# Patient Record
Sex: Female | Born: 1960
Health system: Southern US, Community
[De-identification: ages and names within clinical notes are randomized; demographics above are authoritative.]

## PROBLEM LIST (undated history)

## (undated) ENCOUNTER — Emergency Department (HOSPITAL_COMMUNITY): Admission: EM | Payer: BC Managed Care – PPO | Source: Home / Self Care

## (undated) DIAGNOSIS — F419 Anxiety disorder, unspecified: Secondary | ICD-10-CM

## (undated) DIAGNOSIS — F32A Depression, unspecified: Secondary | ICD-10-CM

## (undated) DIAGNOSIS — E785 Hyperlipidemia, unspecified: Secondary | ICD-10-CM

## (undated) DIAGNOSIS — I10 Essential (primary) hypertension: Secondary | ICD-10-CM

## (undated) DIAGNOSIS — N189 Chronic kidney disease, unspecified: Secondary | ICD-10-CM

## (undated) HISTORY — DX: Essential (primary) hypertension: I10

## (undated) HISTORY — PX: OOPHORECTOMY: SHX86

## (undated) HISTORY — PX: CARPAL TUNNEL RELEASE: SHX101

## (undated) HISTORY — DX: Chronic kidney disease, unspecified: N18.9

## (undated) HISTORY — PX: FOOT SURGERY: SHX648

## (undated) HISTORY — DX: Anxiety disorder, unspecified: F41.9

## (undated) HISTORY — PX: APPENDECTOMY: SHX54

## (undated) HISTORY — DX: Hyperlipidemia, unspecified: E78.5

---

## 1998-10-31 ENCOUNTER — Observation Stay (HOSPITAL_COMMUNITY): Admission: RE | Admit: 1998-10-31 | Discharge: 1998-11-01 | Payer: Self-pay | Admitting: Obstetrics and Gynecology

## 1998-10-31 ENCOUNTER — Encounter (INDEPENDENT_AMBULATORY_CARE_PROVIDER_SITE_OTHER): Payer: Self-pay

## 1999-10-29 ENCOUNTER — Other Ambulatory Visit: Admission: RE | Admit: 1999-10-29 | Discharge: 1999-10-29 | Payer: Self-pay | Admitting: Obstetrics and Gynecology

## 2000-11-03 ENCOUNTER — Other Ambulatory Visit: Admission: RE | Admit: 2000-11-03 | Discharge: 2000-11-03 | Payer: Self-pay | Admitting: Obstetrics and Gynecology

## 2000-12-30 ENCOUNTER — Ambulatory Visit (HOSPITAL_COMMUNITY): Admission: RE | Admit: 2000-12-30 | Discharge: 2000-12-30 | Payer: Self-pay | Admitting: Obstetrics and Gynecology

## 2000-12-30 ENCOUNTER — Encounter (INDEPENDENT_AMBULATORY_CARE_PROVIDER_SITE_OTHER): Payer: Self-pay | Admitting: Specialist

## 2001-11-10 ENCOUNTER — Other Ambulatory Visit: Admission: RE | Admit: 2001-11-10 | Discharge: 2001-11-10 | Payer: Self-pay | Admitting: Obstetrics and Gynecology

## 2002-11-16 ENCOUNTER — Other Ambulatory Visit: Admission: RE | Admit: 2002-11-16 | Discharge: 2002-11-16 | Payer: Self-pay | Admitting: Obstetrics and Gynecology

## 2004-01-10 ENCOUNTER — Other Ambulatory Visit: Admission: RE | Admit: 2004-01-10 | Discharge: 2004-01-10 | Payer: Self-pay | Admitting: Obstetrics and Gynecology

## 2004-12-28 ENCOUNTER — Ambulatory Visit (HOSPITAL_COMMUNITY): Admission: RE | Admit: 2004-12-28 | Discharge: 2004-12-28 | Payer: Self-pay | Admitting: Obstetrics and Gynecology

## 2005-06-17 ENCOUNTER — Other Ambulatory Visit: Admission: RE | Admit: 2005-06-17 | Discharge: 2005-06-17 | Payer: Self-pay | Admitting: Obstetrics and Gynecology

## 2006-06-16 ENCOUNTER — Other Ambulatory Visit: Admission: RE | Admit: 2006-06-16 | Discharge: 2006-06-16 | Payer: Self-pay | Admitting: Obstetrics and Gynecology

## 2006-07-04 ENCOUNTER — Ambulatory Visit: Payer: Self-pay | Admitting: Internal Medicine

## 2006-07-04 DIAGNOSIS — R030 Elevated blood-pressure reading, without diagnosis of hypertension: Secondary | ICD-10-CM | POA: Insufficient documentation

## 2006-07-04 DIAGNOSIS — G43909 Migraine, unspecified, not intractable, without status migrainosus: Secondary | ICD-10-CM | POA: Insufficient documentation

## 2006-08-04 ENCOUNTER — Ambulatory Visit: Payer: Self-pay | Admitting: Internal Medicine

## 2006-08-07 LAB — CONVERTED CEMR LAB
BUN: 11 mg/dL (ref 6–23)
Basophils Absolute: 0 10*3/uL (ref 0.0–0.1)
Basophils Relative: 0.4 % (ref 0.0–1.0)
CO2: 29 meq/L (ref 19–32)
Calcium: 9.3 mg/dL (ref 8.4–10.5)
Chloride: 110 meq/L (ref 96–112)
Cholesterol: 221 mg/dL (ref 0–200)
Creatinine, Ser: 1 mg/dL (ref 0.4–1.2)
Direct LDL: 150.7 mg/dL
Eosinophils Absolute: 0.3 10*3/uL (ref 0.0–0.6)
Eosinophils Relative: 5 % (ref 0.0–5.0)
GFR calc Af Amer: 77 mL/min
GFR calc non Af Amer: 63 mL/min
Glucose, Bld: 100 mg/dL — ABNORMAL HIGH (ref 70–99)
HCT: 41.7 % (ref 36.0–46.0)
HDL: 51.1 mg/dL (ref >39.0)
Hemoglobin: 13.8 g/dL (ref 12.0–15.0)
Lymphocytes Relative: 33.4 % (ref 12.0–46.0)
MCHC: 33.2 g/dL (ref 30.0–36.0)
MCV: 93.9 fL (ref 78.0–100.0)
Monocytes Absolute: 0.4 10*3/uL (ref 0.2–0.7)
Monocytes Relative: 7.6 % (ref 3.0–11.0)
Neutro Abs: 3.2 10*3/uL (ref 1.4–7.7)
Neutrophils Relative %: 53.6 % (ref 43.0–77.0)
Platelets: 337 10*3/uL (ref 150–400)
Potassium: 4.4 meq/L (ref 3.5–5.1)
RBC: 4.44 M/uL (ref 3.87–5.11)
RDW: 11.6 % (ref 11.5–14.6)
Sodium: 140 meq/L (ref 135–145)
TSH: 2.18 microintl units/mL (ref 0.35–5.50)
Total CHOL/HDL Ratio: 4.3
Triglycerides: 144 mg/dL (ref 0–149)
VLDL: 29 mg/dL (ref 0–40)
WBC: 5.9 10*3/uL (ref 4.5–10.5)

## 2007-07-01 ENCOUNTER — Ambulatory Visit: Payer: Self-pay | Admitting: Internal Medicine

## 2007-07-01 DIAGNOSIS — R3589 Other polyuria: Secondary | ICD-10-CM | POA: Insufficient documentation

## 2007-07-01 DIAGNOSIS — M549 Dorsalgia, unspecified: Secondary | ICD-10-CM | POA: Insufficient documentation

## 2007-07-01 DIAGNOSIS — R358 Other polyuria: Secondary | ICD-10-CM

## 2007-07-01 LAB — CONVERTED CEMR LAB
Bilirubin Urine: NEGATIVE
Blood in Urine, dipstick: NEGATIVE
Glucose, Urine, Semiquant: NEGATIVE
Ketones, urine, test strip: NEGATIVE
Nitrite: NEGATIVE
Protein, U semiquant: NEGATIVE
Specific Gravity, Urine: 1.01
Urobilinogen, UA: 0.2
WBC Urine, dipstick: NEGATIVE
pH: 6

## 2007-07-02 ENCOUNTER — Ambulatory Visit: Payer: Self-pay | Admitting: Internal Medicine

## 2007-07-03 LAB — CONVERTED CEMR LAB
BUN: 12 mg/dL (ref 6–23)
Basophils Absolute: 0 10*3/uL (ref 0.0–0.1)
Basophils Relative: 0.2 % (ref 0.0–1.0)
CO2: 29 meq/L (ref 19–32)
Calcium: 9 mg/dL (ref 8.4–10.5)
Chloride: 100 meq/L (ref 96–112)
Cholesterol: 206 mg/dL (ref 0–200)
Creatinine, Ser: 1.1 mg/dL (ref 0.4–1.2)
Direct LDL: 121.7 mg/dL
Eosinophils Absolute: 0.7 10*3/uL (ref 0.0–0.7)
Eosinophils Relative: 3.9 % (ref 0.0–5.0)
GFR calc Af Amer: 69 mL/min
GFR calc non Af Amer: 57 mL/min
Glucose, Bld: 84 mg/dL (ref 70–99)
HCT: 44.4 % (ref 36.0–46.0)
HDL: 56.6 mg/dL (ref >39.0)
Hemoglobin: 15.4 g/dL — ABNORMAL HIGH (ref 12.0–15.0)
Hgb A1c MFr Bld: 5.5 % (ref 4.6–6.0)
Lymphocytes Relative: 23.6 % (ref 12.0–46.0)
MCHC: 34.8 g/dL (ref 30.0–36.0)
MCV: 92.7 fL (ref 78.0–100.0)
Monocytes Absolute: 1.6 10*3/uL — ABNORMAL HIGH (ref 0.1–1.0)
Monocytes Relative: 9.3 % (ref 3.0–12.0)
Neutro Abs: 10.5 10*3/uL — ABNORMAL HIGH (ref 1.4–7.7)
Neutrophils Relative %: 63 % (ref 43.0–77.0)
Platelets: 358 10*3/uL (ref 150–400)
Potassium: 3.7 meq/L (ref 3.5–5.1)
RBC: 4.79 M/uL (ref 3.87–5.11)
RDW: 11.9 % (ref 11.5–14.6)
Sodium: 138 meq/L (ref 135–145)
TSH: 3.37 microintl units/mL (ref 0.35–5.50)
Total CHOL/HDL Ratio: 3.6
Triglycerides: 152 mg/dL — ABNORMAL HIGH (ref 0–149)
VLDL: 30 mg/dL (ref 0–40)
WBC: 16.8 10*3/uL — ABNORMAL HIGH (ref 4.5–10.5)

## 2007-07-20 ENCOUNTER — Telehealth (INDEPENDENT_AMBULATORY_CARE_PROVIDER_SITE_OTHER): Payer: Self-pay | Admitting: *Deleted

## 2007-07-24 ENCOUNTER — Encounter (INDEPENDENT_AMBULATORY_CARE_PROVIDER_SITE_OTHER): Payer: Self-pay | Admitting: *Deleted

## 2007-08-13 ENCOUNTER — Ambulatory Visit: Payer: Self-pay | Admitting: Internal Medicine

## 2007-08-13 DIAGNOSIS — D72829 Elevated white blood cell count, unspecified: Secondary | ICD-10-CM | POA: Insufficient documentation

## 2007-08-13 DIAGNOSIS — M6530 Trigger finger, unspecified finger: Secondary | ICD-10-CM | POA: Insufficient documentation

## 2007-08-14 LAB — CONVERTED CEMR LAB
Basophils Absolute: 0 10*3/uL (ref 0.0–0.1)
Basophils Relative: 0.6 % (ref 0.0–3.0)
Eosinophils Absolute: 0.1 10*3/uL (ref 0.0–0.7)
Eosinophils Relative: 2 % (ref 0.0–5.0)
HCT: 40.3 % (ref 36.0–46.0)
Hemoglobin: 13.9 g/dL (ref 12.0–15.0)
Lymphocytes Relative: 34.3 % (ref 12.0–46.0)
MCHC: 34.4 g/dL (ref 30.0–36.0)
MCV: 91.7 fL (ref 78.0–100.0)
Monocytes Absolute: 0.4 10*3/uL (ref 0.1–1.0)
Monocytes Relative: 7.8 % (ref 3.0–12.0)
Neutro Abs: 3 10*3/uL (ref 1.4–7.7)
Neutrophils Relative %: 55.3 % (ref 43.0–77.0)
Platelets: 287 10*3/uL (ref 150–400)
RBC: 4.39 M/uL (ref 3.87–5.11)
RDW: 12.1 % (ref 11.5–14.6)
WBC: 5.4 10*3/uL (ref 4.5–10.5)

## 2007-08-27 ENCOUNTER — Emergency Department (HOSPITAL_COMMUNITY): Admission: EM | Admit: 2007-08-27 | Discharge: 2007-08-28 | Payer: Self-pay | Admitting: Emergency Medicine

## 2007-08-28 ENCOUNTER — Ambulatory Visit: Payer: Self-pay | Admitting: Family Medicine

## 2007-08-28 DIAGNOSIS — F4320 Adjustment disorder, unspecified: Secondary | ICD-10-CM | POA: Insufficient documentation

## 2007-08-28 DIAGNOSIS — I10 Essential (primary) hypertension: Secondary | ICD-10-CM | POA: Insufficient documentation

## 2007-09-02 ENCOUNTER — Ambulatory Visit: Payer: Self-pay | Admitting: Internal Medicine

## 2007-09-04 ENCOUNTER — Encounter (INDEPENDENT_AMBULATORY_CARE_PROVIDER_SITE_OTHER): Payer: Self-pay | Admitting: *Deleted

## 2007-09-04 ENCOUNTER — Ambulatory Visit: Payer: Self-pay | Admitting: Internal Medicine

## 2007-09-04 DIAGNOSIS — R002 Palpitations: Secondary | ICD-10-CM | POA: Insufficient documentation

## 2007-09-04 LAB — CONVERTED CEMR LAB: Hemoglobin: 15.4 g/dL

## 2007-09-07 ENCOUNTER — Ambulatory Visit: Payer: Self-pay | Admitting: Internal Medicine

## 2007-09-07 DIAGNOSIS — F411 Generalized anxiety disorder: Secondary | ICD-10-CM | POA: Insufficient documentation

## 2007-09-08 ENCOUNTER — Encounter (INDEPENDENT_AMBULATORY_CARE_PROVIDER_SITE_OTHER): Payer: Self-pay | Admitting: *Deleted

## 2007-09-09 ENCOUNTER — Encounter: Payer: Self-pay | Admitting: Internal Medicine

## 2007-09-09 ENCOUNTER — Ambulatory Visit: Payer: Self-pay

## 2007-09-11 ENCOUNTER — Encounter (INDEPENDENT_AMBULATORY_CARE_PROVIDER_SITE_OTHER): Payer: Self-pay | Admitting: *Deleted

## 2007-09-14 ENCOUNTER — Telehealth (INDEPENDENT_AMBULATORY_CARE_PROVIDER_SITE_OTHER): Payer: Self-pay | Admitting: *Deleted

## 2007-09-17 ENCOUNTER — Telehealth (INDEPENDENT_AMBULATORY_CARE_PROVIDER_SITE_OTHER): Payer: Self-pay | Admitting: *Deleted

## 2007-09-24 ENCOUNTER — Ambulatory Visit: Payer: Self-pay

## 2007-09-24 ENCOUNTER — Encounter: Payer: Self-pay | Admitting: Internal Medicine

## 2007-09-25 ENCOUNTER — Telehealth: Payer: Self-pay | Admitting: Internal Medicine

## 2007-09-30 ENCOUNTER — Ambulatory Visit: Payer: Self-pay | Admitting: Internal Medicine

## 2007-10-02 ENCOUNTER — Telehealth (INDEPENDENT_AMBULATORY_CARE_PROVIDER_SITE_OTHER): Payer: Self-pay | Admitting: *Deleted

## 2007-10-12 ENCOUNTER — Ambulatory Visit: Payer: Self-pay | Admitting: Internal Medicine

## 2007-10-12 DIAGNOSIS — R519 Headache, unspecified: Secondary | ICD-10-CM | POA: Insufficient documentation

## 2007-10-12 DIAGNOSIS — R51 Headache: Secondary | ICD-10-CM | POA: Insufficient documentation

## 2007-10-16 LAB — CONVERTED CEMR LAB
BUN: 10 mg/dL (ref 6–23)
CO2: 25 meq/L (ref 19–32)
Calcium: 9.1 mg/dL (ref 8.4–10.5)
Chloride: 112 meq/L (ref 96–112)
Creatinine, Ser: 0.9 mg/dL (ref 0.4–1.2)
GFR calc Af Amer: 86 mL/min
GFR calc non Af Amer: 71 mL/min
Glucose, Bld: 89 mg/dL (ref 70–99)
Potassium: 4.7 meq/L (ref 3.5–5.1)
Sodium: 141 meq/L (ref 135–145)

## 2007-12-02 ENCOUNTER — Telehealth (INDEPENDENT_AMBULATORY_CARE_PROVIDER_SITE_OTHER): Payer: Self-pay | Admitting: *Deleted

## 2008-01-25 ENCOUNTER — Ambulatory Visit: Payer: Self-pay | Admitting: Internal Medicine

## 2008-02-23 ENCOUNTER — Telehealth: Payer: Self-pay | Admitting: Internal Medicine

## 2008-03-07 ENCOUNTER — Ambulatory Visit: Payer: Self-pay | Admitting: Internal Medicine

## 2008-05-25 ENCOUNTER — Ambulatory Visit: Payer: Self-pay | Admitting: Internal Medicine

## 2008-05-30 LAB — CONVERTED CEMR LAB
ALT: 17 units/L (ref 0–35)
AST: 21 units/L (ref 0–37)
Albumin: 3.6 g/dL (ref 3.5–5.2)
Alkaline Phosphatase: 73 units/L (ref 39–117)
BUN: 12 mg/dL (ref 6–23)
Bilirubin, Direct: 0.1 mg/dL (ref 0.0–0.3)
CO2: 28 meq/L (ref 19–32)
Calcium: 8.9 mg/dL (ref 8.4–10.5)
Chloride: 109 meq/L (ref 96–112)
Cholesterol: 142 mg/dL (ref 0–200)
Creatinine, Ser: 0.9 mg/dL (ref 0.4–1.2)
GFR calc non Af Amer: 71.06 mL/min (ref >60)
Glucose, Bld: 74 mg/dL (ref 70–99)
HDL: 31.7 mg/dL — ABNORMAL LOW (ref >39.00)
LDL Cholesterol: 97 mg/dL (ref 0–99)
Potassium: 4.1 meq/L (ref 3.5–5.1)
Sodium: 140 meq/L (ref 135–145)
TSH: 1.98 microintl units/mL (ref 0.35–5.50)
Total Bilirubin: 0.8 mg/dL (ref 0.3–1.2)
Total CHOL/HDL Ratio: 4
Total Protein: 6.8 g/dL (ref 6.0–8.3)
Triglycerides: 69 mg/dL (ref 0.0–149.0)
VLDL: 13.8 mg/dL (ref 0.0–40.0)

## 2008-10-06 ENCOUNTER — Ambulatory Visit: Payer: Self-pay | Admitting: Internal Medicine

## 2008-10-10 ENCOUNTER — Telehealth: Payer: Self-pay | Admitting: Internal Medicine

## 2008-10-19 ENCOUNTER — Telehealth (INDEPENDENT_AMBULATORY_CARE_PROVIDER_SITE_OTHER): Payer: Self-pay | Admitting: *Deleted

## 2008-10-26 ENCOUNTER — Telehealth: Payer: Self-pay | Admitting: Internal Medicine

## 2008-12-20 ENCOUNTER — Telehealth: Payer: Self-pay | Admitting: Internal Medicine

## 2009-01-13 ENCOUNTER — Ambulatory Visit: Payer: Self-pay | Admitting: Internal Medicine

## 2009-01-19 ENCOUNTER — Telehealth (INDEPENDENT_AMBULATORY_CARE_PROVIDER_SITE_OTHER): Payer: Self-pay | Admitting: *Deleted

## 2009-01-24 ENCOUNTER — Encounter: Payer: Self-pay | Admitting: Internal Medicine

## 2010-01-26 ENCOUNTER — Encounter
Admission: RE | Admit: 2010-01-26 | Discharge: 2010-01-26 | Payer: Self-pay | Source: Home / Self Care | Attending: Orthopedic Surgery | Admitting: Orthopedic Surgery

## 2010-02-25 LAB — CONVERTED CEMR LAB: Pap Smear: NORMAL

## 2010-02-27 NOTE — Progress Notes (Signed)
Summary: side effects of BP med//Amanda [name]  Phone Note Outgoing Call Call back at Home Phone [phone]   Call placed to: Patient Details for Reason: Patient left message on VM: wants to go off Clonidine because of side effects,dry mouth, weight gain, the patch leaves red spots. Stopped a couple of weeks ago, just taking metoprolol and felopdipine. Is there another ace inhibitor I can try? Lisinopril gave me the dry cough  Follow-up for Phone Call        apparently she used Cozaar before, and caused cough we could try micardis 40 mg start with half tablet daily, and if the blood pressure is > 120/80, then take one tablet daily. Provide samples.  It is a branded, medication and will be more expensive than lisinopril , but she has   few options office visit in 3 weeks Jose E. Paz MD  December 20, 2008 3:05 PM   Additional Follow-up for Phone Call Additional follow up Details #1::        discussed with pt; samples provided Additional Follow-up by: Shary Decamp,  December 20, 2008 4:00 PM    New/Updated Medications: MICARDIS 40 MG TABS (TELMISARTAN) 1/2 tablet daily, increase to 1 tablet if bp >120/80

## 2010-02-27 NOTE — Progress Notes (Signed)
Summary: --REFILL Alprazolam rx --paz  Phone Note Refill Request   Refills Requested: Medication #1:  ALPRAZOLAM 0.25 MG  TABS 1 by mouth three times a day as needed   Dosage confirmed as above?[facility] [phone] [phone]  Initial call taken by: Freddy Jaksch,  September 17, 2007 10:11 AM  Follow-up for Phone Call        Spoke with pt informed per Dr Laury Axon this rx was given to her for 1 time for her dental procedure, if having anxiety issues need to followup with Dr Drue Novel.  Per pt she said she was having anxiety due to her BP and was told by Dr Drue Novel to continue taking one of the Alprazolam for anxiety at last ov,  09/07/07.  Informed pt will need to ask Dr Drue Novel.    09/07/07 anxiety was discussed Dr Drue Novel, but pt was started on Celexa please advise on Alprazolam request  .Kandice Hams  September 17, 2007 10:53 AM  Follow-up by: Kandice Hams,  September 17, 2007 10:53 AM  Additional Follow-up for Phone Call Additional follow up Details #1::        ok call xanax #60, no RF Jose E. Paz MD  September 18, 2007 5:37 AM     Additional Follow-up for Phone Call Additional follow up Details #2::    [facility] informed.Kandice Hams  September 18, 2007 8:16 AM  Follow-up by: Kandice Hams,  September 18, 2007 8:16 AM  Prescriptions: ALPRAZOLAM 0.25 MG  TABS (ALPRAZOLAM) 1 by mouth three times a day as needed  #60 x 0   Entered by:   Kandice Hams   Authorized by:   Nolon Rod. Paz MD   Signed by:   Kandice Hams on 09/18/2007   Method used:   Printed then faxed to ...       [facility] [phone]* (retail)       [address]       [place], [place]  [address]       Ph: [phone]       Fax: [phone]   RxID:   [id number]    Prescriptions: ALPRAZOLAM 0.25 MG  TABS (ALPRAZOLAM) 1 by mouth three times a day as needed  #60 x 0   Entered by:   Kandice Hams   Authorized by:   Nolon Rod. Paz MD   Signed by:   Kandice Hams on 09/18/2007  Method used:   Printed then faxed to ...       [facility] [phone]* (retail)       [address]       [place], [place]  [address]       Ph: [phone]       Fax: [phone]   RxID:   [id number]

## 2010-02-27 NOTE — Progress Notes (Signed)
Summary: bp elevated  Phone Note Call from Patient Call back at Home Phone [phone]   Caller: Patient Reason for Call: Talk to Nurse Summary of Call: Dr. Drue Novel 160/100 bp readings are still high, Patient wanted to know what she should do? At times patient is having sob but it goes away. Amanda [name] is not having any headaches or dizziness.  Initial call taken by: Charolette Child,  September 14, 2007 11:59 AM  Follow-up for Phone Call        add lisinopril 20mg  1 a day #30, no RF arrange for a renal artery u/s @ a [facility] [place] [facility] Dx persistent HTN f/u as planned (will need BMP) Also: her  Holter is essentially normal (scanned) Jose E. Paz MD  September 14, 2007 1:19 PM   Additional Follow-up for Phone Call Additional follow up Details #1::        discussed with patient u/s already scheduled advised patient to call in a few days to let us know how she is feeling Additional Follow-up by: Shary Decamp,  September 14, 2007 2:17 PM    New/Updated Medications: LISINOPRIL 20 MG  TABS (LISINOPRIL) 1 by mouth once daily   Prescriptions: LISINOPRIL 20 MG  TABS (LISINOPRIL) 1 by mouth once daily  #30 x 0   Entered by:   Shary Decamp   Authorized by:   Nolon Rod. Paz MD   Signed by:   Shary Decamp on 09/14/2007   Method used:   Electronically sent to ...       [facility] [phone]*       [address]       [place], [place]  [address]       Ph: [phone]       Fax: [phone]   RxID:   [id number]

## 2010-02-27 NOTE — Assessment & Plan Note (Signed)
Summary: NEW//TL   Vital Signs:  Patient Profile:   50 Years Old Female Height:     61.5 inches Weight:      136.6 pounds Pulse rate:   80 / minute Pulse rhythm:   regular BP sitting:   132 / 90  (left arm) Cuff size:   regular  Vitals Entered By: Shary Decamp (July 04, 2006 2:25 PM)             Is Patient Diabetic? No   Chief Complaint:  NEW PT - went to GYN few weeks ago bp was elevated - has been on bp med before when she was on BCP - c/o dry cough and ST x few days - feels better today.  History of Present Illness: was on her OB/gyn office a few days ago, and her blood pressure was in the 130/90 range. She was asked to come here for a checkup.  Also for the last week is having dry cough and some sore throat.  7 years ago, when she was on birth control her blood pressure was high, and she took blood pressure medication for a while.  Blood pressure went back to normal after she stop the BCP    Past Medical History:    G0  Past Surgical History:    Appendectomy    Oophorectomy   Family History:    Father: HTN deceased 12 yo from [place]. father had first MI at age 36    Mother: HTN    Siblings: 2 sis HTN    no colon or breast cancer      Social History:    Media planner    1 child adopted from [place]   Risk Factors:  Tobacco use:  current    Year started:  quit 15-20 years ago Passive smoke exposure:  no Drug use:  no Alcohol use:  yes    Comments:  socially  PAP Smear History:     Date of Last PAP Smear:  06/29/2006    Results:  Normal   Mammogram History:     Date of Last Mammogram:  11/28/2005    Results:  Normal Bilateral   Colonoscopy History:     Date of Last Colonoscopy:  01/28/2001    Results:  Normal    Review of Systems       denies any fever or sputum production.  Very little sinus congestion.  Currently taking old prescription for guaifenesin   Physical Exam  General:     alert, well-developed, and well-nourished.     Lungs:     normal respiratory effort, no intercostal retractions, and no accessory muscle use.   Heart:     normal rate, regular rhythm, no murmur, and no gallop.   Abdomen:     no bruit Extremities:     no edema Neurologic:     speech gait, and motor are intact    Impression & Recommendations:  Problem # 1:  ELEVATED BLOOD PRESSURE WITHOUT DIAGNOSIS OF HYPERTENSION (ICD-796.2) EKG showed nonspecific changes. See below Orders: EKG w/ Interpretation (93000)   Problem # 2:  U R I (ICD-465.9)  today in fact, she is feeling better.   Mucinex, and Tylenol. Call next week if she is no better. Antibiotic?  Medications Added to Medication List This Visit: 1)  Maxalt Tabs (Rizatriptan benzoate tabs) .... P.r.n. headaches   Patient Instructions: 1)  It is important that you exercise regularly at least 20 minutes 5 times a week.  If you develop chest pain, have severe difficulty breathing, or feel very tired , stop exercising immediately and seek medical attention. 2)  is very important that you do a low-salt diet. 3)  Please come back in 4 weeks fasting for a follow-up. 4)  At that time, will recheck your blood pressure and blood work. 5)  Would be  very good if you  checked your  blood pressure once a week at the pharmacy.  If it is consistently more than 160/90, please call me to also call me if you have chest pain, nosebleeds, or severe headaches     Tetanus/Td Immunization History:    Tetanus/Td # 1:  Tdap (02/28/2006)

## 2010-02-27 NOTE — Assessment & Plan Note (Signed)
Summary: f/u from ED -- elevated BP/swh   Vital Signs:  Patient Profile:   50 Years Old Female Height:     61.5 inches (156.21 cm) Weight:      138 pounds Temp:     98.4 degrees F oral Pulse rate:   78 / minute Resp:     14 per minute BP sitting:   128 / 90  (right arm)  Pt. in pain?   no  Vitals Entered By: Ardyth Man (August 28, 2007 1:08 PM)                  PCP:  PAZ  Chief Complaint:  BP follow up from ED and .  History of Present Illness:  Hypertension Follow-Up      This is a 50 year old woman who presents for Hypertension follow-up.  The patient denies lightheadedness, urinary frequency, headaches, edema, impotence, rash, and fatigue.  The patient denies the following associated symptoms: chest pain, chest pressure, exercise intolerance, dyspnea, palpitations, syncope, leg edema, and pedal edema.  The patient reports that dietary compliance has been good.  Adjunctive measures currently used by the patient include salt restriction.    Pt is anxious about dental procedure she is having on Monday.    Current Allergies: ! HYDROCODONE-ACETAMINOPHEN (HYDROCODONE-ACETAMINOPHEN) ! AMOXICILLIN  Past Medical History:    Reviewed history from 07/01/2007 and no changes required:       G0       borderline htn       Hypertension   Family History:    Reviewed history from 07/01/2007 and no changes required:       Father: HTN deceased 72 yo from [place]. father had first MI at age 19       Mother: HTN       Siblings: 2 sis HTN, 1 sis DM2       no colon or breast cancer         Social History:    Reviewed history from 07/04/2006 and no changes required:       Domestic Partner       1 child adopted from [place]    Review of Systems      See HPI   Physical Exam  General:     Well-developed,well-nourished,in no acute distress; alert,appropriate and cooperative throughout examination Lungs:     Normal respiratory effort, chest expands symmetrically. Lungs are  clear to auscultation, no crackles or wheezes. Heart:     normal rate, regular rhythm, and no murmur.   Extremities:     No clubbing, cyanosis, edema, or deformity noted with normal full range of motion of all joints.   Psych:     Oriented X3, memory intact for recent and remote, normally interactive, and slightly anxious.      Impression & Recommendations:  Problem # 1:  HYPERTENSION (ICD-401.9)  Her updated medication list for this problem includes:    Hydrochlorothiazide 25 Mg Tabs (Hydrochlorothiazide) .Marland Kitchen... 1 by mouth once daily f/u 1-2 weeks for recheck pt given potassium ho  BP today: 128/90 Prior BP: 140/88 (08/13/2007)  Labs Reviewed: Creat: 1.1 (07/02/2007) Chol: 206 (07/02/2007)   HDL: 56.6 (07/02/2007)   LDL: DEL (07/02/2007)   TG: 152 (07/02/2007)   Problem # 2:  UNSPECIFIED ADJUSTMENT REACTION (ICD-309.9) xanax .25 three times a day as needed -----  take one 1/2 hr before dental procedure  Complete Medication List: 1)  Maxalt Tabs (Rizatriptan benzoate tabs) .... P.r.n. headaches 2)  Clindamycin  Hcl 150 Mg Caps (Clindamycin hcl) .... For oral surgery. 3)  Hydrochlorothiazide 25 Mg Tabs (Hydrochlorothiazide) .Marland Kitchen.. 1 by mouth once daily 4)  Alprazolam 0.25 Mg Tabs (Alprazolam) .Marland Kitchen.. 1 by mouth three times a day as needed    Prescriptions: ALPRAZOLAM 0.25 MG  TABS (ALPRAZOLAM) 1 by mouth three times a day as needed  #30 x 0   Entered and Authorized by:   Loreen Freud DO   Signed by:   Loreen Freud DO on 08/28/2007   Method used:   Print then Give to Patient   RxID:   [id number] HYDROCHLOROTHIAZIDE 25 MG  TABS (HYDROCHLOROTHIAZIDE) 1 by mouth once daily  #30 x 1   Entered and Authorized by:   Loreen Freud DO   Signed by:   Loreen Freud DO on 08/28/2007   Method used:   Electronically sent to ...       [facility] [phone]*       [address]       [place], [place]  [address]       Ph: [phone]       Fax:  [phone]   RxID:   [id number]  ]

## 2010-02-27 NOTE — Assessment & Plan Note (Signed)
Summary: 4weeks//tl   Vital Signs:  Patient Profile:   50 Years Old Female Height:     61.5 inches (156.21 cm) Weight:      138 pounds Pulse rate:   76 / minute Pulse rhythm:   regular BP sitting:   130 / 96  (left arm) Cuff size:   regular  Vitals Entered By: Shary Decamp (August 04, 2006 9:58 AM)               Chief Complaint:  rov; pt fasting; pt checking bp @ home over the past 2 weeks has been- 120-130/76-83 - feels good - has been cutting out salt.  History of Present Illness: feeling well       Review of Systems       blood pressure check at home is always within normal limits she has noticed a big difference by eating less salt she is also exercising more   Physical Exam  General:     alert and well-developed.   Lungs:     normal respiratory effort, no intercostal retractions, and no accessory muscle use.   Heart:     normal rate, regular rhythm, and no murmur.   Extremities:     no edema    Impression & Recommendations:  Problem # 1:  ELEVATED BLOOD PRESSURE WITHOUT DIAGNOSIS OF HYPERTENSION (ICD-796.2)  Orders: TLB-BMP (Basic Metabolic Panel-BMET) (80048-METABOL) TLB-CBC Platelet - w/Differential (85025-CBCD) TLB-TSH (Thyroid Stimulating Hormone) (84443-TSH)   Problem # 2:  FAMILY HISTORY OF CAD FEMALE 1ST DEGREE RELATIVE <50 (ICD-V17.3)  Orders: TLB-Lipid Panel (80061-LIPID)    Patient Instructions: 1)  continue exercising 2)  continue with the low salt  diet 3)  check  blood pressure from time to time, if you notice  that it is consistently over  140/ 90: let us know 4)  otherwise come back once a year

## 2010-02-27 NOTE — Assessment & Plan Note (Signed)
Summary: bp check--tl   Vital Signs:  Patient Profile:   50 Years Old Female Height:     61.5 inches (156.21 cm) Weight:      134.8 pounds Pulse rate:   76 / minute BP sitting:   142 / 110  (left arm)  Vitals Entered By: Doristine Devoid (September 02, 2007 3:56 PM)                 PCP:  PAZ  Chief Complaint:  bp still elevated-since cortisone shot having sob .  History of Present Illness: had a steroid shot at one finger 6 days ago shortly after her BP increased from her usual 140s to 160/100 and even 200/110 was seen at the ER then saw Dr Laury Axon and was started [facility] -- 08-28-07 x 24 hours c/o palpitations, lightheadness, worse when she stands up some SOB    Current Allergies: ! HYDROCODONE-ACETAMINOPHEN (HYDROCODONE-ACETAMINOPHEN) ! AMOXICILLIN  Past Medical History:    Reviewed history from 08/28/2007 and no changes required:       G0       borderline htn       Hypertension     Review of Systems       no CP no LE edema no recent airplane trips had a Rx to amoxicillin 4 days ago (Rx by her dentist) but was switched to clinda due to a reaction --denies wheezing   Physical Exam  General:     alert and well-developed.   Eyes:     not pale Lungs:     normal respiratory effort, no intercostal retractions, no accessory muscle use, and normal breath sounds.   Heart:     normal rate, regular rhythm, no murmur, and no gallop.   Extremities:     no pretibial edema bilaterally  calves symetric and no tender    Impression & Recommendations:  Problem # 1:  HYPERTENSION (ICD-401.9) EKG no acute suspect some of her sx for the last 24 hours may be related to the diuretic (?dehydrated--HR increased, slt orthostatic dizziness) also suspect BP is temporarily higher than usual  due to steroid shot plan:  switch BP med-- will low doses  drink plenty of fluids nurse visit 5 days  The following medications were removed from the medication list:  Hydrochlorothiazide 25 Mg Tabs (Hydrochlorothiazide) .Marland Kitchen... 1 by mouth once daily  Her updated medication list for this problem includes:    Felodipine 2.5 Mg Xr24h-tab (Felodipine) .Marland Kitchen... 1 by mouth once daily    Metoprolol Tartrate 25 Mg Tabs (Metoprolol tartrate) .Marland Kitchen... 1 by mouth two times a day  Orders: EKG w/ Interpretation (93000)   Complete Medication List: 1)  Maxalt Tabs (Rizatriptan benzoate tabs) .... P.r.n. headaches 2)  Clindamycin Hcl 150 Mg Caps (Clindamycin hcl) .... For oral surgery. 3)  Alprazolam 0.25 Mg Tabs (Alprazolam) .Marland Kitchen.. 1 by mouth three times a day as needed 4)  Felodipine 2.5 Mg Xr24h-tab (Felodipine) .Marland Kitchen.. 1 by mouth once daily 5)  Metoprolol Tartrate 25 Mg Tabs (Metoprolol tartrate) .Marland Kitchen.. 1 by mouth two times a day   Patient Instructions: 1)  call with your BP redings in 2 days 2)  ER if your symptoms are worse 3)  Nurse visit in 5 days for BP check   Prescriptions: METOPROLOL TARTRATE 25 MG  TABS (METOPROLOL TARTRATE) 1 by mouth two times a day  #60 x 0   Entered and Authorized by:   Nolon Rod. Paz MD   Signed by:   Elita Quick  Elisabeth Cara MD on 09/02/2007   Method used:   Print then Give to Patient   RxID:   [id number] FELODIPINE 2.5 MG  XR24H-TAB (FELODIPINE) 1 by mouth once daily  #30 x 0   Entered and Authorized by:   Nolon Rod. Paz MD   Signed by:   Nolon Rod. Paz MD on 09/02/2007   Method used:   Print then Give to Patient   RxID:   [id number]  ]

## 2010-02-27 NOTE — Assessment & Plan Note (Signed)
Summary: PER DR PAZ, RETURN ON MONDAY///SPH   Vital Signs:  Patient Profile:   50 Years Old Female Height:     61.5 inches (156.21 cm) Weight:      136.8 pounds Pulse rate:   76 / minute BP sitting:   140 / 100  Vitals Entered By: Shary Decamp (September 07, 2007 9:52 AM)                 PCP:  PAZ  Chief Complaint:  rov - feeling better today; bp over weekend 140/90.  History of Present Illness: rov -  feeling better today ambulatory  bp over weekend 140/90    Prior Medication List:  MAXALT  TABS (RIZATRIPTAN BENZOATE TABS) p.r.n. headaches CLINDAMYCIN HCL 150 MG  CAPS (CLINDAMYCIN HCL) for oral surgery. ALPRAZOLAM 0.25 MG  TABS (ALPRAZOLAM) 1 by mouth three times a day as needed FELODIPINE 2.5 MG  XR24H-TAB (FELODIPINE) 2 by mouth once daily METOPROLOL TARTRATE 25 MG  TABS (METOPROLOL TARTRATE) 2  by mouth two times a day   Current Allergies (reviewed today): ! HYDROCODONE-ACETAMINOPHEN (HYDROCODONE-ACETAMINOPHEN) ! AMOXICILLIN  Past Medical History:    G0    borderline htn    Hypertension    Anxiety     Review of Systems       lighheadness resolved still some palpitations but less than before again denies taking any new meds, no major changes on her diet very anxious, thinks related to the fact that her BP is high, does not believe anxiety is driving her BP problem request medication for anxiety denies depression   Physical Exam  General:     alert and well-developed.   Lungs:     normal respiratory effort, no intercostal retractions, no accessory muscle use, and normal breath sounds.   Heart:     normal rate, regular rhythm, and no murmur.   Extremities:     no pretibial edema bilaterally  Psych:     mod. anxiety    Impression & Recommendations:  Problem # 1:  HYPERTENSION (ICD-401.9) Assessment: Improved increase felodipine  pt is distress by her elevated BP rec: renal u/s to r/o secondary etiologies  Her updated medication list for  this problem includes:    Felodipine 10 Mg Xr24h-tab (Felodipine) .Marland Kitchen... 1 by mouth once daily    Metoprolol Tartrate 25 Mg Tabs (Metoprolol tartrate) .Marland Kitchen... 2  by mouth two times a day  Orders: Cardiology Referral (Cardiology)   Problem # 2:  ANXIETY (ICD-300.00) anxious due to #1 start celexa  Her updated medication list for this problem includes:    Alprazolam 0.25 Mg Tabs (Alprazolam) .Marland Kitchen... 1 by mouth three times a day as needed    Citalopram Hydrobromide 20 Mg Tabs (Citalopram hydrobromide) .Marland Kitchen... 1/2 a day x 1 week, then 1 by mouth once daily   Complete Medication List: 1)  Maxalt Tabs (Rizatriptan benzoate tabs) .... P.r.n. headaches 2)  Clindamycin Hcl 150 Mg Caps (Clindamycin hcl) .... For oral surgery. 3)  Alprazolam 0.25 Mg Tabs (Alprazolam) .Marland Kitchen.. 1 by mouth three times a day as needed 4)  Felodipine 10 Mg Xr24h-tab (Felodipine) .Marland Kitchen.. 1 by mouth once daily 5)  Metoprolol Tartrate 25 Mg Tabs (Metoprolol tartrate) .... 2  by mouth two times a day 6)  Citalopram Hydrobromide 20 Mg Tabs (Citalopram hydrobromide) .... 1/2 a day x 1 week, then 1 by mouth once daily   Patient Instructions: 1)  Please schedule a follow-up appointment in 1 month. 2)  call in 1  week if BP > 140/85   Prescriptions: CITALOPRAM HYDROBROMIDE 20 MG  TABS (CITALOPRAM HYDROBROMIDE) 1/2 a day x 1 week, then 1 by mouth once daily  #30 x 1   Entered and Authorized by:   Elita Quick E. Paz MD   Signed by:   Nolon Rod. Paz MD on 09/07/2007   Method used:   Print then Give to Patient   RxID:   [id number] FELODIPINE 10 MG  XR24H-TAB (FELODIPINE) 1 by mouth once daily  #30 x 3   Entered and Authorized by:   Nolon Rod. Paz MD   Signed by:   Nolon Rod. Paz MD on 09/07/2007   Method used:   Print then Give to Patient   RxID:   [id number]  ]

## 2010-02-27 NOTE — Progress Notes (Signed)
Summary: [name]--REFILL  Phone Note Refill Request   Refills Requested: Medication #1:  ALPRAZOLAM 0.25 MG  TABS 1 or 2 by mouth at bedtime as needed [facility] [phone] [phone]  Initial call taken by: Freddy Jaksch,  December 02, 2007 10:57 AM  Follow-up for Phone Call        last ov 10/12/07, last refill #60 09/18/07 .Kandice Hams  December 02, 2007 4:45 PM  Follow-up by: Kandice Hams,  December 02, 2007 4:45 PM  Additional Follow-up for Phone Call Additional follow up Details #1::        ok 60 and 1 RF Amanda E. Paz MD  December 03, 2007 1:37 PM       Prescriptions: ALPRAZOLAM 0.25 MG  TABS (ALPRAZOLAM) 1 or 2 by mouth at bedtime as needed  #60 x 1   Entered by:   Kandice Hams   Authorized by:   Nolon Rod. Paz MD   Signed by:   Kandice Hams on 12/04/2007   Method used:   Printed then faxed to ...       [facility] [phone]* (retail)       [address]       [place], [place]  [address]       Ph: [phone]       Fax: [phone]   RxID:   [id number]

## 2010-02-27 NOTE — Assessment & Plan Note (Signed)
Summary: one month ov--ph   Vital Signs:  Patient Profile:   50 Years Old Female Height:     61.5 inches (156.21 cm) Weight:      136.2 pounds BMI:     25.41 Pulse rate:   60 / minute BP sitting:   150 / 100  Vitals Entered By: Shary Decamp (October 12, 2007 10:11 AM)                 PCP:  Novali Vollman  Chief Complaint:  rov -- bp @ home last night 111/70.Marland Kitchen..140/85; feeling a lot better.  History of Present Illness: rov -- bp @ home last night 111/70.Marland Kitchen..140/85;  feeling a lot better    Updated Prior Medication List: MAXALT  TABS (RIZATRIPTAN BENZOATE TABS) p.r.n. headaches CLINDAMYCIN HCL 150 MG  CAPS (CLINDAMYCIN HCL) for oral surgery. ALPRAZOLAM 0.25 MG  TABS (ALPRAZOLAM) 1 by mouth three times a day as needed FELODIPINE 10 MG  XR24H-TAB (FELODIPINE) 1 by mouth once daily METOPROLOL TARTRATE 25 MG  TABS (METOPROLOL TARTRATE) 2  by mouth two times a day CITALOPRAM HYDROBROMIDE 20 MG  TABS (CITALOPRAM HYDROBROMIDE) 1 by mouth once daily LISINOPRIL 20 MG  TABS (LISINOPRIL) 1 by mouth once daily  Current Allergies (reviewed today): ! HYDROCODONE-ACETAMINOPHEN (HYDROCODONE-ACETAMINOPHEN) ! AMOXICILLIN  Past Medical History:    Reviewed history from 09/07/2007 and no changes required:       G0       Hypertension       Anxiety       Migraine HA, worse during periods; on maxalt  Past Surgical History:    Reviewed history from 07/04/2006 and no changes required:       Appendectomy       Oophorectomy     Review of Systems       started to feel "all over better" after 2 weeks of citalopram  uses xanax as needed insomnia denies HA,palpitation, tingling-numbness   Physical Exam  General:     alert and well-developed.   Lungs:     normal respiratory effort, no intercostal retractions, no accessory muscle use, and normal breath sounds.   Heart:     normal rate, regular rhythm, and no murmur.   Psych:     not anxious appearing and not depressed appearing.       Impression & Recommendations:  Problem # 1:  HYPERTENSION (ICD-401.9) stable , good ambulatory BPs no change Her updated medication list for this problem includes:    Felodipine 10 Mg Xr24h-tab (Felodipine) .Marland Kitchen... 1 by mouth once daily    Metoprolol Tartrate 25 Mg Tabs (Metoprolol tartrate) .Marland Kitchen... 2  by mouth two times a day    Lisinopril 20 Mg Tabs (Lisinopril) .Marland Kitchen... 1 by mouth once daily  BP today: 150/100 Prior BP: 140/100 (09/07/2007)  Labs Reviewed: Creat: 1.1 (07/02/2007) Chol: 206 (07/02/2007)   HDL: 56.6 (07/02/2007)   LDL: DEL (07/02/2007)   TG: 152 (07/02/2007)  Orders: Venipuncture (10272) TLB-BMP (Basic Metabolic Panel-BMET) (80048-METABOL)   Problem # 2:  PALPITATIONS (ICD-785.1) resolved etiology? anxiety? sx started after a local steroid shot...related? Her updated medication list for this problem includes:    Metoprolol Tartrate 25 Mg Tabs (Metoprolol tartrate) .Marland Kitchen... 2  by mouth two times a day   Problem # 3:  ANXIETY (ICD-300.00) cont present care felt "all over better" 2 weeks after SSRIs ...she may have been more anxious than what we realized. uses xanax for insomnia mostly Her updated medication list for this problem includes:  Alprazolam 0.25 Mg Tabs (Alprazolam) .Marland Kitchen... 1 or 2 by mouth at bedtime as needed    Citalopram Hydrobromide 20 Mg Tabs (Citalopram hydrobromide) .Marland Kitchen... 1 by mouth once daily   Problem # 4:  HEADACHE (ICD-784.0) her gyn used to RF maxalt requested me to do it... Her updated medication list for this problem includes:    Maxalt 10 Mg Tabs (Rizatriptan benzoate) .Marland Kitchen... 1q 2 hours as needed ha,no more than 2 a day    Metoprolol Tartrate 25 Mg Tabs (Metoprolol tartrate) .Marland Kitchen... 2  by mouth two times a day   Complete Medication List: 1)  Maxalt 10 Mg Tabs (Rizatriptan benzoate) .Marland Kitchen.. 1q 2 hours as needed ha,no more than 2 a day 2)  Clindamycin Hcl 150 Mg Caps (Clindamycin hcl) .... For oral surgery. 3)  Alprazolam 0.25 Mg Tabs  (Alprazolam) .Marland Kitchen.. 1 or 2 by mouth at bedtime as needed 4)  Felodipine 10 Mg Xr24h-tab (Felodipine) .Marland Kitchen.. 1 by mouth once daily 5)  Metoprolol Tartrate 25 Mg Tabs (Metoprolol tartrate) .... 2  by mouth two times a day 6)  Citalopram Hydrobromide 20 Mg Tabs (Citalopram hydrobromide) .Marland Kitchen.. 1 by mouth once daily 7)  Lisinopril 20 Mg Tabs (Lisinopril) .Marland Kitchen.. 1 by mouth once daily   Patient Instructions: 1)  Please schedule a follow-up appointment in 3 months.   Prescriptions: MAXALT 10 MG TABS (RIZATRIPTAN BENZOATE) 1q 2 hours as needed HA,no more than 2 a day  #12 x 3   Entered and Authorized by:   Elita Quick E. Ninfa Giannelli MD   Signed by:   Nolon Rod. Aceson Labell MD on 10/12/2007   Method used:   Print then Give to Patient   RxID:   [id number]  ]

## 2010-02-27 NOTE — Progress Notes (Signed)
Summary: no response to pc pt needs appt  Phone Note Outgoing Call   Details for Reason: Needs follow up from last visit & lab work.  Please call & schedule office visit with Dr. Drue Novel ..........Marland KitchenShary Decamp  July 20, 2007 8:45 AM   Follow-up for Phone Call        lm am for patient to schedule appt Follow-up by: Okey Regal [place],  July 21, 2007 10:13 AM  Additional Follow-up for Phone Call Additional follow up Details #1::        no resonpose to pc mailed letter Additional Follow-up by: Okey Regal [place],  July 24, 2007 11:57 AM

## 2010-02-27 NOTE — Progress Notes (Signed)
Summary: elevated bp  Phone Note Call from Patient Call back at Home Phone [phone]   Reason for Call: Insurance Question Summary of Call: PATIENT LEFT MESSAGE ON VM: pt was started on clonidine patches @ last ov -- her bp @ night is 160-170/100; pt likes the patches but does not feel like it is strong enough Shary Decamp  October 10, 2008 2:33 PM   Follow-up for Phone Call        since she already has the patches, use two patches this week, call w/ readings in few days Jose E. Paz MD  October 10, 2008 3:06 PM   discussed with pt Shary Decamp  October 10, 2008 4:30 PM

## 2010-02-27 NOTE — Letter (Signed)
Summary: Results Follow up Letter  [facility] [place]  [address] [place], [place] [address]   Phone: [phone]  Fax: [phone]    09/11/2007 MRN: [id number]  [facility] [address] [place], [place]  [address]  Dear Ms. Eckhart,  The following are the results of your recent test(s):  Test         Result    Pap Smear:        Normal _____  Not Normal _____ Comments: ______________________________________________________ Cholesterol: LDL(Bad cholesterol):         Your goal is less than:         HDL (Good cholesterol):       Your goal is more than: Comments:  ______________________________________________________ Mammogram:        Normal _____  Not Normal _____ Comments:  ___________________________________________________________________ Hemoccult:        Normal _____  Not normal _______ Comments:    _____________________________________________________________________ Other Tests:  YOUR ECHO WAS NORMAL. PLEASE FOLLOW UP WITH DR PAZ IN 1 MONTH.  We routinely do not discuss normal results over the telephone.  If you desire a copy of the results, or you have any questions about this information we can discuss them at your next office visit.   Sincerely,

## 2010-02-27 NOTE — Assessment & Plan Note (Signed)
Summary: ROA//VGJ   Vital Signs:  Patient Profile:   50 Years Old Female Height:     61.5 inches (156.21 cm) Weight:      139.2 pounds Pulse rate:   74 / minute BP sitting:   140 / 88  Vitals Entered By: Shary Decamp (August 13, 2007 9:04 AM)             Comments -follow up from last visit -- still with low back pain -- WBC was elevated -left thumb pain-- had cortisone shot @ [facility] 2 months ago -c/o of knot above left ankle - no injury ................Marland KitchenShary Decamp  August 13, 2007 9:04 AM      Chief Complaint:  SEE COMMENTS.  History of Present Illness: follow up from last visit  -- still with low back pain, better, hurt mostly at night, worse w/ bending, no radiation no fever, rash  -- WBC was elevated --still urinates "frecuent" and "lots" of urine takes 1 or 2 cups of coffee no dysuria or hematuria -left thumb pain, trigger finger;  had cortisone shot @ [facility] 2 months ago -c/o of knot above R  ankle  x months, slt sore ,  no injury    Updated Prior Medication List: MAXALT  TABS (RIZATRIPTAN BENZOATE TABS) p.r.n. headaches  Current Allergies (reviewed today): ! HYDROCODONE-ACETAMINOPHEN (HYDROCODONE-ACETAMINOPHEN)  Past Medical History:    Reviewed history from 07/01/2007 and no changes required:       G0       borderline htn     Review of Systems      See HPI   Physical Exam  General:     alert and well-developed.   Msk:     no tender at low back Extremities:     no pretibial edema bilaterally  area of concern above R ankle: has a 1cm of slt tenderness and swelling w/o mass. Skin is normal  mild trigger finger at L thumb Neurologic:     strength normal in all extremities, gait normal, and DTRs symmetrical and normal.      Impression & Recommendations:  Problem # 1:  LEUKOCYTOSIS UNSPECIFIED (ICD-288.60) likely related to previous steroid injection repeat CBC Orders: TLB-CBC Platelet - w/Differential (85025-CBCD) Venipuncture  (19147)   Problem # 2:  POLYURIA (ICD-788.42) BMP, A1C, UA were neg if sx persist,trial w/ vesicare  Problem # 3:  TRIGGER FINGER (ICD-727.03) to ortho Orders: Orthopedic Surgeon Referral (Ortho Surgeon)   Problem # 4:  pain at R leg observe, call if no better  Problem # 5:  BACK PAIN, ACUTE (ICD-724.5) resolving per pt The following medications were removed from the medication list:    Flexeril 10 Mg Tabs (Cyclobenzaprine hcl) .Marland Kitchen... 1/2 to 1 tab by mouth q6hours as needed pain or spasm   Complete Medication List: 1)  Maxalt Tabs (Rizatriptan benzoate tabs) .... P.r.n. headaches   Patient Instructions: 1)  Please schedule a follow-up appointment in 6 months (BP check)   ]

## 2010-02-27 NOTE — Letter (Signed)
Summary: Primary Care Consult Scheduled Letter  [facility] [place]  [address] [place], [place] [address]   Phone: [phone]  Fax: [phone]      09/04/2007 MRN: [id number]  [facility] [address] [place], [place]  [address]    Dear Ms. Coriz,      We have scheduled an appointment for you.  At the recommendation of Dr.Paz, we have scheduled you an Echo at [facility] on August 12th at 1:00pm.  Their address is [address] [place]. [address]. The office phone number is [phone].  If this appointment day and time is not convenient for you, please feel free to call the office of the doctor you are being referred to at the number listed above and reschedule the appointment.     It is important for you to keep your scheduled appointments. We are here to make sure you are given good patient care. If you have questions or you have made changes to your appointment, please notify us at  [phone], ask for Tiffany.    Thank you,  Patient Care Coordinator [facility]

## 2010-02-27 NOTE — Letter (Signed)
Summary: Request for Medical Information/[facility] [employer]  Request for Medical Information/[facility] [place]  [place]   Imported By: Lanelle Bal 10/14/2007 12:46:02  _____________________________________________________________________  External Attachment:    Type:   Image     Comment:   External Document

## 2010-02-27 NOTE — Assessment & Plan Note (Signed)
Summary: bp still up.cbs   Vital Signs:  Patient Profile:   50 Years Old Female Height:     61.5 inches (156.21 cm) Weight:      137 pounds O2 Sat:      98 % Pulse rate:   72 / minute BP sitting:   152 / 108  (left arm)  Vitals Entered By: Doristine Devoid (September 04, 2007 10:43 AM)                 Serial Vital Signs/Assessments:  Time      Position  BP       Pulse  Resp  Temp     By 10:43 AM  R Arm     150/100                        Doristine Devoid   PCP:  PAZ  Chief Complaint:  BP still elevated was 172/128 heartrate racing at night awoke from sleep took xanax to help .  History of Present Illness: since the last OV... BP still elevated was 172/128 x 1  Heart racing at night awoke from sleep took xanax to help  overall feels about the same , still some dizzy  thinks is pale    Current Allergies: ! HYDROCODONE-ACETAMINOPHEN (HYDROCODONE-ACETAMINOPHEN) ! AMOXICILLIN  Past Medical History:    Reviewed history from 08/28/2007 and no changes required:       G0       borderline htn       Hypertension     Review of Systems       denies stress in particular , just anxious b/c her BP is up ambulatory BPs 174/128 x 1 time since last OV still some tinnitus again no CP, no leg edema-pain, no recent airplane trips denies HAs   Physical Exam  General:     alert and well-developed.   Eyes:     not pale Lungs:     normal respiratory effort, no intercostal retractions, no accessory muscle use, and normal breath sounds.   Heart:     normal rate, regular rhythm, no murmur, and no gallop.   Extremities:     no pretibial edema bilaterally  calves non-tender symetric    Impression & Recommendations:  Problem # 1:  PALPITATIONS (ICD-785.1) Hg today 15.4 labs 6-09: Nl TSH, BMP  schedule a ECHO and Holter anxiety may be playing a role but she is anxious mostly b/c her elevated BP Her updated medication list for this problem includes: see instructions   Metoprolol Tartrate 25 Mg Tabs (Metoprolol tartrate) .Marland Kitchen... 2  by mouth two times a day  Her updated medication list for this problem includes:    Metoprolol Tartrate 25 Mg Tabs (Metoprolol tartrate) .Marland Kitchen... 2  by mouth two times a day  Orders: Hgb (93235) Cardiology Referral (Cardiology) Cardiology Referral (Cardiology)   Problem # 2:  HYPERTENSION (ICD-401.9) increase meds medication list for this problem includes:    Felodipine 2.5 Mg Xr24h-tab (Felodipine) .Marland Kitchen... 1 by mouth once daily    Metoprolol Tartrate 25 Mg Tabs (Metoprolol tartrate) .Marland Kitchen... 1 by mouth two times a day  BP today: 152/108 Prior BP: 142/110 (09/02/2007)  Labs Reviewed: Creat: 1.1 (07/02/2007) Chol: 206 (07/02/2007)   HDL: 56.6 (07/02/2007)   LDL: DEL (07/02/2007)   TG: 152 (07/02/2007)  Her updated medication list for this problem includes:    Felodipine 2.5 Mg Xr24h-tab (Felodipine) .Marland Kitchen... 2 by mouth once daily  Metoprolol Tartrate 25 Mg Tabs (Metoprolol tartrate) .Marland Kitchen... 2  by mouth two times a day   Complete Medication List: 1)  Maxalt Tabs (Rizatriptan benzoate tabs) .... P.r.n. headaches 2)  Clindamycin Hcl 150 Mg Caps (Clindamycin hcl) .... For oral surgery. 3)  Alprazolam 0.25 Mg Tabs (Alprazolam) .Marland Kitchen.. 1 by mouth three times a day as needed 4)  Felodipine 2.5 Mg Xr24h-tab (Felodipine) .... 2 by mouth once daily 5)  Metoprolol Tartrate 25 Mg Tabs (Metoprolol tartrate) .... 2  by mouth two times a day   Patient Instructions: 1)  ER if symptoms increase 2)  take one xanax before bedtime until next OV 3)  Came back Monday   ] Laboratory Results   CBC   HGB:  15.4 g/dL   (Normal Range: 54.0-98.1 in Males, 12.0-15.0 in Females)

## 2010-02-27 NOTE — Progress Notes (Signed)
Summary: tingling in hand and leg  Phone Note Call from Patient Call back at Home Phone [phone]   Caller: Patient Summary of Call: patient says she has beening having tingling in left hand and left leg feels sleep on and off wonders if this is a side effect of any of her bp meds because this didn't happen until she added the last medication Initial call taken by: Doristine Devoid,  September 25, 2007 8:34 AM  Follow-up for Phone Call        Patient noticed after she started Metoprolol & Felodipine that she started having tingling in her left forearm/hand & her left calf/foot.  It started just in the evenings but this week it has been constant.  BP is 137/82.  No other sxs. .............Marland KitchenShary Decamp  September 25, 2007 9:42 AM if HA, dizziness, double vision, slurred speach: go to the ER If no Sx like that: please do a head CT w/o  Dx paresthesias, HTN ER or call  if sx worsen or no better in few days Keep f/u as planned [facility] E. Paz MD  September 25, 2007 11:48 AM  discussed with patient .Marland KitchenMarland KitchenMarland KitchenShary Decamp  September 25, 2007 11:56 AM

## 2010-02-27 NOTE — Progress Notes (Signed)
Summary: question abot Metoprolol Rx  Phone Note From Pharmacy   Caller: ashley Summary of Call: Dr. Drue Novel    [facility]  [phone]--Ashley  [facility]rie Sheldon from [facility] said a rx was sent over today for Metoprolol and it was written---take 1 a day and the pt said she should be taken 4 a day. Initial call taken by: Vanessa [place],  September 17, 2007 11:34 AM  Follow-up for Phone Call        Spoke with Morrie Sheldon informed rx was changed to 2 pi two times a day verbal given for #120 2 refills .Kandice Hams  September 17, 2007 12:17 PM       Prescriptions: METOPROLOL TARTRATE 25 MG  TABS (METOPROLOL TARTRATE) 2  by mouth two times a day  #120 x 2   Entered by:   Kandice Hams   Authorized by:   Nolon Rod. Paz MD   Signed by:   Kandice Hams on 09/17/2007   Method used:   Telephoned to ...       [facility] [phone]*       [address]       [place], [place]  [address]       Ph: [phone]       Fax: [phone]   RxID:   [id number]

## 2010-02-27 NOTE — Progress Notes (Signed)
Summary: dry mouth  Phone Note Call from Patient Call back at Home Phone [phone]   Summary of Call: Patient calling:  - clonidine 0.2 patches is really keeping BP down  - pt is c/o of severe dry mouth  - keeping her awake at night  - pt had dry mouth when she was doing clonidine 0.1mg  patch (2 patches) but it is worse now  - pt has tried everything to help -- no relief Suggestions??  change med or will the dry mouth get better after she has been on it? ..Marland KitchenMarland KitchenShary Decamp  October 26, 2008 11:11 AM suck on a citric candy (or a   lime-lemon)  two times a day if no better or the s/e is unacceptable will hace to switch meds Jose E. Paz MD  October 26, 2008 3:47 PM  discussed with pt Shary Decamp  October 26, 2008 4:21 PM

## 2010-02-27 NOTE — Assessment & Plan Note (Signed)
Summary: COMPLETE PHYSICAL,WILL BE FASTING/RH....   Vital Signs:  Patient profile:   50 year old female Height:      61.5 inches Weight:      137.4 pounds Pulse rate:   60 / minute Pulse rhythm:   regular BP sitting:   110 / 76  (left arm) Cuff size:   regular  Vitals Entered By: Shary Decamp (May 25, 2008 10:07 AM) Comments cpx - fasting -- pt has gyn  -- when pt takes clonidine she gets really tired.  She only takes 1/2 tablet -- she can't take it in the morning when she goes to work because she is so tired Shary Decamp  May 25, 2008 10:09 AM    History of Present Illness: cpx --fasting -- pt has gyn  -- clonodine does get her BP down to 110/65 but pt feels it cause tiredness , she has not checked  her BP while tired (?overcontrolled)   Current Medications (verified): 1)  Maxalt 10 Mg Tabs (Rizatriptan Benzoate) .Marland Kitchen.. 1q 2 Hours As Needed Ha,no More Than 2 A Day 2)  Felodipine 10 Mg  Xr24h-Tab (Felodipine) .Marland Kitchen.. 1 By Mouth Once Daily 3)  Metoprolol Tartrate 25 Mg  Tabs (Metoprolol Tartrate) .... 2  By Mouth Two Times A Day 4)  Citalopram Hydrobromide 20 Mg  Tabs (Citalopram Hydrobromide) .Marland Kitchen.. 1 By Mouth Once Daily 5)  Clonidine Hcl 0.1 Mg Tabs (Clonidine Hcl) .Marland Kitchen.. 1 By Mouth Once Daily  Allergies (verified): 1)  ! Hydrocodone-Acetaminophen (Hydrocodone-Acetaminophen) 2)  ! Amoxicillin 3)  ! Hydrochlorothiazide 4)  ! Cozaar 5)  ! Ace Inhibitors  Past History:  Past Medical History:    Reviewed history from 10/12/2007 and no changes required:    G0    Hypertension    Anxiety    Migraine HA, worse during periods; on maxalt  Past Surgical History:    Appendectomy    R Oophorectomy     L thumb trigger finger release (12-09)  Social History:    Media planner    1 child adopted from [place]    tobacco--no    ETOH-- socially     exercise-- not routinely    diet-- trying to eat healthy    Occupation: Airline pilot, owns a small firm     Occupation:   employed  Review of Systems General:  Denies fever and weight loss. CV:  Denies chest pain or discomfort, palpitations, and swelling of feet. Resp:  Denies cough, shortness of breath, and wheezing.  Physical Exam  General:  alert, well-developed, and well-nourished.   Neck:  no masses, no thyromegaly, and normal carotid upstroke.   Lungs:  normal respiratory effort, no intercostal retractions, no accessory muscle use, and normal breath sounds.   Heart:  normal rate, regular rhythm, no murmur, and no gallop.   Abdomen:  soft, non-tender, no distention, and no masses.   Extremities:  no pretibial edema bilaterally  Psych:  Oriented X3, normally interactive, good eye contact, not anxious appearing, and not depressed appearing.     Impression & Recommendations:  Problem # 1:  HEALTH SCREENING (ICD-V70.0) female car  per gyn Td 2008 cont healthy lifestyle, exercise more  labs   Orders: Venipuncture (74259) TLB-BMP (Basic Metabolic Panel-BMET) (80048-METABOL) TLB-Lipid Panel (80061-LIPID) TLB-TSH (Thyroid Stimulating Hormone) (84443-TSH) TLB-Hepatic/Liver Function Pnl (80076-HEPATIC)  Problem # 2:  HYPERTENSION (ICD-401.9) see HPI, patient feels that clonodine is the meds that works the best for her; we suspect tiredness may be from overcontrolled  BP plan:  decrease B-blockers from 2 bid to 1bid keep checking BPs  at some point we may need to d/c b-blokers  Her updated medication list for this problem includes:    Felodipine 10 Mg Xr24h-tab (Felodipine) .Marland Kitchen... 1 by mouth once daily    Metoprolol Tartrate 25 Mg Tabs (Metoprolol tartrate) .Marland Kitchen... 1   by mouth two times a day    Clonidine Hcl 0.1 Mg Tabs (Clonidine hcl) .Marland Kitchen... 1 by mouth two times a day  Complete Medication List: 1)  Maxalt 10 Mg Tabs (Rizatriptan benzoate) .Marland Kitchen.. 1q 2 hours as needed ha,no more than 2 a day 2)  Felodipine 10 Mg Xr24h-tab (Felodipine) .Marland Kitchen.. 1 by mouth once daily 3)  Metoprolol Tartrate 25 Mg Tabs  (Metoprolol tartrate) .Marland Kitchen.. 1   by mouth two times a day 4)  Citalopram Hydrobromide 20 Mg Tabs (Citalopram hydrobromide) .Marland Kitchen.. 1 by mouth once daily 5)  Clonidine Hcl 0.1 Mg Tabs (Clonidine hcl) .Marland Kitchen.. 1 by mouth two times a day  Patient Instructions: 1)  call w/  your BP readings in 1 month 2)  Please schedule a follow-up appointment in 6 months .

## 2010-02-27 NOTE — Progress Notes (Signed)
Summary: change bp med  Phone Note Call from Patient Call back at [facility] Phone [phone]   Summary of Call: PATIENT LEFT MESSAGE ON VOICEMAIL: Patient would like to change cozaar to something cheaper ....Marland KitchenMarland KitchenShary [name]  February 23, 2008 8:29 AM    Follow-up for Phone Call        BP was not well control on ACEi and she also had somecough: patient needs to find if there is another ARB cover under her plan (diovan-benicar-etc), otherwise will have to do something else Let me know... Jose E. Paz MD  February 23, 2008 1:34 PM   left detailed message on machine for patient to check about insurance coverage & call me back...Marland KitchenMarland KitchenShary [name]  February 23, 2008 2:23 PM  patient states that the cozaar has also caused a cough...Marland KitchenMarland Kitchenpatient checked with her insurance & there is no generic ARB on her formulary.  what is the next step since she has developed a cough with ACEi & cozaar? Marland KitchenMarland KitchenMarland KitchenShary [name]  February 23, 2008 4:02 PM CHART REVIEWED , HCTZ caused palpitations, has limited options: d/c cozaar clonidine 0.1mg   two times a day , call 60,  Nurse visit 10 days for BP check Jose E. Paz MD  February 24, 2008 8:30 AM   Additional Follow-up for Phone Call Additional follow up Details #1::        left message on machine for patient to return call - need name of pharmacy ..........Marland KitchenShary [name]  February 24, 2008 9:04 AM  discussed with patient ..........Marland KitchenShary [name]  February 24, 2008 9:15 AM     New Allergies: ! HYDROCHLOROTHIAZIDE ! COZAAR ! ACE INHIBITORS New/Updated Medications: CLONIDINE HCL 0.1 MG TABS (CLONIDINE HCL) two times a day New Allergies: ! HYDROCHLOROTHIAZIDE ! COZAAR ! ACE INHIBITORS  Prescriptions: CLONIDINE HCL 0.1 MG TABS (CLONIDINE HCL) two times a day  #60 x 1   Entered by:   Amanda [name]   Authorized by:   Nolon Rod. Paz MD   Signed by:   Amanda [name] on 02/24/2008   Method used:   [facility]        [facility] [phone][facility] (retail)       [address]       [place], [place]  [address]       Ph: [phone]       Fax: [phone]   RxID:   [id number]

## 2010-02-27 NOTE — Assessment & Plan Note (Signed)
Summary: BACK ISSUE, NEEDS LABS FOR BP AND SUGAR///SPH   Vital Signs:  Patient Profile:   50 Years Old Female Height:     61.5 inches (156.21 cm) Weight:      139 pounds Temp:     98.2 degrees F oral Pulse rate:   80 / minute BP sitting:   132 / 88  (left arm) Cuff size:   regular  Vitals Entered By: Shonna Chock (July 01, 2007 1:50 PM)                 Chief Complaint:  1.) Back pain x 1 month 2.) Frequent urination and thirsty a lot, concerned about diabetes, and would like labs..  History of Present Illness: Pt reports approximately one month hx of left lower back pain. Seen at [facility] approximately 2 weeks ago and placed on prednisone. Pt also reports increased thirst prior to prednisone therapy and polyuria. Denies any dysuria or gross hematuria. Sister recently dx with DM2. Continued pain in lower back worse with lying down. Denies any radiation of pain. No recent fever.     Current Allergies (reviewed today): ! HYDROCODONE-ACETAMINOPHEN (HYDROCODONE-ACETAMINOPHEN)  Past Medical History:    Reviewed history from 07/04/2006 and no changes required:       G0       borderline htn  Past Surgical History:    Reviewed history from 07/04/2006 and no changes required:       Appendectomy       Oophorectomy   Family History:    Father: HTN deceased 39 yo from MI. father had first MI at age 33    Mother: HTN    Siblings: 2 sis HTN, 1 sis DM2    no colon or breast cancer       Risk Factors:  Tobacco use:  current    Year started:  quit 15-20 years ago Passive smoke exposure:  no Drug use:  no Alcohol use:  yes  Colonoscopy History:    Date of Last Colonoscopy:  01/28/2001  Mammogram History:    Date of Last Mammogram:  11/28/2005  PAP Smear History:    Date of Last PAP Smear:  06/29/2006   Review of Systems  General      Denies chills and fever.  GI      Denies abdominal pain, diarrhea, nausea, and vomiting.  GU      Complains of urinary  frequency.      Denies dysuria, hematuria, and urinary hesitancy.  MS      Complains of low back pain.  Neuro      Denies numbness and tingling.   Physical Exam  General:     Well-developed,well-nourished,in no acute distress; alert,appropriate and cooperative throughout examination Lungs:     Normal respiratory effort, chest expands symmetrically. Lungs are clear to auscultation, no crackles or wheezes. Heart:     Normal rate and regular rhythm. S1 and S2 normal without gallop, murmur, click, rub or other extra sounds. Abdomen:     Bowel sounds positive,abdomen soft and non-tender without masses, organomegaly or hernias noted. Msk:     Tenderness on palpation of left gluteal region. No obvious abnormalities, no decreased ROM.    Impression & Recommendations:  Problem # 1:  BACK PAIN, ACUTE (ICD-724.5) Assessment: New  Her updated medication list for this problem includes:    Flexeril 10 Mg Tabs (Cyclobenzaprine hcl) .Marland Kitchen... 1/2 to 1 tab by mouth q6hours as needed pain or spasm  No signs or  symptoms of infection. Tx with nsaids and narcotics as been ineffective. WIll try Flexiril to determine efficacy. Consider radiology studies if pain persists.  Problem # 2:  POLYURIA (EAV-409.81) Assessment: New strong family hx of DM2. Will check A1C.   Problem # 3:  HEALTH SCREENING (ICD-V70.0) Assessment: Comment Only Pt to return in am for FLP, CBC, BMET, A1c, TSH To schedule annual cpx with pcp  Complete Medication List: 1)  Maxalt Tabs (Rizatriptan benzoate tabs) .... P.r.n. headaches 2)  Nortriptyline Hcl 10 Mg Caps (Nortriptyline hcl) .Marland Kitchen.. 1 by mouth once daily 3)  Flexeril 10 Mg Tabs (Cyclobenzaprine hcl) .... 1/2 to 1 tab by mouth q6hours as needed pain or spasm   Patient Instructions: 1)  Return in the am for fasting blood work.  2)  FLP, TSH, CBC, BMET, A1C   ] Laboratory Results   Urine Tests   Date/Time Reported: July 01, 2007 2:28 PM   Routine Urinalysis    Color: yellow Appearance: Clear Glucose: negative   (Normal Range: Negative) Bilirubin: negative   (Normal Range: Negative) Ketone: negative   (Normal Range: Negative) Spec. Gravity: 1.010   (Normal Range: 1.003-1.035) Blood: negative   (Normal Range: Negative) pH: 6.0   (Normal Range: 5.0-8.0) Protein: negative   (Normal Range: Negative) Urobilinogen: 0.2   (Normal Range: 0-1) Nitrite: negative   (Normal Range: Negative) Leukocyte Esterace: negative   (Normal Range: Negative)    Comments: Chrae Malloy  July 01, 2007 2:28 PM

## 2010-02-27 NOTE — Assessment & Plan Note (Signed)
Summary: ro4--recheck on bp//ph   Vital Signs:  Patient Profile:   50 Years Old Female Height:     61.5 inches (156.21 cm) Weight:      140.8 pounds Pulse rate:   78 / minute Pulse rhythm:   regular BP sitting:   112 / 80  (left arm) Cuff size:   regular  Vitals Entered By: Shary Decamp (January 25, 2008 2:03 PM)                 PCP:  PAZ  Chief Complaint:  rov - bp.  History of Present Illness: BP f/u  ambulatory BPs 120s/80 rarely goes a little higher (150s)      Updated Prior Medication List: MAXALT 10 MG TABS (RIZATRIPTAN BENZOATE) 1q 2 hours as needed HA,no more than 2 a day CLINDAMYCIN HCL 150 MG  CAPS (CLINDAMYCIN HCL) for oral surgery. ALPRAZOLAM 0.25 MG  TABS (ALPRAZOLAM) 1 or 2 by mouth at bedtime as needed FELODIPINE 10 MG  XR24H-TAB (FELODIPINE) 1 by mouth once daily METOPROLOL TARTRATE 25 MG  TABS (METOPROLOL TARTRATE) 2  by mouth two times a day CITALOPRAM HYDROBROMIDE 20 MG  TABS (CITALOPRAM HYDROBROMIDE) 1 by mouth once daily LISINOPRIL 20 MG  TABS (LISINOPRIL) 1 by mouth once daily  Current Allergies (reviewed today): ! HYDROCODONE-ACETAMINOPHEN (HYDROCODONE-ACETAMINOPHEN) ! AMOXICILLIN  Past Medical History:    Reviewed history from 10/12/2007 and no changes required:       G0       Hypertension       Anxiety       Migraine HA, worse during periods; on maxalt  Past Surgical History:    Appendectomy    Oophorectomy    L thumb trigger finger release (12-09)     Review of Systems       good mec compliance   CV      Denies swelling of feet.  Resp      (+) cough x weeks, dry symptoms started after a sinus infection, had abx x 2 Rx by [facility] , and although all symptoms are gone, still has cough  Neuro      Denies headaches.   Physical Exam  General:     alert and well-developed.   Head:     no tender to palpation of maxilary and frontal sinus area   Ears:     R ear normal and L ear normal.   Mouth:     pharynx pink and  moist.   Lungs:     normal respiratory effort, no intercostal retractions, no accessory muscle use, and normal breath sounds.   Heart:     normal rate, regular rhythm, and no murmur.      Impression & Recommendations:  Problem # 1:  HYPERTENSION (ICD-401.9) BP well control but suspect cough is from ACEi, will switch to cozaar see instructions   Her updated medication list for this problem includes:    Felodipine 10 Mg Xr24h-tab (Felodipine) .Marland Kitchen... 1 by mouth once daily    Metoprolol Tartrate 25 Mg Tabs (Metoprolol tartrate) .Marland Kitchen... 2  by mouth two times a day    Cozaar 50 Mg Tabs (Losartan potassium) .Marland Kitchen... 1 by mouth once daily  BP today: 112/80 Prior BP: 150/100 (10/12/2007)  Labs Reviewed: Creat: 0.9 (10/12/2007) Chol: 206 (07/02/2007)   HDL: 56.6 (07/02/2007)   LDL: 121.7 (07/02/2007)   TG: 152 (07/02/2007)   Complete Medication List: 1)  Maxalt 10 Mg Tabs (Rizatriptan benzoate) .Marland Kitchen.. 1q 2 hours as needed  ha,no more than 2 a day 2)  Clindamycin Hcl 150 Mg Caps (Clindamycin hcl) .... For oral surgery. 3)  Alprazolam 0.25 Mg Tabs (Alprazolam) .Marland Kitchen.. 1 or 2 by mouth at bedtime as needed 4)  Felodipine 10 Mg Xr24h-tab (Felodipine) .Marland Kitchen.. 1 by mouth once daily 5)  Metoprolol Tartrate 25 Mg Tabs (Metoprolol tartrate) .... 2  by mouth two times a day 6)  Citalopram Hydrobromide 20 Mg Tabs (Citalopram hydrobromide) .Marland Kitchen.. 1 by mouth once daily 7)  Cozaar 50 Mg Tabs (Losartan potassium) .Marland Kitchen.. 1 by mouth once daily   Patient Instructions: 1)  Check your blood pressure 2 or 3 times a week. If it is more than 140/85 consistently,please let us know  2)  BMP in 4 weeks, Dx HTN 3)  Please schedule a PHYSICAL  in 4 months.   Prescriptions: COZAAR 50 MG TABS (LOSARTAN POTASSIUM) 1 by mouth once daily  #30 x 3   Entered and Authorized by:   Elita Quick E. Paz MD   Signed by:   Nolon Rod. Paz MD on 01/25/2008   Method used:   Print then Mail to Patient   RxID:   [id number]  ]

## 2010-02-27 NOTE — Assessment & Plan Note (Signed)
Summary: F/U BLOOD PRESSURE,NS FEE/RH.....   Vital Signs:  Patient profile:   50 year old female Height:      61.5 inches Weight:      144.2 pounds BMI:     26.90 Pulse rate:   60 / minute Pulse rhythm:   regular BP sitting:   120 / 78  (left arm) Cuff size:   regular  Vitals Entered By: Shary Decamp (October 06, 2008 10:21 AM) Comments    - pt will sometimes cut clonidine in 1/2 & take qid because of side effects (dry mouth, fatigue): pt does state that when she does not take clonidine her bp will "shoot up" Shary Decamp  October 06, 2008 10:23 AM    History of Present Illness: ROV HTN--pt  sometimes cut clonidine in 1/2 & take qid because the  side effects (dry mouth, fatigue) are  less noticeable  that way when she does not take clonidine her bp will "shoot up"  Current Medications (verified): 1)  Maxalt 10 Mg Tabs (Rizatriptan Benzoate) .Marland Kitchen.. 1q 2 Hours As Needed Ha,no More Than 2 A Day 2)  Felodipine 10 Mg  Xr24h-Tab (Felodipine) .Marland Kitchen.. 1 By Mouth Once Daily 3)  Metoprolol Tartrate 25 Mg  Tabs (Metoprolol Tartrate) .Marland Kitchen.. 1   By Mouth Two Times A Day 4)  Citalopram Hydrobromide 20 Mg  Tabs (Citalopram Hydrobromide) .Marland Kitchen.. 1 By Mouth Once Daily 5)  Clonidine Hcl 0.1 Mg Tabs (Clonidine Hcl) .Marland Kitchen.. 1 By Mouth Two Times A Day  Allergies (verified): 1)  ! Hydrocodone-Acetaminophen (Hydrocodone-Acetaminophen) 2)  ! Amoxicillin 3)  ! Hydrochlorothiazide 4)  ! Cozaar 5)  ! Ace Inhibitors  Past History:  Past Medical History: Reviewed history from 10/12/2007 and no changes required. G0 Hypertension Anxiety Migraine HA, worse during periods; on maxalt  Past Surgical History: Reviewed history from 05/25/2008 and no changes required. Appendectomy R Oophorectomy  L thumb trigger finger release (12-09)  Review of Systems CV:  Denies chest pain or discomfort, palpitations, and swelling of feet. Psych:  symptoms well controlled . Heme:  bruise easy (denies nose bleeds or  excessive gum bleed w/  flosing).  Physical Exam  General:  alert and well-developed.   Lungs:  normal respiratory effort, no intercostal retractions, no accessory muscle use, and normal breath sounds.   Heart:  normal rate, regular rhythm, no murmur, and no gallop.     Impression & Recommendations:  Problem # 1:  ANXIETY (ICD-300.00) well controlled  Her updated medication list for this problem includes:    Citalopram Hydrobromide 20 Mg Tabs (Citalopram hydrobromide) .Marland Kitchen... 1 by mouth once daily  Problem # 2:  HYPERTENSION (ICD-401.9) ambulatory BPs  ok s/e from by mouth clonidine persist, will try the patch, see instructions Her updated medication list for this problem includes:    Felodipine 10 Mg Xr24h-tab (Felodipine) .Marland Kitchen... 1 by mouth once daily    Metoprolol Tartrate 25 Mg Tabs (Metoprolol tartrate) .Marland Kitchen... 1   by mouth two times a day    Clonidine Hcl 0.1 Mg Tabs (Clonidine hcl) .Marland Kitchen... 1 by mouth two times a day    Clonidine Hcl 0.1 Mg/24hr Ptwk (Clonidine hcl) .Marland Kitchen... 1 patch a week  Problem # 3:  ?easy bruising patient to call if symptoms increase   Complete Medication List: 1)  Maxalt 10 Mg Tabs (Rizatriptan benzoate) .Marland Kitchen.. 1q 2 hours as needed ha,no more than 2 a day 2)  Felodipine 10 Mg Xr24h-tab (Felodipine) .Marland Kitchen.. 1 by mouth once daily 3)  Metoprolol Tartrate 25 Mg Tabs (Metoprolol tartrate) .Marland Kitchen.. 1   by mouth two times a day 4)  Citalopram Hydrobromide 20 Mg Tabs (Citalopram hydrobromide) .Marland Kitchen.. 1 by mouth once daily 5)  Clonidine Hcl 0.1 Mg Tabs (Clonidine hcl) .Marland Kitchen.. 1 by mouth two times a day 6)  Clonidine Hcl 0.1 Mg/24hr Ptwk (Clonidine hcl) .Marland Kitchen.. 1 patch a week  Patient Instructions: 1)  try the clonidine patch INSTEAD OF the pills 2)  if that works better for you, let us know  3)  if that does not work or is too expensive: continue  w/  clonodine by mouth 4)  Please schedule a follow-up appointment in 4 to  6 months .  Prescriptions: CLONIDINE HCL 0.1 MG/24HR PTWK  (CLONIDINE HCL) 1 patch a week  #4 x 6   Entered and Authorized by:   Elita Quick E. Paz MD   Signed by:   Nolon Rod. Paz MD on 10/06/2008   Method used:   Print then Give to Patient   RxID:   [id number]

## 2010-02-27 NOTE — Progress Notes (Signed)
Summary: PRIOR AUTHORIZATION APPROVED BENICAR//BC/BS  Phone Note Refill Request Message from:  Fax from Pharmacy on [address] fax [phone]  Refills Requested: Medication #1:  BENICAR 20 MG TABS one by mouth twice a day. prior authorization [id number]  Initial call taken by: Barb Merino,  January 19, 2009 9:35 AM  Follow-up for Phone Call        PRIOR AUTH IN PROCESS BC/BS for Benicar Follow-up by: Kandice Hams,  January 19, 2009 10:47 AM  Additional Follow-up for Phone Call Additional follow up Details #1::        Prior Auth approved 01/20/09  for Benicar for lifetime of policy per BC/BS.  [facility] [address] [facility] Additional Follow-up by: Kandice Hams,  January 24, 2009 10:12 AM

## 2010-02-27 NOTE — Medication Information (Signed)
Summary: Prior Authorization & Approval for Benicar/BCBSNC  Prior Authorization & Approval for Benicar/BCBSNC   Imported By: Lanelle Bal 01/30/2009 08:21:51  _____________________________________________________________________  External Attachment:    Type:   Image     Comment:   External Document

## 2010-02-27 NOTE — Letter (Signed)
Summary: Primary Care Consult Scheduled Letter  [facility] [place] [place]  [address] [place], [place] [address]   Phone: [phone]  Fax: [phone]      09/08/2007 MRN: [id number]  Amanda [name] [address] [place], [place]  [address]    Dear Ms. Bunn,      We have scheduled an appointment for you.  At the recommendation of Dr.Paz, we have scheduled you an appt at [facility]  on August 27th at 10:30.  Their address is_[address]. The office phone number is [phone]. If this appointment day and time is not convenient for you, please feel free to call the office of the doctor you are being referred to at the number listed above and reschedule the appointment.     It is important for you to keep your scheduled appointments. We are here to make sure you are given good patient care. If you have questions or you have made changes to your appointment, please notify us at  [phone], ask for Tiffany.    Thank you,  Patient Care Coordinator [place] [facility]

## 2010-02-27 NOTE — Assessment & Plan Note (Signed)
Summary: recheck new medication/nta   Vital Signs:  Patient profile:   50 year old female Height:      61.5 inches (156.21 cm) Weight:      143.38 pounds (65.17 kg) Pulse rate:   60 / minute BP sitting:   110 / 70  Vitals Entered By: Kandice Hams (January 13, 2009 10:49 AM) CC: WANTS TO DISCUSS BP MEDS c/o bp increases  at night  160/90   History of Present Illness: here for BP magament chart reviewed   --Clonidine caused  side effects,dry mouth, weight gain, the patch leaves red spots.  -- Lisinopril :  dry cough --apparently she used Cozaar before, and caused cough       Allergies: 1)  ! Hydrocodone-Acetaminophen (Hydrocodone-Acetaminophen) 2)  ! Amoxicillin 3)  ! Hydrochlorothiazide 4)  ! Cozaar 5)  ! Ace Inhibitors  Past History:  Past Medical History: Reviewed history from 10/12/2007 and no changes required. G0 Hypertension Anxiety Migraine HA, worse during periods; on maxalt  Past Surgical History: Reviewed history from 05/25/2008 and no changes required. Appendectomy R Oophorectomy  L thumb trigger finger release (12-09)  Social History: Reviewed history from 05/25/2008 and no changes required. Domestic Partner 1 child adopted from [place] tobacco--no ETOH-- socially  exercise-- not routinely diet-- trying to eat healthy Occupation: Airline pilot, owns a small firm   Review of Systems       now on micardis 40 q am, denies cough, mild dryness in the mouth ambulatory BPs 140/80 in the morning, 160/90 at night. Since she started micardis, she has noted  looser stools, she has one to two bowel movements daily  denies nausea, vomiting, blood in the stools.  Physical Exam  General:  alert and well-developed.   Lungs:  normal respiratory effort, no intercostal retractions, no accessory muscle use, and normal breath sounds.   Heart:  normal rate, regular rhythm, no murmur, and no gallop.   Abdomen:  soft, non-tender, no distention, and no masses.      Impression & Recommendations:  Problem # 1:  HYPERTENSION (ICD-401.9)  see HPI and review of systems apparently, micardis causing diarrhea ( 3% ofthe patient may develop that) switch to Benicar The following medications were removed from the medication list:    Micardis 40 Mg Tabs (Telmisartan) .Marland Kitchen... 1/2 tablet daily, increase to 1 tablet if bp >120/80 Her updated medication list for this problem includes:    Felodipine 10 Mg Xr24h-tab (Felodipine) .Marland Kitchen... 1 by mouth once daily    Metoprolol Tartrate 25 Mg Tabs (Metoprolol tartrate) .Marland Kitchen... 1   by mouth two times a day    Benicar 20 Mg Tabs (Olmesartan medoxomil) ..... One by mouth twice a day  Complete Medication List: 1)  Maxalt 10 Mg Tabs (Rizatriptan benzoate) .Marland Kitchen.. 1q 2 hours as needed ha,no more than 2 a day 2)  Felodipine 10 Mg Xr24h-tab (Felodipine) .Marland Kitchen.. 1 by mouth once daily 3)  Metoprolol Tartrate 25 Mg Tabs (Metoprolol tartrate) .Marland Kitchen.. 1   by mouth two times a day 4)  Citalopram Hydrobromide 20 Mg Tabs (Citalopram hydrobromide) .Marland Kitchen.. 1 by mouth once daily 5)  Benicar 20 Mg Tabs (Olmesartan medoxomil) .... One by mouth twice a day  Patient Instructions: 1)  change to Benicar 20 mg. 2)  One every morning  the first week. 3)  then  take one tablet twice a day 4)  nurse visit in 6 weeks for a BP check and BMP, DX  hypertension  5)  Please schedule a follow-up  appointment in 4 months .  Prescriptions: BENICAR 20 MG TABS (OLMESARTAN MEDOXOMIL) one by mouth twice a day  #60 x 3   Entered and Authorized by:   Elita Quick E. Paz MD   Signed by:   Nolon Rod. Paz MD on 01/13/2009   Method used:   [facility]ronically to        [facility] [phone][facility] (retail)       [address]       [place], [place]  [address]       Ph: [phone]       Fax: [phone]   RxID:   [id number]

## 2010-02-27 NOTE — Letter (Signed)
Summary: Primary Care Appointment Letter  [place] [facility] [place]  [address] [place], [place] [address]   Phone: [phone]  Fax: [phone]    07/24/2007 MRN: [id number]  [facility] [address] [place], [place]  [address]  Dear Ms. Escalona,   Your Primary Care Physician  has indicated that:    _______it is time to schedule an appointment.    _______you missed your appointment on______ and need to call and          reschedule.    _XXX______you need to have lab work done.    _______you need to schedule an appointment discuss lab or test results.    _______you need to call to reschedule your appointment that is                       scheduled on _________.     Please call our office as soon as possible. Our phone number is [phone]________. Please press option 1. Our office is open 8a-12noon and 1p-5p, Monday through Friday.     Thank you,    [place] [facility] Scheduler

## 2010-06-15 NOTE — Op Note (Signed)
[facility]  Patient:    Amanda [name], Amanda [name] Visit Number: [id number] MRN: [id number]          Service Type: DSU Location: [facility] Attending Physician:  Jenean Lindau Dictated by:   Laqueta Linden, M.D. Proc. Date: 12/30/00 Admit Date:  12/30/2000                             Operative Report  PREOPERATIVE DIAGNOSIS:       Breakthrough bleeding, dysmenorrhea, endometrial polyp.  POSTOPERATIVE DIAGNOSIS:      Breakthrough bleeding, dysmenorrhea, endometrial polyp.  OPERATION:                    Hysteroscopy resection with curettage.  SURGEON:                      Laqueta Linden, M.D.  ASSISTANT:  ANESTHESIA:                   General LMA.  ESTIMATED BLOOD LOSS:         Approximately 50 cc.  SORBITOL:                     Net intake approximately 100 cc.  COMPLICATIONS:                None.  INDICATIONS:                  Amanda [name] is a 50 year old nulligravid female who has had worsening dysmenorrhea and breakthrough bleeding on longterm oral contraceptives.  She underwent a pelvic ultrasound with sonohysterogram which revealed several small intramural and serosal fibroids. Sonohysterogram revealed a 7 mm fundal endometrial polyp.  It was felt that the polyp was at least contributory to the patients cramping and abnormal bleeding, although, might not be the sole cause.  The patient was not ready for definitive surgical management and elected to proceed to an attempt at hysteroscopic resection to evaluate her symptom response.  She is therefore to undergo this outpatient procedure.  She has seen the informed consent films and counseled as to the risks, benefits, alternatives, complications, in particular, the possible incomplete or temporary or no relief of her symptoms as a result and agrees to proceed.  Full consent was given.  DESCRIPTION OF PROCEDURE:     The patient was taken to the operating room and after proper identification and  consents were ascertained, she was placed on the operating table in the supine position.  After general LMA anesthesia was established, she was placed in Kelso stirrups, and perineum and vagina were prepped and draped in the routine sterile fashion.  A transurethral Foley was placed which was removed at the conclusion of the procedure.  Bimanual examination confirmed a narrow introitus.  The uterus was anterior, mobile, and slightly irregular.  A Peterson speculum was placed in the vagina.  The cervix was grasped with a single tooth tenaculum.  The internal os was gently dilated to a #33 Pratt dilator.  The resectoscope with video and continuous Sorbitol infusion was then inserted under direct vision.  The endocervical canal was free of lesions.  The fundus was filled with a polyp as was noted on sonohysterogram.  Both tubal ostia were visualized.  There were no other focal lesions noted, although, the entire endometrial cavity was felt to be quite shaggy and thickened which was felt to be  more so than would be expected with longterm oral contraceptive use.  The resectoscope was placed on routine settings.  The polyp was then resected in a routine fashion.  Polyp pieces were submitted to pathology.  Sharp curettage was then performed with a moderate amount of additinal tissue obtained.  The scope was then reinserted and after clearing of a small amount of blood, there was no active bleeding noted and no focal lesions identified.  Several photographs were taken for confirmation of findings.  All instruments were removed.  The tenaculum site was hemostatic.  There was no active bleeding from the cervix.  Net Sorbitol intake was approximately 100 cc.  Estimated blood loss was 50 cc. Complications were none.  The patient was stable on transfer to the recovery room.  She will be observed and discharged per anesthesia protocol.  She received Toradol 30 mg IV prior to transfer to the PACU.  She  is to take Advil, Aleve, as needed for additional cramping and to continue on her birth control pills.  She will follow up in the office in four to six weeks time or sooner for excessive pain, fever, bleeding, or other concerns. Dictated by:   Laqueta Linden, M.D. Attending Physician:  Jenean Lindau DD:  12/30/00 TD:  12/30/00 Job: [id number]

## 2010-06-19 ENCOUNTER — Other Ambulatory Visit: Payer: Self-pay | Admitting: Family Medicine

## 2010-06-19 ENCOUNTER — Ambulatory Visit
Admission: RE | Admit: 2010-06-19 | Discharge: 2010-06-19 | Disposition: A | Discharge status: Home / Self Care | Payer: BC Managed Care – PPO | Source: Ambulatory Visit | Attending: Family Medicine | Admitting: Family Medicine

## 2010-06-19 DIAGNOSIS — R0600 Dyspnea, unspecified: Secondary | ICD-10-CM

## 2010-06-19 MED ORDER — IOHEXOL 300 MG/ML  SOLN: 125.0000 mL | Freq: Once | INTRAMUSCULAR | Status: AC | PRN

## 2010-10-26 LAB — POCT I-STAT, CHEM 8
BUN: 16
Calcium, Ion: 1.32
Chloride: 106
Creatinine, Ser: 1
Glucose, Bld: 137 — ABNORMAL HIGH
HCT: 38
Hemoglobin: 12.9
Potassium: 4.6
Sodium: 137
TCO2: 23

## 2011-03-19 ENCOUNTER — Telehealth: Payer: Self-pay | Admitting: Internal Medicine

## 2011-03-19 NOTE — Telephone Encounter (Signed)
Spoke with pt and advised that records have not been received.  She is going to go by [place] office to pick up records and bring to ov.

## 2011-03-20 ENCOUNTER — Encounter: Payer: Self-pay | Admitting: Internal Medicine

## 2011-03-20 ENCOUNTER — Ambulatory Visit (INDEPENDENT_AMBULATORY_CARE_PROVIDER_SITE_OTHER): Payer: BC Managed Care – PPO | Admitting: Internal Medicine

## 2011-03-20 DIAGNOSIS — J45909 Unspecified asthma, uncomplicated: Secondary | ICD-10-CM | POA: Insufficient documentation

## 2011-03-20 DIAGNOSIS — R05 Cough: Secondary | ICD-10-CM

## 2011-03-20 DIAGNOSIS — R059 Cough, unspecified: Secondary | ICD-10-CM

## 2011-03-20 MED ORDER — CEFDINIR 300 MG PO CAPS: 300.0000 mg | ORAL_CAPSULE | Freq: Two times a day (BID) | ORAL | Status: AC

## 2011-03-20 MED ORDER — DM-GUAIFENESIN ER 30-600 MG PO TB12: ORAL_TABLET | ORAL | Status: DC

## 2011-03-20 MED ORDER — BUDESONIDE-FORMOTEROL FUMARATE 80-4.5 MCG/ACT IN AERO: INHALATION_SPRAY | RESPIRATORY_TRACT | Status: DC

## 2011-03-20 MED ORDER — TRAMADOL HCL 50 MG PO TABS: ORAL_TABLET | ORAL | Status: AC

## 2011-03-20 MED ORDER — FAMOTIDINE 20 MG PO TABS: ORAL_TABLET | ORAL | Status: DC

## 2011-03-20 NOTE — Patient Instructions (Signed)
Work on inhaler technique:  relax and gently blow all the way out then take a nice smooth deep breath back in, triggering the inhaler at same time you start breathing in.  Hold for up to 5 seconds if you can.  Rinse and gargle with water when done   If your mouth or throat starts to bother you,   I suggest you time the inhaler to your dental care and after using the inhaler(s) brush teeth and tongue with a baking soda containing toothpaste and when you rinse this out, gargle with it first to see if this helps your mouth and throat.     Symbicort 80 Take 2 puffs first thing in am and then another 2 puffs about 12 hours later.    Only use your albuterol as a rescue medication to be used if you can't catch your breath (wait 15 min after symbicort) by resting or doing a relaxed purse lip breathing pattern. The less you use it, the better it will work when you need it.   The key to effective treatment for your cough is eliminating the non-stop cycle of cough you're stuck in long enough to let your airway heal completely and then see if there is anything still making you cough once you stop the cough suppression, but this should take no more than 5 days to figuure out  First take delsym two tsp every 12 hours and supplement if needed with  tramadol 50 mg up to 2 every 4 hours to suppress the urge to cough. Swallowing water or using ice chips/non mint and menthol containing candies (such as lifesavers or sugarless jolly ranchers) are also effective.  You should rest your voice and avoid activities that you know make you cough.  Once you have eliminated the cough for 3 straight days try reducing the tramadol first,  then the mucinex dm as tolerated.    Try prilosec 20mg (nexium40)   Take 30-60 min before first meal of the day and Pepcid 20 mg one bedtime until cough is completely gone for at least a week without the need for cough suppression  I think of reflux for chronic cough like I do oxygen for fire  (doesn't cause the fire but once you get the oxygen suppressed it usually goes away regardless of the exact cause).  GERD (REFLUX)  is an extremely common cause of respiratory symptoms, many times with no significant heartburn at all.    It can be treated with medication, but also with lifestyle changes including avoidance of late meals, excessive alcohol, smoking cessation, and avoid fatty foods, chocolate, peppermint, colas, red wine, and acidic juices such as orange juice.  NO MINT OR MENTHOL PRODUCTS SO NO COUGH DROPS  USE SUGARLESS CANDY INSTEAD (jolley ranchers or Stover's)  NO OIL BASED VITAMINS - use powdered substitutes.   Omincef 300 mg twice daily x 10 days   Prednisone 10 mg take  4 each am x 2 days,   2 each am x 2 days,  1 each am x2days and stop    Please schedule a follow up office visit in 2  weeks, sooner if needed  If not better call [phone] ask for Almyra Free to do CT Sinus CT before the visit

## 2011-03-20 NOTE — Progress Notes (Signed)
  Subjective:    Patient ID: Amanda [name], female    DOB: 1960-12-30, 52 y.o.   MRN: [id number]  HPI  Brief patient profile:  50 yowf outgrew asthma age around and smoked in 20's tendency to bronchitis starting in her 30s maybe once a year and fine between flares and that pattern lasted until 2009-2010 more freq severe and lasts longer and persistent since 10/2010 and therefore referred 03/20/2011 by Dr Azucena Cecil.  03/20/2011 1st pulmonary ov cc sob/wheeze/am coughing abupt onset 2 days p [facility]  Started nexium 2/7 once daily before bfast.  Cough can be quite violent/ grinding in quality, but minimal mucus produced and worse day than night. Not much better with prednisone.   Sleeping ok without nocturnal  or early am exacerbation  of respiratory  c/o's or need for noct saba. Also denies any obvious fluctuation of symptoms with weather or environmental changes or other aggravating or alleviating factors except as outlined above   Review of Systems  Constitutional: Negative for fever, chills and unexpected weight change.  HENT: Positive for congestion. Negative for ear pain, nosebleeds, sore throat, rhinorrhea, sneezing, trouble swallowing, dental problem, voice change, postnasal drip and sinus pressure.   Eyes: Negative for visual disturbance.  Respiratory: Positive for cough and shortness of breath. Negative for choking.   Cardiovascular: Negative for chest pain and leg swelling.  Gastrointestinal: Negative for vomiting, abdominal pain and diarrhea.  Genitourinary: Negative for difficulty urinating.  Musculoskeletal: Negative for arthralgias.  Skin: Negative for rash.  Neurological: Negative for tremors, syncope and headaches.  Hematological: Does not bruise/bleed easily.       Objective:   Physical Exam Somewhat somber amb wf nad Wt 146 03/20/11 HEENT: nl dentition, turbinates, and orophanx. Nl external ear canals without cough reflex   NECK :   without JVD/Nodes/TM/ nl carotid upstrokes bilaterally   LUNGS: no acc muscle use, clear to A and P bilaterally without cough on insp or exp maneuvers   CV:  RRR  no s3 or murmur or increase in P2, no edema   ABD:  soft and nontender with nl excursion in the supine position. No bruits or organomegaly, bowel sounds nl  MS:  warm without deformities, calf tenderness, cyanosis or clubbing  SKIN: warm and dry without lesions    NEURO:  alert, approp, no deficits         Assessment & Plan:

## 2011-03-21 ENCOUNTER — Encounter: Payer: Self-pay | Admitting: Internal Medicine

## 2011-03-21 ENCOUNTER — Telehealth: Payer: Self-pay | Admitting: Internal Medicine

## 2011-03-21 DIAGNOSIS — R059 Cough, unspecified: Secondary | ICD-10-CM | POA: Insufficient documentation

## 2011-03-21 DIAGNOSIS — R05 Cough: Secondary | ICD-10-CM | POA: Insufficient documentation

## 2011-03-21 MED ORDER — PREDNISONE 10 MG PO TABS: ORAL_TABLET | ORAL | Status: DC

## 2011-03-21 NOTE — Telephone Encounter (Signed)
Per 2.20.13 patient instructions:  Omincef 300 mg twice daily x 10 days  Prednisone 10 mg take 4 each am x 2 days, 2 each am x 2 days, 1 each am x2days and stop   rx sent to pharmacy.  Called spoke with patient, apologized for rx not being sent and informed pt will send now.  Pt verbalized her understanding.

## 2011-03-21 NOTE — Assessment & Plan Note (Signed)
Not really clear how much of this is really asthma.  The proper method of use, as well as anticipated side effects, of this metered-dose inhaler are discussed and demonstrated to the patient. Improved to 75% with coaching so ok to continue symbicort 80 2bid with option to change to qvar if cough remains the primary issue

## 2011-03-21 NOTE — Assessment & Plan Note (Signed)
The most common causes of chronic cough in immunocompetent adults include the following: upper airway cough syndrome (UACS), previously referred to as postnasal drip syndrome (PNDS), which is caused by variety of rhinosinus conditions; (2) asthma; (3) GERD; (4) chronic bronchitis from cigarette smoking or other inhaled environmental irritants; (5) nonasthmatic eosinophilic bronchitis; and (6) bronchiectasis.   These conditions, singly or in combination, have accounted for up to 94% of the causes of chronic cough in prospective studies.   Other conditions have constituted no >6% of the causes in prospective studies These have included bronchogenic carcinoma, chronic interstitial pneumonia, sarcoidosis, left ventricular failure, ACEI-induced cough, and aspiration from a condition associated with pharyngeal dysfunction.   Of the three most common causes of chronic cough, only one (GERD)  can actually cause the other two (asthma and post nasal drip syndrome)  and perpetuate the cylce of cough inducing airway trauma, inflammation, heightened sensitivity to reflux which is prompted by the cough itself via a cyclical mechanism.    This may partially respond to steroids and look like asthma and post nasal drainage but never erradicated completely unless the cough and the secondary reflux are eliminated, preferably both at the same time.  While not intuitively obvious, many patients with chronic low grade reflux do not cough until there is a secondary insult that disturbs the protective epithelial barrier and exposes sensitive nerve endings.  This can be viral or direct physical injury such as with an endotracheal tube.   The point is that once this occurs, it is difficult to eliminate using anything but a maximally effective acid suppression regimen at least in the short run, accompanied by an appropriate diet to address non acid GERD.   Will try to eliminate the present cycle of cough and if not improving next  step is sinus ct  The standardized cough guidelines recently published in Chest by [place] in 2006  are a multiple step process (up to 12!) , not a single office visit,  and are intended  to address this problem logically,  with an alogrithm dependent on response to empiric treatment at  each progressive step  to determine a specific diagnosis with  minimal addtional testing needed. Therefore if compliance is an issue or can't be accurately verified then it's very unlikely the standard evaluation and treatment will be successful here.    Furthermore, response to therapy (other than acute cough suppression, which should only be used short term with avoidance of narcotic containing cough syrups if possible), can be a gradual process for which the patient may not receive immediate benefit.  Unlike going to an eye doctor where the right rx is almost always the first one and is immediately effective, this is almost never the case in the management of chronic cough syndromes and the patient needs to commit up front to compliance with recommendations and have the patience to wait out a response for up to 6 weeks of therapy directed at the likely underlying problem(s).

## 2011-03-27 ENCOUNTER — Telehealth: Payer: Self-pay | Admitting: Internal Medicine

## 2011-03-27 DIAGNOSIS — R059 Cough, unspecified: Secondary | ICD-10-CM

## 2011-03-27 DIAGNOSIS — R05 Cough: Secondary | ICD-10-CM

## 2011-03-27 NOTE — Telephone Encounter (Signed)
Pt states she is no better. Per MW 03/20/11 OV note pt to have CT sinus on return.  Dr. Sherene Sires please advise if you want with or w/o contrast, or limited. Thanks.

## 2011-03-27 NOTE — Telephone Encounter (Signed)
Limited sinus ct does not need contrast

## 2011-03-27 NOTE — Telephone Encounter (Signed)
Order placed to [facility]. 

## 2011-03-27 NOTE — Telephone Encounter (Signed)
Pt adds: this needs to be a sinus CT "before" her f/u with MW on 04/04/11.

## 2011-03-29 ENCOUNTER — Ambulatory Visit (INDEPENDENT_AMBULATORY_CARE_PROVIDER_SITE_OTHER)
Admission: RE | Admit: 2011-03-29 | Discharge: 2011-03-29 | Disposition: A | Discharge status: Home / Self Care | Payer: BC Managed Care – PPO | Source: Ambulatory Visit | Attending: Internal Medicine | Admitting: Internal Medicine

## 2011-03-29 ENCOUNTER — Encounter: Payer: Self-pay | Admitting: Internal Medicine

## 2011-03-29 DIAGNOSIS — R05 Cough: Secondary | ICD-10-CM

## 2011-03-29 DIAGNOSIS — R059 Cough, unspecified: Secondary | ICD-10-CM

## 2011-04-01 ENCOUNTER — Telehealth: Payer: Self-pay | Admitting: Internal Medicine

## 2011-04-01 NOTE — Telephone Encounter (Signed)
Called and spoke with pt.  Pt aware of CT sinus results and MW's response/recs.  Nothing further needed.

## 2011-04-04 ENCOUNTER — Ambulatory Visit (INDEPENDENT_AMBULATORY_CARE_PROVIDER_SITE_OTHER): Payer: BC Managed Care – PPO | Admitting: Internal Medicine

## 2011-04-04 ENCOUNTER — Encounter: Payer: Self-pay | Admitting: Internal Medicine

## 2011-04-04 ENCOUNTER — Other Ambulatory Visit (INDEPENDENT_AMBULATORY_CARE_PROVIDER_SITE_OTHER): Payer: BC Managed Care – PPO

## 2011-04-04 VITALS — BP 130/98 | HR 74 | Temp 98.3°F | Ht 62.0 in | Wt 146.2 lb

## 2011-04-04 DIAGNOSIS — R05 Cough: Secondary | ICD-10-CM

## 2011-04-04 DIAGNOSIS — J45909 Unspecified asthma, uncomplicated: Secondary | ICD-10-CM

## 2011-04-04 DIAGNOSIS — R059 Cough, unspecified: Secondary | ICD-10-CM

## 2011-04-04 LAB — CBC WITH DIFFERENTIAL/PLATELET
Basophils Absolute: 0.1 10*3/uL (ref 0.0–0.1)
Basophils Relative: 0.9 % (ref 0.0–3.0)
Eosinophils Absolute: 0.1 10*3/uL (ref 0.0–0.7)
Eosinophils Relative: 1.4 % (ref 0.0–5.0)
HCT: 40.3 % (ref 36.0–46.0)
Hemoglobin: 13.6 g/dL (ref 12.0–15.0)
Lymphocytes Relative: 26.2 % (ref 12.0–46.0)
Lymphs Abs: 2.4 10*3/uL (ref 0.7–4.0)
MCHC: 33.8 g/dL (ref 30.0–36.0)
MCV: 92.6 fl (ref 78.0–100.0)
Monocytes Absolute: 0.7 10*3/uL (ref 0.1–1.0)
Monocytes Relative: 7 % (ref 3.0–12.0)
Neutro Abs: 6 10*3/uL (ref 1.4–7.7)
Neutrophils Relative %: 64.5 % (ref 43.0–77.0)
Platelets: 343 10*3/uL (ref 150.0–400.0)
RBC: 4.36 Mil/uL (ref 3.87–5.11)
RDW: 12.5 % (ref 11.5–14.6)
WBC: 9.4 10*3/uL (ref 4.5–10.5)

## 2011-04-04 MED ORDER — BUDESONIDE-FORMOTEROL FUMARATE 160-4.5 MCG/ACT IN AERO: INHALATION_SPRAY | RESPIRATORY_TRACT | Status: DC

## 2011-04-04 NOTE — Patient Instructions (Signed)
Increase the symbicort to 160 Take 2 puffs first thing in am and then another 2 puffs about 12 hours later.   Please remember to go to the lab  department downstairs for your tests - we will call you with the results when they are available.    Use chlortrimeton 4 mg up every 6 hours to suppress excess nasal  drainage  Please see patient coordinator before you leave today  to schedule methacholine challenge test (do not use the inhaler that morning)

## 2011-04-04 NOTE — Progress Notes (Signed)
Subjective:    Patient ID: Amanda [name], female    DOB: [birth year], 51 y.o.   MRN: [id number]    Brief patient profile:  50 yowf outgrew asthma age around 68-9 with a lot of of noct wheeze and smoked only  in 20's tendency to bronchitis starting in her 30s maybe once a year and fine between flares and that pattern lasted until 2009-2010 more freq severe and lasts longer and persistent since 10/2010 and therefore referred 03/20/2011 by Dr Azucena Cecil.  03/20/2011 1st pulmonary ov cc sob/wheeze/am coughing abupt onset 2 days p [facility].  Started nexium 2/7 once daily before bfast.  Cough can be quite violent/ grinding in quality, but minimal mucus produced and worse day than night. Not much better with prednisone.  rec  Work on inhaler technique:  relax and gently blow all the way out then take a nice smooth deep breath back in, triggering the inhaler at same time you start breathing in.  Hold for up to 5 seconds if you can.  Rinse and gargle with water when done   If your mouth or throat starts to bother you,   I suggest you time the inhaler to your dental care and after using the inhaler(s) brush teeth and tongue with a baking soda containing toothpaste and when you rinse this out, gargle with it first to see if this helps your mouth and throat.     Symbicort 80 Take 2 puffs first thing in am and then another 2 puffs about 12 hours later.    Only use your albuterol as a rescue medication to be used if you can't catch your breath (wait 15 min after symbicort) by resting or doing a relaxed purse lip breathing pattern. The less you use it, the better it will work when you need it.   The key to effective treatment for your cough is eliminating the non-stop cycle of cough you're stuck in long enough to let your airway heal completely and then see if there is anything still making you cough once you stop the cough suppression, but this should take no more than 5 days to  figuure out  First take delsym two tsp every 12 hours and supplement if needed with  tramadol 50 mg up to 2 every 4 hours to suppress the urge to cough. Swallowing water or using ice chips/non mint and menthol containing candies (such as lifesavers or sugarless jolly ranchers) are also effective.  You should rest your voice and avoid activities that you know make you cough.  Once you have eliminated the cough for 3 straight days try reducing the tramadol first,  then the mucinex dm as tolerated.    Try prilosec 20mg (nexium40)   Take 30-60 min before first meal of the day and Pepcid 20 mg one bedtime GERD diet. Omincef 300 mg twice daily x 10 days  Prednisone 10 mg take  4 each am x 2 days,   2 each am x 2 days,  1 each am x2days and stop     CT Sinus CT before the visit> minimally thickened> chlortrimeton 4 mg   04/04/2011 f/u ov/Toniette Devera cc better but still coughing 30 m after shower, not at all while sleeping, mild doe like on one flight of steps and no excess mucus production, some better with symbicort.  Sleeping ok without nocturnal  or early am exacerbation  of respiratory  c/o's or need for noct saba. Also denies any  obvious fluctuation of symptoms with weather or environmental changes or other aggravating or alleviating factors except as outlined above        Objective:   Physical Exam  Somewhat somber amb wf nad  Wt 146 03/20/11 > 04/04/2011 146  HEENT: nl dentition, turbinates, and orophanx. Nl external ear canals without cough reflex   NECK :  without JVD/Nodes/TM/ nl carotid upstrokes bilaterally   LUNGS: no acc muscle use, clear to A and P bilaterally without cough on insp or exp maneuvers   CV:  RRR  no s3 or murmur or increase in P2, no edema   ABD:  soft and nontender with nl excursion in the supine position. No bruits or organomegaly, bowel sounds nl  MS:  warm without deformities, calf tenderness, cyanosis or clubbing           Assessment & Plan:

## 2011-04-05 ENCOUNTER — Encounter: Payer: Self-pay | Admitting: Internal Medicine

## 2011-04-05 LAB — ALLERGY PROFILE REGION II-DC, DE, MD, ~~LOC~~, VA
Allergen, D pternoyssinus,d7: 0.53 kU/L — ABNORMAL HIGH (ref <0.35)
Alternaria Alternata: <0.1 kU/L (ref <0.35)
Aspergillus fumigatus, IgG: <0.1 kU/L (ref <0.35)
Bermuda Grass: 0.57 kU/L — ABNORMAL HIGH (ref <0.35)
Box Elder IgE: <0.1 kU/L (ref <0.35)
Cat Dander: <0.1 kU/L (ref <0.35)
Cladosporium Herbarum: <0.1 kU/L (ref <0.35)
Cockroach: <0.1 kU/L (ref <0.35)
Common Ragweed: 0.45 kU/L — ABNORMAL HIGH (ref <0.35)
D. farinae: 0.65 kU/L — ABNORMAL HIGH (ref <0.35)
Dog Dander: <0.1 kU/L (ref <0.35)
Elm IgE: <0.1 kU/L (ref <0.35)
IgE (Immunoglobulin E), Serum: 18.7 IU/mL (ref 0.0–180.0)
Johnson Grass: 0.64 kU/L — ABNORMAL HIGH (ref <0.35)
Lamb's Quarters: <0.1 kU/L (ref <0.35)
Meadow Grass: 0.9 kU/L — ABNORMAL HIGH (ref <0.35)
Oak: <0.1 kU/L (ref <0.35)
Pecan/Hickory Tree IgE: <0.1 kU/L (ref <0.35)

## 2011-04-05 NOTE — Assessment & Plan Note (Signed)
-   CT chest 06/19/2010 neg for bronchiectasis   - Sinus CT 03/29/2011 Essentially negative paranasal sinuses; minimal left maxillary mucosal thickening without evidence of acute sinusitis > trial of 1st gen H1 rec  - Allergy profile 04/04/11 > no eos, IgE 18.7 >>>  Continue to feel this is most likely  Classic Upper airway cough syndrome, so named because it's frequently impossible to sort out how much is  CR/sinusitis with freq throat clearing (which can be related to primary GERD)   vs  causing  secondary (" extra esophageal")  GERD from wide swings in gastric pressure that occur with throat clearing, often  promoting self use of mint and menthol lozenges that reduce the lower esophageal sphincter tone and exacerbate the problem further in a cyclical fashion.   These are the same pts (now being labeled as having "irritable larynx syndrome" by some cough centers) who not infrequently have a history of having failed to tolerate ace inhibitors,  dry powder inhalers or biphosphonates or report having atypical reflux symptoms that don't respond to standard doses of PPI , and are easily confused as having aecopd or asthma flares by even experienced allergists/ pulmonologists.   Having said that, .Chronic cough is often simultaneously caused by more than one condition. A single cause has been found from 38 to 82% of the time, multiple causes from 18 to 62%. Multiply caused cough has been the result of three diseases up to 42% of the time.    She could also have cough variant asthma so The proper method of use, as well as anticipated side effects, of this metered-dose inhaler are discussed and demonstrated to the patient.  IMproved to 75% so try max doses symbicort then methacholine challenge if not satisfied with symptom control  See instructions for specific recommendations which were reviewed directly with the patient who was given a copy with highlighter outlining the key components.

## 2011-04-05 NOTE — Assessment & Plan Note (Signed)
Increase symbicort to 160 Take 2 puffs first thing in am and then another 2 puffs about 12 hours later.    Reinforced optimal hfa technique so as not to provoke worsening of the upper airway component of her cough

## 2011-04-08 NOTE — Progress Notes (Signed)
Quick Note:  Spoke with pt and notified of results per Dr. Wert. Pt verbalized understanding and denied any questions.  ______ 

## 2011-04-18 ENCOUNTER — Ambulatory Visit (HOSPITAL_COMMUNITY)
Admission: RE | Admit: 2011-04-18 | Discharge: 2011-04-18 | Disposition: A | Discharge status: Home / Self Care | Payer: BC Managed Care – PPO | Source: Ambulatory Visit | Attending: Internal Medicine | Admitting: Internal Medicine

## 2011-04-18 DIAGNOSIS — R05 Cough: Secondary | ICD-10-CM | POA: Insufficient documentation

## 2011-04-18 DIAGNOSIS — R062 Wheezing: Secondary | ICD-10-CM | POA: Insufficient documentation

## 2011-04-18 DIAGNOSIS — R0602 Shortness of breath: Secondary | ICD-10-CM | POA: Insufficient documentation

## 2011-04-18 DIAGNOSIS — R059 Cough, unspecified: Secondary | ICD-10-CM | POA: Insufficient documentation

## 2011-04-18 LAB — PULMONARY FUNCTION TEST

## 2011-04-18 MED ORDER — METHACHOLINE 1 MG/ML NEB SOLN
2.0000 mL | Freq: Once | RESPIRATORY_TRACT | Status: AC
  Administered 2011-04-18: 2 mg via RESPIRATORY_TRACT

## 2011-04-18 MED ORDER — METHACHOLINE 16 MG/ML NEB SOLN
2.0000 mL | Freq: Once | RESPIRATORY_TRACT | Status: AC
  Administered 2011-04-18: 32 mg via RESPIRATORY_TRACT

## 2011-04-18 MED ORDER — ALBUTEROL SULFATE (5 MG/ML) 0.5% IN NEBU
2.5000 mg | INHALATION_SOLUTION | Freq: Once | RESPIRATORY_TRACT | Status: AC
  Administered 2011-04-18: 2.5 mg via RESPIRATORY_TRACT

## 2011-04-18 MED ORDER — METHACHOLINE 4 MG/ML NEB SOLN
2.0000 mL | Freq: Once | RESPIRATORY_TRACT | Status: AC
  Administered 2011-04-18: 8 mg via RESPIRATORY_TRACT

## 2011-04-18 MED ORDER — METHACHOLINE 0.0625 MG/ML NEB SOLN
2.0000 mL | Freq: Once | RESPIRATORY_TRACT | Status: AC
  Administered 2011-04-18: 0.125 mg via RESPIRATORY_TRACT

## 2011-04-18 MED ORDER — METHACHOLINE 0.25 MG/ML NEB SOLN
2.0000 mL | Freq: Once | RESPIRATORY_TRACT | Status: AC
  Administered 2011-04-18: 0.5 mg via RESPIRATORY_TRACT

## 2011-04-18 MED ORDER — SODIUM CHLORIDE 0.9 % IN NEBU
3.0000 mL | INHALATION_SOLUTION | Freq: Once | RESPIRATORY_TRACT | Status: AC
  Administered 2011-04-18: 3 mL via RESPIRATORY_TRACT
  Filled 2011-04-18: qty 3

## 2011-04-24 ENCOUNTER — Telehealth: Payer: Self-pay | Admitting: Internal Medicine

## 2011-04-24 NOTE — Telephone Encounter (Signed)
Spoke with pt and notified of recs per MW. Appt scheduled for 9 am tomorrow and I spoke with Marcelino Duster at [facility] and asked her to fax MCT results so he can discuss with pt at [facility].

## 2011-04-24 NOTE — Telephone Encounter (Signed)
Can't treat this over the phone and not sure when the mct will be read but I see if we can get the raw data faxed over and offer to go over it with her tomorrow as an add on to my schedule

## 2011-04-24 NOTE — Telephone Encounter (Signed)
I spoke with the pt and she is asking for Methacholine test result done on 04-18-11. She also states there her cough and wheezing is not improved. She states it is not worse, but no better. She also c/o having ear pain now as well ans feels she might has ear infection. Pt states she is taking all meds as directed. Please advise. Carron Curie, CMA

## 2011-04-25 ENCOUNTER — Ambulatory Visit (INDEPENDENT_AMBULATORY_CARE_PROVIDER_SITE_OTHER): Payer: BC Managed Care – PPO | Admitting: Internal Medicine

## 2011-04-25 ENCOUNTER — Encounter: Payer: Self-pay | Admitting: Internal Medicine

## 2011-04-25 VITALS — BP 100/70 | HR 62 | Temp 98.2°F | Ht 62.0 in | Wt 148.4 lb

## 2011-04-25 DIAGNOSIS — R059 Cough, unspecified: Secondary | ICD-10-CM

## 2011-04-25 DIAGNOSIS — R05 Cough: Secondary | ICD-10-CM

## 2011-04-25 MED ORDER — TRAMADOL HCL 50 MG PO TABS: ORAL_TABLET | ORAL | Status: AC

## 2011-04-25 MED ORDER — PREDNISONE (PAK) 10 MG PO TABS: ORAL_TABLET | ORAL | Status: AC

## 2011-04-25 MED ORDER — ESOMEPRAZOLE MAGNESIUM 40 MG PO CPDR: 40.0000 mg | DELAYED_RELEASE_CAPSULE | Freq: Every day | ORAL | Status: DC

## 2011-04-25 NOTE — Assessment & Plan Note (Signed)
-   CT chest 06/19/2010 neg for bronchiectasis   - Sinus CT 03/29/2011 Essentially negative paranasal sinuses; minimal left maxillary mucosal thickening without evidence of acute sinusitis > trial of 1st gen H1 rec  - Allergy profile 04/04/11 > no eos, IgE 18.7 > mild allergy to ragweed/ grasses  Of the three most common causes of chronic cough, only one (GERD)  can actually cause the other two (asthma and post nasal drip syndrome)  and perpetuate the cylce of cough inducing airway trauma, inflammation, heightened sensitivity to reflux which is prompted by the cough itself via a cyclical mechanism.    This may partially respond to steroids and look like asthma and post nasal drainage but never erradicated completely unless the cough and the secondary reflux are eliminated, preferably both at the same time.  While not intuitively obvious, many patients with chronic low grade reflux do not cough until there is a secondary insult that disturbs the protective epithelial barrier and exposes sensitive nerve endings.  This can be viral or direct physical injury such as with an endotracheal tube.   The point is that once this occurs, it is difficult to eliminate using anything but a maximally effective acid suppression regimen at least in the short run, accompanied by an appropriate diet to address non acid GERD.   See instructions for specific recommendations which were reviewed directly with the patient who was given a copy with highlighter outlining the key components.

## 2011-04-25 NOTE — Patient Instructions (Signed)
The key to effective treatment for your cough is eliminating the non-stop cycle of cough you're stuck in long enough to let your airway heal completely and then see if there is anything still making you cough once you stop the cough suppression, but this should take no more than 5 days to figuure out  First take  mucinex dm 1200 mg every 12 hours and supplement if needed with  tramadol 50 mg up to 2 every 4 hours to suppress the urge to cough. Swallowing water or using ice chips/non mint and menthol containing candies (such as lifesavers or sugarless jolly ranchers) are also effective.  You should rest your voice and avoid activities that you know make you cough.  Once you have eliminated the cough for 3 straight days try reducing the tramadol first,  then the mucinex dm.   Try nexium 40 mg  Take 30-60 min before first meal of the day and Pepcid 20 mg one bedtime until cough is completely gone for at least a week without the need for cough suppression  Prednisone 10 mg take  4 each am x 2 days,   2 each am x 2 days,  1 each am x2days and stop    Stop symbicort   Only use albuterol if you can't catch your breath after you stop the cough  See Tammy NP w/in 1 weeks with all your medications, even over the counter meds, separated in two separate bags, the ones you take no matter what vs the ones you stop once you feel better and take only as needed when you feel you need them.   Tammy  will generate for you a new user friendly medication calendar that will put [place] all on the same page re: your medication use.     Without this process, it simply isn't possible to assure that we are providing  your outpatient care  with  the attention to detail we feel you deserve.   If we cannot assure that you're getting that kind of care,  then we cannot manage your problem effectively from this clinic.  Once you have seen Tammy and we are sure that we're all on the same page with your medication use she will arrange  follow up with me.

## 2011-04-25 NOTE — Progress Notes (Signed)
Subjective:    Patient ID: Amanda [name], female    DOB: 22-Aug-1960   MRN: [id number]    Brief patient profile:  50 yowf outgrew [age] age around 77-9 with a lot of of noct wheeze and smoked only  in 20's tendency to bronchitis starting in her 30s maybe once a year and fine between flares and that pattern lasted until 2009-2010 more freq severe and lasts longer and persistent since 10/2010 and therefore referred 03/20/2011 by Dr Azucena Cecil with following w/u complete    - CT chest 06/19/2010 neg for bronchiectasis   - Sinus CT 03/29/2011 Essentially negative paranasal sinuses; minimal left maxillary mucosal thickening without evidence of acute sinusitis > trial of 1st gen H1 rec  - Allergy profile 04/04/11 > no eos, IgE 18.7 > mild allergy to ragweed/ grasses   HPI 03/20/2011 1st pulmonary ov cc sob/wheeze/am coughing abupt onset 2 days p flu [facility].  Started nexium 2/7 once daily before bfast.  Cough can be quite violent/ grinding in quality, but minimal mucus produced and worse day than night. Not much better with prednisone.  rec  Work on inhaler technique:      Symbicort 80 Take 2 puffs first thing in am and then another 2 puffs about 12 hours later. Only use your albuterol as a rescue medication .   The key to effective treatment for your cough is eliminating the non-stop cycle of cough you're stuck in long enough to let your airway heal completely and then see if there is anything still making you cough once you stop the cough suppression, but this should take no more than 5 days to figuure out  First take delsym two tsp every 12 hours and supplement if needed with  tramadol 50 mg up to 2 every 4 hours to suppress the urge to cough.  Try prilosec 20mg (nexium40)   Take 30-60 min before first meal of the day and Pepcid 20 mg one bedtime GERD diet. Omincef 300 mg twice daily x 10 days  Prednisone 10 mg take  4 each am x 2 days,   2 each am x 2 days,  1 each am  x2days and stop   CT Sinus CT before the visit> minimally thickened> chlortrimeton 4 mg   04/04/2011 f/u ov/Amanda [name] cc better but still coughing 30 m after shower, not at all while sleeping, mild doe like on one flight of steps and no excess mucus production, some better with symbicort. Increase the symbicort to 160 Take 2 puffs first thing in am and then another 2 puffs about 12 hours later.  Use chlortrimeton 4 mg up every 6 hours to suppress excess nasal  drainage  04/18/10 MCT> neg asthma   04/25/2011 f/u ov/Amanda [name] cc 24 h cough , minimal yellow mucus, abx only help x 2days (ie the first 2 she takes them) nothing stops the cough completely but did not ever take more than one tramadol and not taking nexium ac as rec. No overt sinus or reflux symptoms.    Also denies any obvious fluctuation of symptoms with weather or environmental changes or other aggravating or alleviating factors except as outlined above   ROS  At present neg for  any significant sore throat, dysphagia, itching, sneezing,  nasal congestion or excess/ purulent secretions,  fever, chills, sweats, unintended wt loss, pleuritic or exertional cp, hempoptysis, orthopnea pnd or leg swelling.  Also denies presyncope, palpitations, heartburn, abdominal pain, nausea, vomiting, diarrhea  or  change in bowel or urinary habits, dysuria,hematuria,  rash, arthralgias, visual complaints, headache, numbness weakness or ataxia.          Objective:   Physical Exam  Somewhat somber amb wf nad minimal spont cough  Wt 146 03/20/11 > 04/04/2011 146 >04/25/2011  148   HEENT: nl dentition, turbinates, and orophanx. Nl external ear canals without cough reflex   NECK :  without JVD/Nodes/TM/ nl carotid upstrokes bilaterally   LUNGS: no acc muscle use, clear to A and P bilaterally without cough on insp or exp maneuvers and mod pseudowheeze   CV:  RRR  no s3 or murmur or increase in P2, no edema   ABD:  soft and nontender with nl excursion in the  supine position. No bruits or organomegaly, bowel sounds nl  MS:  warm without deformities, calf tenderness, cyanosis or clubbing           Assessment & Plan:

## 2011-05-01 ENCOUNTER — Encounter: Payer: Self-pay | Admitting: Internal Medicine

## 2011-05-09 ENCOUNTER — Encounter: Payer: BC Managed Care – PPO | Admitting: Adult Health

## 2011-06-03 ENCOUNTER — Other Ambulatory Visit: Payer: Self-pay | Admitting: Obstetrics and Gynecology

## 2011-06-03 DIAGNOSIS — R928 Other abnormal and inconclusive findings on diagnostic imaging of breast: Secondary | ICD-10-CM

## 2011-06-04 ENCOUNTER — Ambulatory Visit
Admission: RE | Admit: 2011-06-04 | Discharge: 2011-06-04 | Disposition: A | Discharge status: Home / Self Care | Payer: BC Managed Care – PPO | Source: Ambulatory Visit | Attending: Obstetrics and Gynecology | Admitting: Obstetrics and Gynecology

## 2011-06-04 DIAGNOSIS — R928 Other abnormal and inconclusive findings on diagnostic imaging of breast: Secondary | ICD-10-CM

## 2011-11-20 ENCOUNTER — Other Ambulatory Visit: Payer: Self-pay | Admitting: Obstetrics and Gynecology

## 2011-11-20 DIAGNOSIS — N6489 Other specified disorders of breast: Secondary | ICD-10-CM

## 2011-12-09 ENCOUNTER — Ambulatory Visit
Admission: RE | Admit: 2011-12-09 | Discharge: 2011-12-09 | Disposition: A | Discharge status: Home / Self Care | Payer: BC Managed Care – PPO | Source: Ambulatory Visit | Attending: Obstetrics and Gynecology | Admitting: Obstetrics and Gynecology

## 2011-12-09 DIAGNOSIS — N6489 Other specified disorders of breast: Secondary | ICD-10-CM

## 2012-09-17 ENCOUNTER — Other Ambulatory Visit: Payer: Self-pay | Admitting: Obstetrics and Gynecology

## 2012-09-17 DIAGNOSIS — N6489 Other specified disorders of breast: Secondary | ICD-10-CM

## 2012-10-06 ENCOUNTER — Ambulatory Visit
Admission: RE | Admit: 2012-10-06 | Discharge: 2012-10-06 | Disposition: A | Discharge status: Home / Self Care | Payer: BC Managed Care – PPO | Source: Ambulatory Visit | Attending: Obstetrics and Gynecology | Admitting: Obstetrics and Gynecology

## 2012-10-06 DIAGNOSIS — N6489 Other specified disorders of breast: Secondary | ICD-10-CM

## 2013-11-30 ENCOUNTER — Other Ambulatory Visit: Payer: Self-pay | Admitting: Obstetrics and Gynecology

## 2013-11-30 DIAGNOSIS — N6489 Other specified disorders of breast: Secondary | ICD-10-CM

## 2013-12-06 ENCOUNTER — Ambulatory Visit
Admission: RE | Admit: 2013-12-06 | Discharge: 2013-12-06 | Disposition: A | Discharge status: Home / Self Care | Payer: BC Managed Care – PPO | Source: Ambulatory Visit | Attending: Obstetrics and Gynecology | Admitting: Obstetrics and Gynecology

## 2013-12-06 DIAGNOSIS — N6489 Other specified disorders of breast: Secondary | ICD-10-CM

## 2014-03-23 ENCOUNTER — Other Ambulatory Visit (INDEPENDENT_AMBULATORY_CARE_PROVIDER_SITE_OTHER): Payer: 59

## 2014-03-23 ENCOUNTER — Encounter: Payer: Self-pay | Admitting: Family Medicine

## 2014-03-23 ENCOUNTER — Ambulatory Visit (INDEPENDENT_AMBULATORY_CARE_PROVIDER_SITE_OTHER): Payer: 59 | Admitting: Family Medicine

## 2014-03-23 VITALS — BP 110/68 | HR 73 | Wt 147.0 lb

## 2014-03-23 DIAGNOSIS — M79672 Pain in left foot: Secondary | ICD-10-CM

## 2014-03-23 DIAGNOSIS — M79671 Pain in right foot: Secondary | ICD-10-CM

## 2014-03-23 DIAGNOSIS — M779 Enthesopathy, unspecified: Secondary | ICD-10-CM

## 2014-03-23 DIAGNOSIS — M7750 Other enthesopathy of unspecified foot: Secondary | ICD-10-CM | POA: Insufficient documentation

## 2014-03-23 LAB — FERRITIN: Ferritin: 52.5 ng/mL (ref 10.0–291.0)

## 2014-03-23 LAB — VITAMIN D 25 HYDROXY (VIT D DEFICIENCY, FRACTURES): VITD: 14.3 ng/mL — ABNORMAL LOW (ref 30.00–100.00)

## 2014-03-23 LAB — T4, FREE: Free T4: 0.84 ng/dL (ref 0.60–1.60)

## 2014-03-23 LAB — TSH: TSH: 1.6 u[IU]/mL (ref 0.35–4.50)

## 2014-03-23 LAB — SEDIMENTATION RATE: Sed Rate: 27 mm/hr — ABNORMAL HIGH (ref 0–22)

## 2014-03-23 LAB — C-REACTIVE PROTEIN: CRP: 0.3 mg/dL — ABNORMAL LOW (ref 0.5–20.0)

## 2014-03-23 MED ORDER — PREDNISONE 50 MG PO TABS: 50.0000 mg | ORAL_TABLET | Freq: Every day | ORAL | Status: DC

## 2014-03-23 NOTE — Progress Notes (Signed)
Pre visit review using our clinic review tool, if applicable. No additional management support is needed unless otherwise documented below in the visit note. 

## 2014-03-23 NOTE — Patient Instructions (Addendum)
Good to meet you Ice bath 20 minutes at night No exercises for a week then only 3 times a week.  Prednisone daily for 5 days  Vitamin D 2000 IU daily Turmeric 500-mg twice daily Fish oil 2 grams daily Wear boot for next 2 weeks.  See me in 2 weeks and we will see how you are doing.

## 2014-03-23 NOTE — Assessment & Plan Note (Signed)
I believe the patient is having more of a capsulitis of the ankle mostly posteriorly. I do think the differential also includes an early fibromatosis after surgical intervention of the plantar fascial. Patient's pain could also be secondary to the irritation of the fat pad in the area. Patient has tried topical anti-inflammatories we will do systemic steroids to see if we can decrease this quickly. Addition of this patient was put in a Cam Walker boot to see if we can actually decreased the pain. Patient is having bilateral's we will start with the left foot. We discussed an icing regimen as well as over-the-counter natural supplementations a try. I like to rule out any other metabolic abnormality that could be also contributing and labs were taken today. Patient and will come back and see me again in 2 weeks to make sure we are making some improvement.

## 2014-03-23 NOTE — Progress Notes (Signed)
[name] [address] [place], [place] [address] Phone: [phone] Subjective:    I'm seeing this patient by the request  of:  Gara Kroner, MD   CC: Bilateral foot pain  [id number] Amanda [name] is a 54 y.o. female coming in with complaint of bilateral foot pain. Patient has had a history of plantar fasciitis for quite some time. Patient was being seen by an orthopedic specialist and had numerous injections with no significant improvement. Patient also undergone a release of the plantar fasciitis bilaterally. Patient states unfortunately that the surgery seem to actually worsen the pain. Patient has had so much pain she was needing pain medication on a regular basis. Patient states that her orthopedic surgeon had exhausted all other options and patient continues with physical therapy without any significant improvement. Patient states most of the pain seems to be mostly over the medial calcaneal region and can radiate towards the plantar aspect of the foot. Patient states that this pain is so severe that it is affecting her daily activities and can even wake her up at night. Patient at this point is attempting to avoid any pain medications but sometimes needs them on a regular basis. Patient has tried topical gels as well. Patient rates the severity of pain as 9 out of 10 because it is affecting her daily activities.     Past medical history, social, surgical and family history all reviewed in electronic medical record.   Review of Systems: No headache, visual changes, nausea, vomiting, diarrhea, constipation, dizziness, abdominal pain, skin rash, fevers, chills, night sweats, weight loss, swollen lymph nodes, body aches, joint swelling, muscle aches, chest pain, shortness of breath, mood changes.   Objective Blood pressure 110/68, pulse 73, weight 147 lb (66.679 kg), SpO2 97 %.  General: No apparent distress alert and oriented x3 mood and affect  normal, dressed appropriately.  HEENT: Pupils equal, extraocular movements intact  Respiratory: Patient's speak in full sentences and does not appear short of breath  Cardiovascular: No lower extremity edema, non tender, no erythema  Skin: Warm dry intact with no signs of infection or rash on extremities or on axial skeleton.  Abdomen: Soft nontender  Neuro: Cranial nerves II through XII are intact, neurovascularly intact in all extremities with 2+ DTRs and 2+ pulses.  Lymph: No lymphadenopathy of posterior or anterior cervical chain or axillae bilaterally.  Gait normal with good balance and coordination.  MSK:  Non tender with full range of motion and good stability and symmetric strength and tone of shoulders, elbows, wrist, hip, knee and ankles bilaterally.  Foot exam shows the patient does have some mild longitudinal breakdown as well as some mild overpronation of the hindfeet bilaterally left greater than right. Patient also states that there is some mild hallux rigidus bilaterally. Patient does have some mild breakdown of the transverse arch bilaterally. Patient does have some mild swelling from the previous surgeries still noted over the calcaneal medial area. Full range of motion of the ankle with good strength.  MSK US performed of: Right foot This study was ordered, performed, and interpreted by Charlann Boxer D.O.  Foot/Ankle:   All structures visualized.   Talar dome unremarkable  Ankle mortise without effusion. Peroneus longus and brevis tendons unremarkable on long and transverse views without sheath effusions. Posterior tibialis, flexor hallucis longus, and flexor digitorum longus tendons unremarkable on long and transverse views without sheath effusions. Achilles tendon visualized along length of tendon and unremarkable on long  and transverse views without sheath effusion. Anterior Talofibular Ligament and Calcaneofibular Ligaments unremarkable and intact. Deltoid Ligament  unremarkable and intact. Plantar fascia does so surgical changes. Patient also has a varicose vein in the area as well as increasing Doppler flow. No specific masses noted.   IMPRESSION: Continuing irritation to the insertion of what was the plantar fascia.      Impression and Recommendations:     This case required medical decision making of moderate complexity.

## 2014-03-24 LAB — ANA: Anti Nuclear Antibody(ANA): NEGATIVE

## 2014-03-24 LAB — PTH, INTACT AND CALCIUM
Calcium: 10 mg/dL (ref 8.4–10.5)
PTH: 68 pg/mL — ABNORMAL HIGH (ref 14–64)

## 2014-04-06 ENCOUNTER — Encounter: Payer: Self-pay | Admitting: Family Medicine

## 2014-04-06 ENCOUNTER — Ambulatory Visit (INDEPENDENT_AMBULATORY_CARE_PROVIDER_SITE_OTHER): Payer: 59 | Admitting: Family Medicine

## 2014-04-06 VITALS — BP 106/70 | HR 72 | Ht 62.0 in | Wt 148.0 lb

## 2014-04-06 DIAGNOSIS — M779 Enthesopathy, unspecified: Secondary | ICD-10-CM

## 2014-04-06 DIAGNOSIS — M7750 Other enthesopathy of unspecified foot: Secondary | ICD-10-CM

## 2014-04-06 NOTE — Assessment & Plan Note (Signed)
Patient did have more of a capsulitis of the posterior capsule. Patient is responding very well to the anti-inflammatories, icing protocol and was in the [employer]. Patient slowly progress out of the Cam Walker over the course of next 2 weeks. We discussed continuing the icing at night. Patient will get off the prednisone here after her bronchitis treatment. We discussed that if patient has any worsening pain to go back into the [employer]. We discussed the patient does not make any significant improvement PRP injections may be necessary. As long as patient is doing much better at this time we will have her follow-up in 4 weeks. She will be fitted for orthotics in 2 weeks.

## 2014-04-06 NOTE — Progress Notes (Signed)
  [facility] [address] [place], [place] [address] Phone: [phone] Subjective:    I'm seeing this patient by the request  of:  Gara Kroner, MD   CC: Bilateral foot pain  [id number] Amanda [name] is a 54 y.o. female coming in with complaint of bilateral foot pain. Patient was seen previously and please see previous note for patient's past medical history for this problem. Patient states though that since she's been in the [facility] and take the prednisone she was complete pain free for multiple days. States that the pain is starting to come back slowly since she's been off the medicine. Patient states that she is approximately 70-80% better. Patient describes it as more of a dull aching pain. Able to do daily activities without any discomfort.     Past medical history, social, surgical and family history all reviewed in electronic medical record.   Review of Systems: No headache, visual changes, nausea, vomiting, diarrhea, constipation, dizziness, abdominal pain, skin rash, fevers, chills, night sweats, weight loss, swollen lymph nodes, body aches, joint swelling, muscle aches, chest pain, shortness of breath, mood changes.   Objective Blood pressure 106/70, pulse 72, height 5\' 2"  (1.575 m), weight 148 lb (67.132 kg), SpO2 94 %.  General: No apparent distress alert and oriented x3 mood and affect normal, dressed appropriately.  HEENT: Pupils equal, extraocular movements intact  Respiratory: Patient's speak in full sentences and does not appear short of breath  Cardiovascular: No lower extremity edema, non tender, no erythema  Skin: Warm dry intact with no signs of infection or rash on extremities or on axial skeleton.  Abdomen: Soft nontender  Neuro: Cranial nerves II through XII are intact, neurovascularly intact in all extremities with 2+ DTRs and 2+ pulses.  Lymph: No lymphadenopathy of posterior or anterior cervical chain or axillae  bilaterally.  Gait normal with good balance and coordination.  MSK:  Non tender with full range of motion and good stability and symmetric strength and tone of shoulders, elbows, wrist, hip, knee and ankles bilaterally.  Foot exam shows the patient does have some mild longitudinal breakdown as well as some mild overpronation of the hindfeet bilaterally left greater than right. Patient also states that there is some mild hallux rigidus bilaterally. Patient does have some mild breakdown of the transverse arch bilaterally. Significant decrease in swelling and fullness as well as appreciated near the calcaneal region previously. Full range of motion of the ankle with good strength.  MSK US performed of: Right foot This study was ordered, performed, and interpreted by Charlann Boxer D.O.  Foot/Ankle:   All structures visualized.   Talar dome unremarkable  Ankle mortise without effusion. Peroneus longus and brevis tendons unremarkable on long and transverse views without sheath effusions. Posterior tibialis, flexor hallucis longus, and flexor digitorum longus tendons unremarkable on long and transverse views without sheath effusions. Achilles tendon visualized along length of tendon and unremarkable on long and transverse views without sheath effusion. Anterior Talofibular Ligament and Calcaneofibular Ligaments unremarkable and intact. Deltoid Ligament unremarkable and intact. Plantar fascia does so surgical changes. Decreased hypoechoic changes noted.   IMPRESSION: Significant decrease in inflammation from previous exam.      Impression and Recommendations:     This case required medical decision making of moderate complexity.

## 2014-04-06 NOTE — Patient Instructions (Signed)
Good to see you You are doing great Continue the boot with a lot of walking for the next 2 weeks.  We will get you orthotics  Continue the exercises See me in 4 weeks.

## 2014-04-18 ENCOUNTER — Ambulatory Visit (INDEPENDENT_AMBULATORY_CARE_PROVIDER_SITE_OTHER): Payer: 59 | Admitting: Family Medicine

## 2014-04-18 ENCOUNTER — Encounter: Payer: Self-pay | Admitting: Family Medicine

## 2014-04-18 DIAGNOSIS — M779 Enthesopathy, unspecified: Secondary | ICD-10-CM

## 2014-04-18 DIAGNOSIS — M7750 Other enthesopathy of unspecified foot: Secondary | ICD-10-CM

## 2014-04-18 NOTE — Progress Notes (Signed)
Patient was fitted for a : standard, cushioned, semi-rigid orthotic. The orthotic was heated and afterward the patient was in a seated position and the orthotic molded. The patient was positioned in subtalar neutral position and 10 degrees of ankle dorsiflexion in a non-weight bearing stance. After completion of molding, patient did have orthotic management which included instructions on acclimating to the orthotics, signs of ill fit as well as care for the orthotic.   The blank was ground to a stable position for weight bearing. Size: 7 (Igli Heel Spur Light) Base: Carbon fiber Additional Posting and Padding: The following postings were fitted onto the molded orthotics to help maintain a talar neutral position - Wedge posting for transverse arch: none   Silicone posting for longitudinal arch: 250/70 bilaterally    A wedge post was also placed laterally on the left foot to help support for lateral ankle instability.  The patient ambulated these, and they were very comfortable and supportive.

## 2014-04-18 NOTE — Assessment & Plan Note (Signed)
Placed in orthotics today. Please see patient chart shins. Patient will increase activity and the orthotic slowly. Patient and will come back and see me again in 2-4 weeks for further evaluation.

## 2014-04-18 NOTE — Patient Instructions (Signed)

## 2014-05-04 ENCOUNTER — Ambulatory Visit (INDEPENDENT_AMBULATORY_CARE_PROVIDER_SITE_OTHER): Payer: 59 | Admitting: Family Medicine

## 2014-05-04 ENCOUNTER — Encounter: Payer: Self-pay | Admitting: Family Medicine

## 2014-05-04 VITALS — BP 106/68 | HR 70 | Ht 62.0 in | Wt 149.0 lb

## 2014-05-04 DIAGNOSIS — M7750 Other enthesopathy of unspecified foot: Secondary | ICD-10-CM

## 2014-05-04 DIAGNOSIS — M779 Enthesopathy, unspecified: Secondary | ICD-10-CM

## 2014-05-04 NOTE — Progress Notes (Signed)
Pre visit review using our clinic review tool, if applicable. No additional management support is needed unless otherwise documented below in the visit note. 

## 2014-05-04 NOTE — Progress Notes (Signed)
[facility] [address] [place], [place] [address] Phone: [phone] Subjective:    I'm seeing this patient by the request  of:  Gara Kroner, MD   CC: Bilateral foot pain  [id number] LORRAINA [name] is a 54 y.o. female coming in with complaint of bilateral foot pain. Patient was seen previously and please see previous note for patient's past medical history for this problem. Patient was doing significantly better after getting ready the information and her foot. In addition of this patient's capsulitis was doing better and patient was put in custom orthotics. Patient states unfortunately the pain is starting to come back patient states that she feels that the swelling is starting to occur more. Patient is wearing the custom orthotics that were made for her and states that they have been somewhat helpful. Patient states that a lot of walking gives her more discomfort and patient is having pain even with sitting from time to time. Not as bad as it was previously but starting to get worse. No new symptoms is worsening a previous symptoms.     Past medical history, social, surgical and family history all reviewed in electronic medical record.   Review of Systems: No headache, visual changes, nausea, vomiting, diarrhea, constipation, dizziness, abdominal pain, skin rash, fevers, chills, night sweats, weight loss, swollen lymph nodes, body aches, joint swelling, muscle aches, chest pain, shortness of breath, mood changes.   Objective Blood pressure 106/68, pulse 70, height 5\' 2"  (1.575 m), weight 149 lb (67.586 kg), SpO2 95 %.  General: No apparent distress alert and oriented x3 mood and affect normal, dressed appropriately.  HEENT: Pupils equal, extraocular movements intact  Respiratory: Patient's speak in full sentences and does not appear short of breath  Cardiovascular: No lower extremity edema, non tender, no erythema  Skin: Warm dry intact with no  signs of infection or rash on extremities or on axial skeleton.  Abdomen: Soft nontender  Neuro: Cranial nerves II through XII are intact, neurovascularly intact in all extremities with 2+ DTRs and 2+ pulses.  Lymph: No lymphadenopathy of posterior or anterior cervical chain or axillae bilaterally.  Gait normal with good balance and coordination.  MSK:  Non tender with full range of motion and good stability and symmetric strength and tone of shoulders, elbows, wrist, hip, knee and ankles bilaterally.  Foot exam shows the patient does have some mild longitudinal breakdown as well as some mild overpronation of the hindfeet bilaterally left greater than right. Patient also states that there is some mild hallux rigidus bilaterally. Patient does have some mild breakdown of the transverse arch bilaterally. No signs of swelling the patient is significantly more uncomfortable over the lateral, medial as well as plantar aspect of the foot. No erythema and no bruising noted.  MSK US performed of: Right foot This study was ordered, performed, and interpreted by Charlann Boxer D.O.  Foot/Ankle:   All structures visualized.   Talar dome unremarkable  Ankle mortise without effusion. Peroneus longus and brevis tendons unremarkable on long and transverse views with increasing tendon sheath effusion from previous exam Posterior tibialis, flexor hallucis longus, and flexor digitorum longus tendons unremarkable on long and transverse views with minimal sheath effusion that was not seen previously. Achilles tendon visualized along length of tendon and unremarkable on long and transverse views without sheath effusion. Anterior Talofibular Ligament and Calcaneofibular Ligaments unremarkable and intact. Deltoid Ligament unremarkable and intact. Plantar fascia does so surgical changes. Mild increase in hypoechoic  changes.   IMPRESSION: Increase effusion over the peroneal tendons.      Impression and  Recommendations:     This case required medical decision making of moderate complexity.

## 2014-05-04 NOTE — Assessment & Plan Note (Addendum)
Patient does have some the capsulitis overall. We discussed the icing and home exercises on a regular basis. We discussed over-the-counter medications that may be beneficial as well. Patient will continue to wear the custom orthotics to try to decrease the inflammation. Patient states that the only time she is pain-free was when she was on the prednisone but we cannot do this long-term. We discussed that certain medications could cause inflammation including the hydrochlorothiazide and patient will decrease dose to have the amount the next 2 weeks and we'll see if patient response. Patient does then we will consider changing to amlodipine and we will discuss with primary care provider. Other than that I do not see any other possibilities. We may need advanced imaging if he continues. We also may need laboratory workup which patient has had previously but may be a need to repeat.  Spent  25 minutes with patient face-to-face and had greater than 50% of counseling including as described above in assessment and plan.

## 2014-05-04 NOTE — Patient Instructions (Signed)
Good to see you Continue the exercises and look at new handout Ice the ankle but not the foot Cut the HCTZ in half. Take for 2 weeks and watch blood pressure we may need to add a different medicine. Continue the orthotics See me again in 2 weeks.

## 2014-05-18 ENCOUNTER — Other Ambulatory Visit (INDEPENDENT_AMBULATORY_CARE_PROVIDER_SITE_OTHER): Payer: 59

## 2014-05-18 ENCOUNTER — Ambulatory Visit (INDEPENDENT_AMBULATORY_CARE_PROVIDER_SITE_OTHER): Payer: 59 | Admitting: Family Medicine

## 2014-05-18 ENCOUNTER — Encounter: Payer: Self-pay | Admitting: Family Medicine

## 2014-05-18 VITALS — BP 94/72 | HR 68 | Ht 62.0 in | Wt 149.0 lb

## 2014-05-18 DIAGNOSIS — I10 Essential (primary) hypertension: Secondary | ICD-10-CM

## 2014-05-18 DIAGNOSIS — M779 Enthesopathy, unspecified: Secondary | ICD-10-CM | POA: Diagnosis not present

## 2014-05-18 DIAGNOSIS — M7750 Other enthesopathy of unspecified foot: Secondary | ICD-10-CM

## 2014-05-18 NOTE — Patient Instructions (Signed)
Good to see you We did injections in the ankles today and I am optimistic Ice is your friend continue the exercises Wear braces with a lot of activity Consider stopping the HCTZ and ask him to check your uric acid.  See me again in 3-4 weeks.

## 2014-05-18 NOTE — Assessment & Plan Note (Signed)
Decrease Hydroclorthiazide

## 2014-05-18 NOTE — Progress Notes (Signed)
[facility] [place], [place] [address] Phone: [phone] Subjective:    I'm seeing this patient by the request  of:  Gara Kroner, MD   CC: Bilateral foot pain  [id number] Amanda [name] is a 54 y.o. female coming in with complaint of bilateral foot pain.  Patient has had a long history. Patient states muscle pain seems to be on the inferior aspect of the fibula bilaterally right greater than left. Patient has been treated more for peroneal tendon the patient does have what appears to be more of a capsulitis of the foot. Patient states that he can be extremely tender. Patient has been given prednisone and that didn't seem to help. Patient is also decrease her car thiazide which has helped too.      Past medical history, social, surgical and family history all reviewed in electronic medical record.   Review of Systems: No headache, visual changes, nausea, vomiting, diarrhea, constipation, dizziness, abdominal pain, skin rash, fevers, chills, night sweats, weight loss, swollen lymph nodes, body aches, joint swelling, muscle aches, chest pain, shortness of breath, mood changes.   Objective Blood pressure 94/72, pulse 68, height 5\' 2"  (1.575 m), weight 149 lb (67.586 kg), SpO2 96 %.  General: No apparent distress alert and oriented x3 mood and affect normal, dressed appropriately.  HEENT: Pupils equal, extraocular movements intact  Respiratory: Patient's speak in full sentences and does not appear short of breath  Cardiovascular: No lower extremity edema, non tender, no erythema  Skin: Warm dry intact with no signs of infection or rash on extremities or on axial skeleton.  Abdomen: Soft nontender  Neuro: Cranial nerves II through XII are intact, neurovascularly intact in all extremities with 2+ DTRs and 2+ pulses.  Lymph: No lymphadenopathy of posterior or anterior cervical chain or axillae bilaterally.  Gait normal with good balance and  coordination.  MSK:  Non tender with full range of motion and good stability and symmetric strength and tone of shoulders, elbows, wrist, hip, knee and ankles bilaterally.  Foot exam shows the patient does have some mild longitudinal breakdown as well as some mild overpronation of the hindfeet bilaterally left greater than right. Patient also states that there is some mild hallux rigidus bilaterally. Patient does have some mild breakdown of the transverse arch bilaterally. No signs of swelling the patient is significantly more uncomfortable over the lateral, medial as well as plantar aspect of the foot. No erythema and no bruising noted.no significant change. Pain is still worse over the anterior lateral aspect of the ankles.  Procedure: Real-time Ultrasound Guided Injection of bilateral anterior lateral capsule of the ankle mortise Device: GE Logiq E  Ultrasound guided injection is preferred based studies that show increased duration, increased effect, greater accuracy, decreased procedural pain, increased response rate, and decreased cost with ultrasound guided versus blind injection.  Verbal informed consent obtained.  Time-out conducted.  Noted no overlying erythema, induration, or other signs of local infection.  Skin prepped in a sterile fashion.  Local anesthesia: Topical Ethyl chloride.  With sterile technique and under real time ultrasound guidance: With a 25-gauge 1 inch needle patient was injected on separate occasions and the anterior lateral anterior capsule with picture saved. Patient had a total of 0.5 mL of 0.5% Marcaine and 0.5 mL of Kenalog 40 mg/dL and each joint.   Completed without difficulty  Pain immediately resolved suggesting accurate placement of the medication.  Advised to call if fevers/chills, erythema, induration,  drainage, or persistent bleeding.  Images permanently stored and available for review in the ultrasound unit.  Impression: Technically successful ultrasound  guided injection.      Impression and Recommendations:     This case required medical decision making of moderate complexity.

## 2014-05-18 NOTE — Progress Notes (Signed)
Pre visit review using our clinic review tool, if applicable. No additional management support is needed unless otherwise documented below in the visit note. 

## 2014-05-18 NOTE — Assessment & Plan Note (Signed)
Patient had bilateral injections today of the capsules and patient tolerated the procedure very well. I'm hoping that this will be beneficial. Discussed with patient about decrease in the hydrocodone thiazide with her blood pressure being so low. Patient will discuss with her primary care provider. We discussed continuing the other regimen and patient was given lateral ankle supports today. Patient and will come back and see me again in 3-4 weeks for further evaluation and treatment.

## 2014-06-06 ENCOUNTER — Ambulatory Visit: Payer: 59 | Admitting: Family Medicine

## 2014-07-18 ENCOUNTER — Ambulatory Visit (INDEPENDENT_AMBULATORY_CARE_PROVIDER_SITE_OTHER): Payer: 59 | Admitting: Family Medicine

## 2014-07-18 ENCOUNTER — Encounter: Payer: Self-pay | Admitting: Family Medicine

## 2014-07-18 VITALS — BP 124/78 | HR 65 | Ht 62.0 in | Wt 149.0 lb

## 2014-07-18 DIAGNOSIS — M779 Enthesopathy, unspecified: Secondary | ICD-10-CM | POA: Diagnosis not present

## 2014-07-18 DIAGNOSIS — M7750 Other enthesopathy of unspecified foot: Secondary | ICD-10-CM

## 2014-07-18 NOTE — Progress Notes (Signed)
Pre visit review using our clinic review tool, if applicable. No additional management support is needed unless otherwise documented below in the visit note. 

## 2014-07-18 NOTE — Progress Notes (Signed)
[name] [place], [place] [address] Phone: [phone] Subjective:    I'm seeing this patient by the request  of:  Gara Kroner, MD   CC: Bilateral foot painleft greater than right follow-up  [id number] Amanda [name] is a 54 y.o. female coming in with complaint of bilateral foot pain.  Patient has had a long history. Patient at last exam did have injections of the anterior capsule of the ankles bilaterally. Patient did tolerate those and states that it felt good for approximately 5 weeks but in the last week her pain is started to come back again. Patient states that she was walking on the lateral aspect of the left ankle she felt a popping sensation followed by pain and having difficulty weightbearing within minutes. Patient states that this lasted for approximately 1 week and then slowly started to improve. Patient denies any swelling or any numbness in the area. States that the pain in the heels is worsening as well. Patient has already had surgical intervention for her plantar fasciitis bilaterally. Patient is failed all other conservative therapy including formal physical therapy and we have already done a Cam Walker for some time. Patient did respond well to prednisone but workup for autoimmune diseasesshowed significant low vitamin D levels as well as a mild elevation and sedimentation been negative ANA. Patient states that this pain is starting to affect her daily activities.     Past medical history, social, surgical and family history all reviewed in electronic medical record.   Review of Systems: No headache, visual changes, nausea, vomiting, diarrhea, constipation, dizziness, abdominal pain, skin rash, fevers, chills, night sweats, weight loss, swollen lymph nodes, body aches, joint swelling, muscle aches, chest pain, shortness of breath, mood changes.   Objective Blood pressure 124/78, pulse 65, height 5\' 2"  (1.575 m), weight 149  lb (67.586 kg), SpO2 95 %.  General: No apparent distress alert and oriented x3 mood and affect normal, dressed appropriately.  HEENT: Pupils equal, extraocular movements intact  Respiratory: Patient's speak in full sentences and does not appear short of breath  Cardiovascular: No lower extremity edema, non tender, no erythema  Skin: Warm dry intact with no signs of infection or rash on extremities or on axial skeleton.  Abdomen: Soft nontender  Neuro: Cranial nerves II through XII are intact, neurovascularly intact in all extremities with 2+ DTRs and 2+ pulses.  Lymph: No lymphadenopathy of posterior or anterior cervical chain or axillae bilaterally.  Gait normal with good balance and coordination.  MSK:  Non tender with full range of motion and good stability and symmetric strength and tone of shoulders, elbows, wrist, hip, knee and ankles bilaterally.  Foot exam shows the patient does have some mild longitudinal breakdown as well as some mild overpronation of the hindfeet bilaterally left greater than right. Patient also states that there is some mild hallux rigidus bilaterally. Patient does have some mild breakdown of the transverse arch bilaterally. No signs of swelling the patient is significantly more uncomfortable over the lateral, medial as well as plantar aspect of the foot. No erythema and no bruising noted.no significant change. Pain is still worse over the anterior lateral aspect of the ankles.no specific findings at this time the pain over the plantar aspect of the heel and lateral aspects ankle left greater than right.       Impression and Recommendations:     This case required medical decision making of moderate complexity.

## 2014-07-18 NOTE — Assessment & Plan Note (Signed)
I believe the patient has had more of a capsulitis of the ankle as well as having a post surgical inflammation of the plantar aspect of the foot from the plantar fasciitis surgery. Patient continues to have difficulty and has failed all other conservative therapy at this time. We discussed different treatment options and patient has elected to try a PRP injection. Patient will be signed up for this in the next week. We discussed the possible benefits as well as concerns and patient is going to have more of a 3-6 week recovery period. Patient understands this and will come back. Patient will continue the other conservative therapy and the custom orthotics.  Spent  25 minutes with patient face-to-face and had greater than 50% of counseling including as described above in assessment and plan.

## 2014-07-18 NOTE — Patient Instructions (Signed)
Good to see you Continue the vitamins Tylenol 325-650mg  3 times a day can help with the arthritis.  We will get you ready for the PRP next week.  For the ankle continue what you are doing  Consider looking into [email or web address] and xlink ankle compression See you next week.

## 2014-07-26 ENCOUNTER — Ambulatory Visit (INDEPENDENT_AMBULATORY_CARE_PROVIDER_SITE_OTHER): Payer: 59 | Admitting: Family Medicine

## 2014-07-26 ENCOUNTER — Encounter: Payer: Self-pay | Admitting: Family Medicine

## 2014-07-26 ENCOUNTER — Ambulatory Visit (INDEPENDENT_AMBULATORY_CARE_PROVIDER_SITE_OTHER): Payer: 59

## 2014-07-26 VITALS — BP 120/82 | HR 63 | Wt 150.0 lb

## 2014-07-26 DIAGNOSIS — M79672 Pain in left foot: Secondary | ICD-10-CM

## 2014-07-26 DIAGNOSIS — M779 Enthesopathy, unspecified: Secondary | ICD-10-CM

## 2014-07-26 DIAGNOSIS — M7750 Other enthesopathy of unspecified foot: Secondary | ICD-10-CM

## 2014-07-26 NOTE — Progress Notes (Signed)
Procedure visit  Patient continued to have more of a plantar fasciitis status post surgery. Patient continued to have some subacute bursitis that has only responded to prednisone previously. Patient's left foot continued to give her pain with what appeared to be more of a capsulitis. and patient is here for a PRP injection.  Procedure: Real-time Ultrasound Guided Injection of left plantar fascial Device: GE Logiq E  Ultrasound guided injection is preferred based studies that show increased duration, increased effect, greater accuracy, decreased procedural pain, increased response rate, and decreased cost with ultrasound guided versus blind injection.  Verbal informed consent obtained.  Time-out conducted.  Noted no overlying erythema, induration, or other signs of local infection.  Skin prepped in a sterile fashion.  Local anesthesia: Topical Ethyl chloride.  With sterile technique and under real time ultrasound guidance:  With a 21-gauge 2 inch needle patient was injected with 4 mL of PRP. Completed without difficulty  Pain immediately resolved suggesting accurate placement of the medication.  Advised to call if fevers/chills, erythema, induration, drainage, or persistent bleeding.  Images permanently stored and available for review in the ultrasound unit.  Impression: Technically successful ultrasound guided injection.

## 2014-07-26 NOTE — Progress Notes (Signed)
Pre visit review using our clinic review tool, if applicable. No additional management support is needed unless otherwise documented below in the visit note. 

## 2014-07-26 NOTE — Patient Instructions (Signed)
Good to see you No weight bearing for 1 week No ice for 24 hours Back to boot in 1 week See me again in 2-3 weeks.

## 2014-07-26 NOTE — Assessment & Plan Note (Signed)
Patient given an injection today of PRP. Patient tolerated the procedure well. Patient be nonweightbearing for one week and then in a Cam Walker for 2 weeks. Patient come back in 3 weeks for further evaluation. Post injection instructions given.

## 2014-08-10 ENCOUNTER — Ambulatory Visit (INDEPENDENT_AMBULATORY_CARE_PROVIDER_SITE_OTHER): Payer: 59 | Admitting: Family Medicine

## 2014-08-10 ENCOUNTER — Encounter: Payer: Self-pay | Admitting: Family Medicine

## 2014-08-10 ENCOUNTER — Other Ambulatory Visit: Payer: Self-pay | Admitting: Obstetrics and Gynecology

## 2014-08-10 VITALS — BP 118/74 | HR 71 | Ht 62.0 in | Wt 149.0 lb

## 2014-08-10 DIAGNOSIS — M7750 Other enthesopathy of unspecified foot: Secondary | ICD-10-CM

## 2014-08-10 DIAGNOSIS — M779 Enthesopathy, unspecified: Secondary | ICD-10-CM

## 2014-08-10 DIAGNOSIS — N644 Mastodynia: Secondary | ICD-10-CM

## 2014-08-10 NOTE — Patient Instructions (Signed)
Good to see you Say bye to the boot Ice can help We will monitor the fibroma.  Start biking now Walk in the 3rd week See me again in 3-4 weeks.

## 2014-08-10 NOTE — Progress Notes (Signed)
  [name] [place], [place] [address] Phone: [phone] Subjective:    I'm seeing this patient by the request  of:  SWAYNE,DAVID W, MD   CC: Bilateral foot painleft greater than right  S/p prp in left heel.   [id number] Amanda [name] is a 54 y.o. female coming in with complaint of bilateral foot pain.  Patient is failed all other conservative therapy for a posterior capsulitis including surgery previous to seeing me. Patient did elect to have a PRP injection in the left heel. Patient states that she may be somewhat [name] but can tell because she is been wearing the Cam Walker. Patient denies any new symptoms. Patient has had a fibroma in the foot and states that this seems to be giving her more pain while she is wearing the Cam Walker.     Past medical history, social, surgical and family history all reviewed in electronic medical record.   Review of Systems: No headache, visual changes, nausea, vomiting, diarrhea, constipation, dizziness, abdominal pain, skin rash, fevers, chills, night sweats, weight loss, swollen lymph nodes, body aches, joint swelling, muscle aches, chest pain, shortness of breath, mood changes.   Objective Blood pressure 118/74, pulse 71, height 5\' 2"  (1.575 m), weight 149 lb (67.586 kg), SpO2 96 %.  General: No apparent distress alert and oriented x3 mood and affect normal, dressed appropriately.  HEENT: Pupils equal, extraocular movements intact  Respiratory: Patient's speak in full sentences and does not appear short of breath  Cardiovascular: No lower extremity edema, non tender, no erythema  Skin: Warm dry intact with no signs of infection or rash on extremities or on axial skeleton.  Abdomen: Soft nontender  Neuro: Cranial nerves II through XII are intact, neurovascularly intact in all extremities with 2+ DTRs and 2+ pulses.  Lymph: No lymphadenopathy of posterior or anterior cervical chain or axillae  bilaterally.  Gait normal with good balance and coordination.  MSK:  Non tender with full range of motion and good stability and symmetric strength and tone of shoulders, elbows, wrist, hip, knee and ankles bilaterally.  Foot exam shows the patient does have some mild longitudinal breakdown as well as some mild overpronation of the hindfeet bilaterally left greater than right.  Patient also states that there is some mild hallux rigidus bilaterally. What appears to be a fibroma in the flexor tendon of the pollex tendon. Left side.  Patient does have some mild breakdown of the transverse arch bilaterally.Marland Kitchen No erythema and no bruising note.no significant change. Non-tender today.  Limited ultrasound shows the patient's plantar fascia measurement from 1.42 cm down to 0.47 cm       Impression and Recommendations:     This case required medical decision making of moderate complexity.

## 2014-08-10 NOTE — Progress Notes (Signed)
Pre visit review using our clinic review tool, if applicable. No additional management support is needed unless otherwise documented below in the visit note. 

## 2014-08-10 NOTE — Assessment & Plan Note (Signed)
Injection from PRP 2 weeks ago at this time. Patient is doing relatively well. We are going to progress patient into walking shoes but will avoid her doing hard walking for the next 2 weeks. We discussed the icing and continuing the vitamins. Patient will come back and see me again in 3 weeks for further evaluation and treatment

## 2014-08-11 ENCOUNTER — Ambulatory Visit
Admission: RE | Admit: 2014-08-11 | Discharge: 2014-08-11 | Disposition: A | Discharge status: Home / Self Care | Payer: 59 | Source: Ambulatory Visit | Attending: Obstetrics and Gynecology | Admitting: Obstetrics and Gynecology

## 2014-08-11 DIAGNOSIS — N644 Mastodynia: Secondary | ICD-10-CM

## 2014-09-07 ENCOUNTER — Ambulatory Visit: Payer: 59 | Admitting: Family Medicine

## 2014-12-14 ENCOUNTER — Other Ambulatory Visit: Payer: Self-pay | Admitting: Obstetrics and Gynecology

## 2014-12-14 DIAGNOSIS — N644 Mastodynia: Secondary | ICD-10-CM

## 2015-01-02 ENCOUNTER — Ambulatory Visit
Admission: RE | Admit: 2015-01-02 | Discharge: 2015-01-02 | Disposition: A | Discharge status: Home / Self Care | Payer: 59 | Source: Ambulatory Visit | Attending: Obstetrics and Gynecology | Admitting: Obstetrics and Gynecology

## 2015-01-02 DIAGNOSIS — N644 Mastodynia: Secondary | ICD-10-CM

## 2015-09-19 ENCOUNTER — Other Ambulatory Visit: Payer: Self-pay | Admitting: Obstetrics and Gynecology

## 2015-09-19 DIAGNOSIS — N63 Unspecified lump in unspecified breast: Secondary | ICD-10-CM

## 2015-09-21 ENCOUNTER — Ambulatory Visit
Admission: RE | Admit: 2015-09-21 | Discharge: 2015-09-21 | Disposition: A | Discharge status: Home / Self Care | Payer: 59 | Source: Ambulatory Visit | Attending: Obstetrics and Gynecology | Admitting: Obstetrics and Gynecology

## 2015-09-21 DIAGNOSIS — N63 Unspecified lump in unspecified breast: Secondary | ICD-10-CM

## 2015-12-12 ENCOUNTER — Other Ambulatory Visit: Payer: Self-pay | Admitting: Obstetrics and Gynecology

## 2015-12-12 DIAGNOSIS — N6320 Unspecified lump in the left breast, unspecified quadrant: Secondary | ICD-10-CM

## 2016-01-03 ENCOUNTER — Ambulatory Visit
Admission: RE | Admit: 2016-01-03 | Discharge: 2016-01-03 | Disposition: A | Discharge status: Home / Self Care | Payer: 59 | Source: Ambulatory Visit | Attending: Obstetrics and Gynecology | Admitting: Obstetrics and Gynecology

## 2016-01-03 DIAGNOSIS — N6320 Unspecified lump in the left breast, unspecified quadrant: Secondary | ICD-10-CM

## 2016-07-01 ENCOUNTER — Other Ambulatory Visit: Payer: Self-pay | Admitting: Obstetrics and Gynecology

## 2016-07-01 DIAGNOSIS — D242 Benign neoplasm of left breast: Secondary | ICD-10-CM

## 2016-07-16 ENCOUNTER — Other Ambulatory Visit: Payer: Self-pay | Admitting: Obstetrics and Gynecology

## 2016-07-16 ENCOUNTER — Ambulatory Visit
Admission: RE | Admit: 2016-07-16 | Discharge: 2016-07-16 | Disposition: A | Discharge status: Home / Self Care | Payer: 59 | Source: Ambulatory Visit | Attending: Obstetrics and Gynecology | Admitting: Obstetrics and Gynecology

## 2016-07-16 DIAGNOSIS — D242 Benign neoplasm of left breast: Secondary | ICD-10-CM

## 2016-07-16 DIAGNOSIS — N63 Unspecified lump in unspecified breast: Secondary | ICD-10-CM

## 2016-07-16 DIAGNOSIS — Z09 Encounter for follow-up examination after completed treatment for conditions other than malignant neoplasm: Secondary | ICD-10-CM

## 2017-01-31 ENCOUNTER — Other Ambulatory Visit: Payer: Self-pay | Admitting: Obstetrics and Gynecology

## 2017-01-31 ENCOUNTER — Other Ambulatory Visit: Payer: 59

## 2017-01-31 ENCOUNTER — Ambulatory Visit
Admission: RE | Admit: 2017-01-31 | Discharge: 2017-01-31 | Disposition: A | Discharge status: Home / Self Care | Payer: 59 | Source: Ambulatory Visit | Attending: Obstetrics and Gynecology | Admitting: Obstetrics and Gynecology

## 2017-01-31 DIAGNOSIS — N63 Unspecified lump in unspecified breast: Secondary | ICD-10-CM

## 2017-05-20 ENCOUNTER — Encounter: Payer: Self-pay | Admitting: Podiatry

## 2017-05-20 ENCOUNTER — Ambulatory Visit: Payer: 59 | Admitting: Podiatry

## 2017-05-20 ENCOUNTER — Ambulatory Visit (INDEPENDENT_AMBULATORY_CARE_PROVIDER_SITE_OTHER): Payer: 59

## 2017-05-20 DIAGNOSIS — M722 Plantar fascial fibromatosis: Secondary | ICD-10-CM

## 2017-05-20 DIAGNOSIS — N644 Mastodynia: Secondary | ICD-10-CM | POA: Insufficient documentation

## 2017-05-20 NOTE — Progress Notes (Signed)
g foot  

## 2017-05-20 NOTE — Progress Notes (Signed)
Subjective:    Patient ID: Amanda [name], female    DOB: [birth year], 57 y.o.   MRN: [id number]  HPI 57 year old female presents the office today for second opinion.  She says she is been having heel pain to both of her heels with the left side worse than the right for several years.  The pain initially started when she was on a ladder at work.  Since then she started to have worsening of the heel pain.  She has had several opinions and had several different treatments for the last couple years.  She has had several injections, braces, inserts and she is also had surgery bilateral heels which did not help.  She is also seen rheumatology and she has seen neurology.  She had an MRI of her back and although there is some pathology in her low back they do not feel this is causing the heel pain.  She also states that when she had a injection in her back that did not help her feet.  She states that she gets pain in the bottom of her heel with weightbearing or pressure.  She denies any numbness or tingling down the heel the pain does not wake up at night.  She has no other concerns today.  No recent injury.   Review of Systems  All other systems reviewed and are negative.  Past Medical History:  Diagnosis Date  . Asthma   . Hyperlipidemia   . Hypertension   . Migraine     Past Surgical History:  Procedure Laterality Date  . APPENDECTOMY    . CARPAL TUNNEL RELEASE     rt  . OOPHORECTOMY       Current Outpatient Medications:  .  albuterol (PROVENTIL HFA;VENTOLIN HFA) 108 (90 Base) MCG/ACT inhaler, Inhale into the lungs., Disp: , Rfl:  .  ALPRAZolam (XANAX) 0.5 MG tablet, alprazolam 0.5 mg tablet  TK 1 T PO BID, Disp: , Rfl:  .  ARIPiprazole (ABILIFY) 2 MG tablet, , Disp: , Rfl:  .  atorvastatin (LIPITOR) 10 MG tablet, , Disp: , Rfl:  .  busPIRone (BUSPAR) 15 MG tablet, buspirone 15 mg tablet  TK 1 T PO BID, Disp: , Rfl:  .  chlorpheniramine (CHLOR-TRIMETON) 4 MG tablet, Take 4 mg by mouth  every 6 (six) hours as needed., Disp: , Rfl:  .  Cholecalciferol 4000 units CAPS, Vitamin D3 4,000 unit capsule  Take by oral route., Disp: , Rfl:  .  citalopram (CELEXA) 10 MG tablet, Take by mouth., Disp: , Rfl:  .  clonazePAM (KLONOPIN) 0.5 MG tablet, 1/2 to 1 tablet twice daily, Disp: , Rfl:  .  hydrochlorothiazide (MICROZIDE) 12.5 MG capsule, hydrochlorothiazide 12.5 mg caps, Disp: , Rfl:  .  montelukast (SINGULAIR) 10 MG tablet, montelukast sodium 10 mg tabs, Disp: , Rfl:  .  traZODone (DESYREL) 50 MG tablet, Take 50 mg by mouth at bedtime., Disp: , Rfl:  .  vitamin B-12 (CYANOCOBALAMIN) 1000 MCG tablet, Take 1,000 mcg by mouth., Disp: , Rfl:   Allergies  Allergen Reactions  . Ace Inhibitors     REACTION: cough  . Amoxicillin     Gi upset  . Codeine Nausea And Vomiting    Can tolerate hydrocodone if has food on stomach Can tolerate hydrocodone if has food on stomach   . Cortisone     Extremely high BP  . Hydrocodone-Acetaminophen     REACTION: vomitting  . Losartan Potassium     REACTION:  cough    Social History   Socioeconomic History  . Marital status: Single    Spouse name: Not on file  . Number of children: 1  . Years of education: Not on file  . Highest education level: Not on file  Occupational History  . Occupation: Aeronautical engineer: Art therapist  Social Needs  . Financial resource strain: Not on file  . Food insecurity:    Worry: Not on file    Inability: Not on file  . Transportation needs:    Medical: Not on file    Non-medical: Not on file  Tobacco Use  . Smoking status: Former Smoker    Packs/day: 0.50    Years: 5.00    Pack years: 2.50    Types: Cigarettes    Last attempt to quit: 01/29/1987    Years since quitting: 30.3  . Smokeless tobacco: Never Used  Substance and Sexual Activity  . Alcohol use: No    Comment: social ETOH only  . Drug use: No  . Sexual activity: Not on file  Lifestyle  . Physical activity:    Days per week:  Not on file    Minutes per session: Not on file  . Stress: Not on file  Relationships  . Social connections:    Talks on phone: Not on file    Gets together: Not on file    Attends religious service: Not on file    Active member of club or organization: Not on file    Attends meetings of clubs or organizations: Not on file    Relationship status: Not on file  . Intimate partner violence:    Fear of current or ex partner: Not on file    Emotionally abused: Not on file    Physically abused: Not on file    Forced sexual activity: Not on file  Other Topics Concern  . Not on file  Social History Narrative  . Not on file        Objective:   Physical Exam General: AAO x3, NAD  Dermatological: Skin is warm, dry and supple bilateral. Nails x 10 are well manicured; remaining integument appears unremarkable at this time. There are no open sores, no preulcerative lesions, no rash or signs of infection present.  Vascular: Dorsalis Pedis artery and Posterior Tibial artery pedal pulses are 2/4 bilateral with immedate capillary fill time. Pedal hair growth present. No varicosities and no lower extremity edema present bilateral. There is no pain with calf compression, swelling, warmth, erythema.   Neruologic: Grossly intact via light touch bilateral. Vibratory intact via tuning fork bilateral. Protective threshold with Semmes Wienstein monofilament intact to all pedal sites bilateral.  Negative Tinel sign.  Musculoskeletal: There is tenderness palpation directly on the plantar aspect of the heel and insertion of the plantar fascia into the calcaneus this is more on the plantar central aspect of the heel.  There is no pain with lateral compression of the calcaneus.  There is no pain on the Achilles tendon the Achilles tendon appears to be intact.  There is no overlying edema, erythema, increase in warmth.  There is no other area pinpoint tenderness or pain to vibratory sensation.  Muscular strength  5/5 in all groups tested bilateral.  Gait: Unassisted, Nonantalgic.      Assessment & Plan:  57 year old female with chronic bilateral heel pain -Treatment options discussed including all alternatives, risks, and complications -X-rays were obtained and reviewed with the patient.  All lateral  views there are sclerotic lines of the posterior superior aspect of the calcaneus but there is no pain with lateral compression.  There is no other areas of identifiable fracture or stress fracture.  There is no calcaneal spurring present. -We discussed with her severe treatment options.  She has exhausted numerous treatments and significant improvement.  She is never had an MRI.  At this point I ordered an MRI of bilateral feet to evaluate the plantar fascia.  We discussed various other treatments including further surgery as well as conservative treatments we will await the results of the MRI before proceeding with more definitive treatment.  Trula Slade DPM

## 2017-05-21 ENCOUNTER — Telehealth: Payer: Self-pay | Admitting: *Deleted

## 2017-05-21 DIAGNOSIS — M79671 Pain in right foot: Secondary | ICD-10-CM

## 2017-05-21 DIAGNOSIS — M79672 Pain in left foot: Secondary | ICD-10-CM

## 2017-05-21 NOTE — Telephone Encounter (Signed)
-----   Message from Trula Slade, DPM sent at 05/21/2017 11:13 AM EDT ----- Can you please order bilateral MRIs to rule out plantar fascial tear?  She has had pain for several years and several conservative as well as surgical interventions without any improvement.  Thank you

## 2017-05-21 NOTE — Telephone Encounter (Signed)
Orders to J. Quintana, RN for pre-cert. 

## 2017-05-28 ENCOUNTER — Telehealth: Payer: Self-pay | Admitting: *Deleted

## 2017-05-28 NOTE — Telephone Encounter (Signed)
Left message informing pt the MRIs had been approved and she could call [facility] [phone] to schedule.

## 2017-06-06 ENCOUNTER — Ambulatory Visit
Admission: RE | Admit: 2017-06-06 | Discharge: 2017-06-06 | Disposition: A | Discharge status: Home / Self Care | Payer: 59 | Source: Ambulatory Visit | Attending: Podiatry | Admitting: Podiatry

## 2017-06-06 ENCOUNTER — Telehealth: Payer: Self-pay | Admitting: Podiatry

## 2017-06-06 DIAGNOSIS — M79671 Pain in right foot: Secondary | ICD-10-CM

## 2017-06-06 NOTE — Telephone Encounter (Signed)
Called Amanda [name] to discuss MRI findings. At this time we are going to start with EPAT. She does not have pain to the outside of her foot and is to the heel. We will get this started ASAP for her at her convience.   Trula Slade

## 2017-06-17 ENCOUNTER — Ambulatory Visit (INDEPENDENT_AMBULATORY_CARE_PROVIDER_SITE_OTHER): Payer: 59

## 2017-06-17 DIAGNOSIS — M722 Plantar fascial fibromatosis: Secondary | ICD-10-CM

## 2017-06-17 DIAGNOSIS — M79676 Pain in unspecified toe(s): Secondary | ICD-10-CM

## 2017-06-24 ENCOUNTER — Ambulatory Visit (INDEPENDENT_AMBULATORY_CARE_PROVIDER_SITE_OTHER): Payer: 59

## 2017-06-24 DIAGNOSIS — M722 Plantar fascial fibromatosis: Secondary | ICD-10-CM

## 2017-06-24 DIAGNOSIS — M79676 Pain in unspecified toe(s): Secondary | ICD-10-CM

## 2017-07-01 ENCOUNTER — Ambulatory Visit (INDEPENDENT_AMBULATORY_CARE_PROVIDER_SITE_OTHER): Payer: 59

## 2017-07-01 DIAGNOSIS — M722 Plantar fascial fibromatosis: Secondary | ICD-10-CM

## 2017-07-01 DIAGNOSIS — M79676 Pain in unspecified toe(s): Secondary | ICD-10-CM

## 2017-07-01 NOTE — Progress Notes (Signed)
Patient presents with bilateral pain in both heels left worse than right. She has recently been diagnosed with plantar fasciitis in both feet and has had previous surgery years ago to help alleviate this pain. Currently states she has had this issue for 5 years now and has never had long term relief of pain  Pain on palpation to bilateral heels, medial to mid heel, left over right  ESWT administered to left heel for 5 joules. Massage instrument applied post treatment. She tolerated procedure well. We will consider treating her right foot next visit if she tolerates the procedure without significant flare up. Boot was dispensed with instructions on use. Advised to avoid NSAIDs and ice.

## 2017-07-01 NOTE — Progress Notes (Signed)
Patient presents with bilateral pain in both heels left worse than right. She has recently been diagnosed with plantar fasciitis in both feet and has had previous surgery years ago to help alleviate this pain. Currently states she has had this issue for 5 years now and has never had long term relief of pain. She said that after her treatment her left foot flared up and she has some tenderness in her achilles tendon on both feet  Pain on palpation to bilateral heels, medial to mid heel, left over right  ESWT administered to left heel for 2 joules. Massage instrument applied post treatment. She tolerated procedure well. We will consider treating her right foot next visit if she tolerates the procedure without significant flare up. Boot was dispensed with instructions on use. Advised to avoid NSAIDs and ice.

## 2017-07-06 NOTE — Progress Notes (Signed)
Chief Complaint  Patient presents with  . New Patient (Initial Visit)    palpitations    History of Present Illness: 57 yo female with history of HLD, HTN here today as a new patient for the evaluation of her hyperlipidemia and palpitations. She has been tried on Lipitor or Crestor and developed a rash on her face and legs. Palpitations at night. She feels her heart racing when in bed. No chest pain or dyspnea, LE edema or syncope. Also reports fatigue.   Primary Care Physician: Antony Contras, MD  Past Medical History:  Diagnosis Date  . Asthma   . Chronic kidney disease   . Hyperlipidemia   . Hypertension   . Migraine     Past Surgical History:  Procedure Laterality Date  . APPENDECTOMY    . CARPAL TUNNEL RELEASE     rt  . FOOT SURGERY Bilateral    Plantar fasciitis  . OOPHORECTOMY      Current Outpatient Medications  Medication Sig Dispense Refill  . Cholecalciferol (VITAMIN D PO) Take 1 tablet by mouth daily.    . hydrochlorothiazide (MICROZIDE) 12.5 MG capsule hydrochlorothiazide 12.5 mg caps    . metoprolol tartrate (LOPRESSOR) 25 MG tablet Take 1 tablet by mouth 2 (two) times daily.    . vitamin B-12 (CYANOCOBALAMIN) 1000 MCG tablet Take 1,000 mcg by mouth.     No current facility-administered medications for this visit.     Allergies  Allergen Reactions  . Ace Inhibitors     REACTION: cough  . Amoxicillin     Gi upset  . Codeine Nausea And Vomiting    Can tolerate hydrocodone if has food on stomach Can tolerate hydrocodone if has food on stomach   . Cortisone     Extremely high BP  . Hydrocodone-Acetaminophen     REACTION: vomitting  . Losartan Potassium     REACTION: cough    Social History   Socioeconomic History  . Marital status: Married    Spouse name: Not on file  . Number of children: 1  . Years of education: Not on file  . Highest education level: Not on file  Occupational History  . Occupation: Aeronautical engineer: Management consultant  Social Needs  . Financial resource strain: Not on file  . Food insecurity:    Worry: Not on file    Inability: Not on file  . Transportation needs:    Medical: Not on file    Non-medical: Not on file  Tobacco Use  . Smoking status: Former Smoker    Packs/day: 0.50    Years: 5.00    Pack years: 2.50    Types: Cigarettes    Last attempt to quit: 01/29/1987    Years since quitting: 30.4  . Smokeless tobacco: Never Used  Substance and Sexual Activity  . Alcohol use: Yes    Comment: social ETOH only  . Drug use: No  . Sexual activity: Not on file  Lifestyle  . Physical activity:    Days per week: Not on file    Minutes per session: Not on file  . Stress: Not on file  Relationships  . Social connections:    Talks on phone: Not on file    Gets together: Not on file    Attends religious service: Not on file    Active member of club or organization: Not on file    Attends meetings of clubs or organizations: Not on file  Relationship status: Not on file  . Intimate partner violence:    Fear of current or ex partner: Not on file    Emotionally abused: Not on file    Physically abused: Not on file    Forced sexual activity: Not on file  Other Topics Concern  . Not on file  Social History Narrative  . Not on file    Family History  Problem Relation Age of Onset  . Asthma Father   . Heart disease Father        CAD s/p CABG  . Asthma Sister   . Asthma Paternal Aunt     Review of Systems:  As stated in the HPI and otherwise negative.   BP 126/80   Pulse 65   Ht 5\' 2"  (1.575 m)   Wt 147 lb 12.8 oz (67 kg)   LMP 03/23/2011   SpO2 99%   BMI 27.03 kg/m   Physical Examination: General: Well developed, well nourished, NAD  HEENT: OP clear, mucus membranes moist  SKIN: warm, dry. No rashes. Neuro: No focal deficits  Musculoskeletal: Muscle strength 5/5 all ext  Psychiatric: Mood and affect normal  Neck: No JVD, no carotid bruits, no thyromegaly, no  lymphadenopathy.  Lungs:Clear bilaterally, no wheezes, rhonci, crackles Cardiovascular: Regular rate and rhythm. No murmurs, gallops or rubs. Abdomen:Soft. Bowel sounds present. Non-tender.  Extremities: No lower extremity edema. Pulses are 2 + in the bilateral DP/PT.  EKG:  EKG is ordered today. The ekg ordered today demonstrates Sinus brady, rate 59 bpm.   Recent Labs: No results found for requested labs within last 8760 hours.   Lipid Panel    Component Value Date/Time   CHOL 142 05/25/2008 1036   TRIG 69.0 05/25/2008 1036   HDL 31.70 (L) 05/25/2008 1036   CHOLHDL 4 05/25/2008 1036   VLDL 13.8 05/25/2008 1036   LDLCALC 97 05/25/2008 1036   LDLDIRECT 121.7 07/02/2007 0906     Wt Readings from Last 3 Encounters:  07/07/17 147 lb 12.8 oz (67 kg)  08/10/14 149 lb (67.6 kg)  07/26/14 150 lb (68 kg)     Other studies Reviewed: Additional studies/ records that were reviewed today include: . Review of the above records demonstrates:    Assessment and Plan:   1. Palpitations: She is feeling palpitations at night. I will arrange an echo to assess LV function and a 48 hour monitor. She reports normal thyroid levels in past.   2. Fatigue: Will arrange echo to assess LV function  3. Hyperlipidemia: Statin intolerant. I don't have most recent labs from Dr. Moreen Fowler. Will repeat lipids here this am. Refer to lipid clinic to consider Praluent or Repatha.   Current medicines are reviewed at length with the patient today.  The patient does not have concerns regarding medicines.  The following changes have been made:  no change  Labs/ tests ordered today include:   Orders Placed This Encounter  Procedures  . Lipid panel  . AMB Referral to [facility]  . HOLTER MONITOR - 48 HOUR  . EKG 12-Lead  . ECHOCARDIOGRAM COMPLETE     Disposition:   FU with me in 6 months   Signed, Lauree Chandler, MD 07/07/2017 8:44 AM    [facility] [place], [place], [place]  [address] Phone: [phone]; Fax: [phone]

## 2017-07-07 ENCOUNTER — Ambulatory Visit: Payer: 59 | Admitting: Cardiovascular Disease

## 2017-07-07 ENCOUNTER — Encounter: Payer: Self-pay | Admitting: Cardiovascular Disease

## 2017-07-07 VITALS — BP 126/80 | HR 65 | Ht 62.0 in | Wt 147.8 lb

## 2017-07-07 DIAGNOSIS — E7800 Pure hypercholesterolemia, unspecified: Secondary | ICD-10-CM | POA: Diagnosis not present

## 2017-07-07 DIAGNOSIS — R5383 Other fatigue: Secondary | ICD-10-CM | POA: Diagnosis not present

## 2017-07-07 DIAGNOSIS — R002 Palpitations: Secondary | ICD-10-CM

## 2017-07-07 LAB — LIPID PANEL
Chol/HDL Ratio: 4.2 ratio (ref 0.0–4.4)
Cholesterol, Total: 201 mg/dL — ABNORMAL HIGH (ref 100–199)
HDL: 48 mg/dL (ref >39)
LDL Calculated: 116 mg/dL — ABNORMAL HIGH (ref 0–99)
Triglycerides: 184 mg/dL — ABNORMAL HIGH (ref 0–149)
VLDL Cholesterol Cal: 37 mg/dL (ref 5–40)

## 2017-07-07 NOTE — Patient Instructions (Signed)
Medication Instructions:  Your physician recommends that you continue on your current medications as directed. Please refer to the Current Medication list given to you today.   Labwork: Your physician recommends that you return for lab work in: today (lipids)   Testing/Procedures: Your physician has recommended that you wear a holter monitor. Holter monitors are medical devices that record the heart's electrical activity. Doctors most often use these monitors to diagnose arrhythmias. Arrhythmias are problems with the speed or rhythm of the heartbeat. The monitor is a small, portable device. You can wear one while you do your normal daily activities. This is usually used to diagnose what is causing palpitations/syncope (passing out).  Your physician has requested that you have an echocardiogram. Echocardiography is a painless test that uses sound waves to create images of your heart. It provides your doctor with information about the size and shape of your heart and how well your heart's chambers and valves are working. This procedure takes approximately one hour. There are no restrictions for this procedure.    Follow-Up: Your physician wants you to follow-up in: 6 months with Dr. Angelena Form. You will receive a reminder letter in the mail two months in advance. If you don't receive a letter, please call our office to schedule the follow-up appointment.   Any Other Special Instructions Will Be Listed Below (If Applicable).  You have been referred to [place]    If you need a refill on your cardiac medications before your next appointment, please call your pharmacy.

## 2017-07-09 ENCOUNTER — Telehealth: Payer: Self-pay

## 2017-07-09 NOTE — Telephone Encounter (Signed)
SENT REFERRAL TO SCHEDULING, FILED NOTES 

## 2017-07-11 NOTE — Progress Notes (Signed)
Patient presents with bilateral pain in both heels left worse than right. She has recently been diagnosed with plantar fasciitis in both feet and has had previous surgery years ago to help alleviate this pain.She states that her pain improves shortly after treatments but when increasing her activities her pain returns. Pain on palpation to bilateral heels, medial to mid heel, left over right  ESWT administered to left heel for  4.5joules. Massage instrument applied post treatment. She tolerated procedure well. Advised to avoid NSAIDs and ice.

## 2017-07-15 ENCOUNTER — Ambulatory Visit: Payer: Self-pay

## 2017-07-15 DIAGNOSIS — M722 Plantar fascial fibromatosis: Secondary | ICD-10-CM

## 2017-07-15 DIAGNOSIS — M79676 Pain in unspecified toe(s): Secondary | ICD-10-CM

## 2017-07-18 NOTE — Progress Notes (Signed)
Patient presents with bilateral pain in both heels left worse than right. She has recently been diagnosed with plantar fasciitis in both feet and has had previous surgery years ago to help alleviate this pain.She states that she has noticed a lot of improvement in her pain since starting treatment   Pain on palpation to bilateral heels, medial to mid heel, left over right  ESWT administered to left heel for  5 joules. Massage instrument applied post treatment. She tolerated procedure well. Advised to avoid NSAIDs and ice.

## 2017-07-28 ENCOUNTER — Ambulatory Visit: Payer: 59 | Admitting: Pharmacist

## 2017-07-28 DIAGNOSIS — E782 Mixed hyperlipidemia: Secondary | ICD-10-CM

## 2017-07-28 DIAGNOSIS — E785 Hyperlipidemia, unspecified: Secondary | ICD-10-CM | POA: Insufficient documentation

## 2017-07-28 MED ORDER — EZETIMIBE 10 MG PO TABS: 10.0000 mg | ORAL_TABLET | Freq: Every day | ORAL | 11 refills | Status: DC

## 2017-07-28 MED ORDER — OMEGA-3-ACID ETHYL ESTERS 1 G PO CAPS: 2.0000 g | ORAL_CAPSULE | Freq: Every day | ORAL | 11 refills | Status: DC

## 2017-07-28 NOTE — Progress Notes (Signed)
Patient ID: Amanda [name]                 DOB: 1960-07-21                    MRN: [id number]     HPI: Amanda [name] is a 57 y.o. female patient referred to lipid clinic by Dr Amanda [name]. PMH is significant for HTN, HLD, palpitations, and asthma. 10 year ASCVD risk is 3.6%. She has a history of statin intolerance and presents to lipid clinic for further management.   Pt presents today in good spirits. She has tried rosuvastatin and atorvastatin in the past. She developed a rash on her face and the back of her legs within 1 week of statin initiation in each case. The rash itched and burned a bit. Cortisone did not alleviate her symptoms. In each case, her rash disappeared within 3 days of statin discontinuation. She does not have any dye allergies and has not had this reaction with other types of prescription medications before.  She has a family history of CAD. Her father had triple bypass surgery at the age of 68 and passed away from an MI at age 4. Her sisters have been taking cholesterol medications for years.   Current Medications: none Intolerances: rosuvastatin, atorvastatin 10mg  daily - rash on face and legs Risk Factors: family history LDL goal: 100mg /dL  Diet: Has tried to cut back on fried foods. Does not eat breakfast. Likes sandwiches, grilled chicken. Snacks on chips and nuts. Eating more meals at home. Drinks 2 regular sodas each day.   Exercise: Joined a gym recently, has gone a few times. Has plantar fasciitis which limits walking.  Family History: Father with CAD s/p triple CABG at age 72, died from MI at 38  Social History: Former smoker 1/2 PPD for 5 years, quit in 1989. Drinks alcohol socially, does not use illicit drugs.  Labs: 07/07/17: TC 201, TG 184, HDL 48, LDL 116 (no lipid lowering therapy)  Past Medical History:  Diagnosis Date  . Asthma   . Chronic kidney disease   . Hyperlipidemia   . Hypertension   . Migraine     Current Outpatient Medications on  File Prior to Visit  Medication Sig Dispense Refill  . Cholecalciferol (VITAMIN D PO) Take 1 tablet by mouth daily.    . hydrochlorothiazide (MICROZIDE) 12.5 MG capsule hydrochlorothiazide 12.5 mg caps    . metoprolol tartrate (LOPRESSOR) 25 MG tablet Take 1 tablet by mouth 2 (two) times daily.    . vitamin B-12 (CYANOCOBALAMIN) 1000 MCG tablet Take 1,000 mcg by mouth.     No current facility-administered medications on file prior to visit.     Allergies  Allergen Reactions  . Ace Inhibitors     REACTION: cough  . Amoxicillin     Gi upset  . Codeine Nausea And Vomiting    Can tolerate hydrocodone if has food on stomach Can tolerate hydrocodone if has food on stomach   . Cortisone     Extremely high BP  . Hydrocodone-Acetaminophen     REACTION: vomitting  . Losartan Potassium     REACTION: cough    Assessment/Plan:  1. Hyperlipidemia - Baseline LDL 116 above goal 100mg /dL due to family history of premature CAD. She has experienced a rash with both atorvastatin and rosuvastatin. Will start ezetimibe 10mg  daily. Pt has also been taking OTC fish oil. Will switch this to prescription Lovaza 2g daily which will help to  lower mildly elevated TG. Also encouraged pt to cut back on regular soda (currently 2 cans a day) to help control TG. Will recheck lipids and LFTs in 3 months.    Amanda [name] E. Amanda [name], PharmD, BCACP, [place] [address] [place], [place] [address] Phone: [phone]; Fax: [phone] 07/28/2017 3:28 PM

## 2017-07-28 NOTE — Patient Instructions (Addendum)
It was nice to see you today  Start taking ezetimibe (Zetia) 10mg  once a day to help lower your LDL cholesterol  You can take 2 fish oil capsules each day to help lower your triglycerides. Decreasing your intake of regular soda will help to lower your triglyerides as well.  Try to stay active at the gym with activities that will not make your foot pain worse  Recheck cholesterol in 3 months on Tuesday, October 1st. Come in any time after 7:30am for fasting lab work  [employer], Pharmacist if you have problems tolerating these medications [phone]

## 2017-08-05 ENCOUNTER — Ambulatory Visit: Payer: 59

## 2017-08-05 DIAGNOSIS — M722 Plantar fascial fibromatosis: Secondary | ICD-10-CM

## 2017-08-07 ENCOUNTER — Telehealth: Payer: Self-pay | Admitting: *Deleted

## 2017-08-07 NOTE — Telephone Encounter (Signed)
Prior authorization done for OMEGA 3 ACID and faxed to [facility]. I included trials, dx, labs and allergies, notes from Dr Angelena Form and Fuller Canada, PharmD.

## 2017-08-08 NOTE — Progress Notes (Signed)
Patient presents with bilateral pain in both heels left worse than right. She has recently been diagnosed with plantar fasciitis in both feet and has had previous surgery years ago to help alleviate this pain.She states her left heel still hurts at times but overall has improved a lot. Her right heel does not hurt anymore    Pain on palpation to bilateral heels, medial to mid heel, left over right  ESWT administered to left heel for 5 joules. Massage instrument applied post treatment. She tolerated procedure well. Advised to avoid NSAIDs and ice. Follow up in 2 weeks for 6th treatment

## 2017-08-12 NOTE — Telephone Encounter (Addendum)
Not sure why insurance would require a prior authorization for generic Lovaza in the first place. Have only seen this denial reason for Vascepa, not Lovaza. Pt will need to continue with less pure OTC fish oil due to insurance restriction. Pt is aware of plan.

## 2017-08-12 NOTE — Telephone Encounter (Signed)
FYI

## 2017-08-12 NOTE — Telephone Encounter (Signed)
**Note De-Identified [name]** Letter received Amanda [name] fax from [employer] stating that they have denied coverage of OMEGA 3 ACID. Reason: The pts must have a DX of severe hypertriglyceridemia (pre-treatment triglyceride level greater than or equal to 500 mg/dL)  The pts triglyceride level on 07/07/17 was 184.  Will forward this message to Dr Angelena Form and his nurse for advisement to the pt.

## 2017-08-13 NOTE — Telephone Encounter (Signed)
Thanks!    Chris.

## 2017-08-19 ENCOUNTER — Ambulatory Visit: Payer: Self-pay

## 2017-08-19 DIAGNOSIS — M79676 Pain in unspecified toe(s): Secondary | ICD-10-CM

## 2017-08-19 DIAGNOSIS — M722 Plantar fascial fibromatosis: Secondary | ICD-10-CM

## 2017-08-26 NOTE — Progress Notes (Signed)
Patient presents with bilateral pain in both heels left worse than right. She has recently been diagnosed with plantar fasciitis in both feet and has had previous surgery years ago to help alleviate this pain.  She states that her left heel is still painful but she is having more good days than bad days.  Her right heel is doing well with no pain.  She states this is the first time in a while that she has not had any pain.   Pain on palpation to bilateral heels, medial to mid heel, left over right  ESWT administered to left heel for 7 joules. Massage instrument applied post treatment. She tolerated procedure well. Advised to avoid NSAIDs and ice.  Follow-up with Dr. Jacqualyn Posey in 4 weeks.

## 2017-09-01 ENCOUNTER — Ambulatory Visit
Admission: RE | Admit: 2017-09-01 | Discharge: 2017-09-01 | Disposition: A | Discharge status: Home / Self Care | Payer: 59 | Source: Ambulatory Visit | Attending: Obstetrics and Gynecology | Admitting: Obstetrics and Gynecology

## 2017-09-01 DIAGNOSIS — N63 Unspecified lump in unspecified breast: Secondary | ICD-10-CM

## 2017-09-10 ENCOUNTER — Other Ambulatory Visit: Payer: Self-pay | Admitting: Obstetrics and Gynecology

## 2017-09-10 DIAGNOSIS — Z1231 Encounter for screening mammogram for malignant neoplasm of breast: Secondary | ICD-10-CM

## 2017-09-11 ENCOUNTER — Telehealth: Payer: Self-pay | Admitting: Pharmacist

## 2017-09-11 NOTE — Telephone Encounter (Signed)
Pt called clinic with reports of GI issues and diarrhea since starting Zetia last month. She stopped taking her medication for a few days and her GI issues resolved. They reappeared when she resumed Zetia. She has tried taking the medication with food but this did not help. She has experienced a rash with 2 statins. Advised pt to decrease her Zetia to 5mg  daily as this typically has similar efficacy with the 10mg  dose but fewer GI symptoms. She will call with tolerability updates.

## 2017-09-16 ENCOUNTER — Other Ambulatory Visit: Payer: Self-pay

## 2017-09-16 ENCOUNTER — Ambulatory Visit (INDEPENDENT_AMBULATORY_CARE_PROVIDER_SITE_OTHER): Payer: 59 | Admitting: Podiatry

## 2017-09-16 DIAGNOSIS — M722 Plantar fascial fibromatosis: Secondary | ICD-10-CM

## 2017-09-18 DIAGNOSIS — M722 Plantar fascial fibromatosis: Secondary | ICD-10-CM | POA: Insufficient documentation

## 2017-09-18 NOTE — Progress Notes (Signed)
Subjective: 57 year old female presents the office today for evaluation of bilateral heel pain, plantar fasciitis.  She has been undergoing shockwave therapy and she said that she is about 50 to 60% better.  She states that her quality of life is getting better and she is able to do shopping more walking with minimal pain but she still gets some discomfort mostly to be in her feet all day.  She is attempted numerous orthotics without any help from those may actually caused her feet to hurt more.  No numbness or tingling.  The pain does not wake her up at night.  No other concerns. Denies any systemic complaints such as fevers, chills, nausea, vomiting. No acute changes since last appointment, and no other complaints at this time.   Objective: AAO x3, NAD DP/PT pulses palpable bilaterally, CRT less than 3 seconds At this time there is no significant tenderness palpation on the course or insertion of plantar fashion.  The Achilles tendon appears to be intact.  There is no pain with lateral compression of calcaneus.  Negative Tinel sign.  No edema, erythema, increased warmth. No open lesions or pre-ulcerative lesions.  No pain with calf compression, swelling, warmth, erythema  Assessment: Chronic plantar fasciitis, heel pain bilaterally  Plan: -All treatment options discussed with the patient including all alternatives, risks, complications.  -At this point she is doing better but she still having some discomfort.  We will continue with stretching exercise as well as wearing supportive shoes.  After discussion she is to do 1-2 more treatments of EPAT.  We will get a reappointed for this. -Patient encouraged to call the office with any questions, concerns, change in symptoms.   Trula Slade DPM

## 2017-10-07 ENCOUNTER — Other Ambulatory Visit: Payer: 59

## 2017-10-28 ENCOUNTER — Other Ambulatory Visit: Payer: 59

## 2017-10-28 DIAGNOSIS — E782 Mixed hyperlipidemia: Secondary | ICD-10-CM

## 2017-10-28 LAB — LIPID PANEL
Chol/HDL Ratio: 4.4 ratio (ref 0.0–4.4)
Cholesterol, Total: 194 mg/dL (ref 100–199)
HDL: 44 mg/dL (ref >39)
LDL Calculated: 113 mg/dL — ABNORMAL HIGH (ref 0–99)
Triglycerides: 183 mg/dL — ABNORMAL HIGH (ref 0–149)
VLDL Cholesterol Cal: 37 mg/dL (ref 5–40)

## 2017-10-28 LAB — HEPATIC FUNCTION PANEL
ALT: 19 IU/L (ref 0–32)
AST: 18 IU/L (ref 0–40)
Albumin: 4.2 g/dL (ref 3.5–5.5)
Alkaline Phosphatase: 98 IU/L (ref 39–117)
Bilirubin Total: 0.3 mg/dL (ref 0.0–1.2)
Bilirubin, Direct: 0.09 mg/dL (ref 0.00–0.40)
Total Protein: 6.7 g/dL (ref 6.0–8.5)

## 2017-10-30 ENCOUNTER — Telehealth: Payer: Self-pay | Admitting: Pharmacist

## 2017-10-30 NOTE — Telephone Encounter (Signed)
LMOM for pt - she was started on Zetia in July and reduced her dose to 5mg  daily due to GI intolerance with the 10mg  dose. Lipids remain unchanged so pt has likely discontinued therapy. Will await patient's return call to clarify current lipid lowering regimen.

## 2017-11-04 ENCOUNTER — Ambulatory Visit (INDEPENDENT_AMBULATORY_CARE_PROVIDER_SITE_OTHER): Payer: 59

## 2017-11-04 DIAGNOSIS — R002 Palpitations: Secondary | ICD-10-CM | POA: Diagnosis not present

## 2017-11-04 DIAGNOSIS — R5383 Other fatigue: Secondary | ICD-10-CM

## 2017-11-05 ENCOUNTER — Telehealth: Payer: Self-pay | Admitting: Pharmacist

## 2017-11-05 MED ORDER — PRAVASTATIN SODIUM 20 MG PO TABS: 20.0000 mg | ORAL_TABLET | Freq: Every evening | ORAL | 11 refills | Status: DC

## 2017-11-05 NOTE — Telephone Encounter (Signed)
Spoke with pt - she states she has still been taking Zetia 5mg  daily but experiencing diarrhea. Lipids are unchanged on Zetia, will stop therapy due to side effects and lack of benefit. Pt has not experienced a rash with any other fillers in medications or dyes, so her allergy may be due to the chemical structure of statins. Pravastatin is structurally different from Lipitor and Crestor and does not contain a fluoride or sulfa moiety. She is willing to try low dose pravastatin to see if she tolerates therapy better. Will start pravastatin 20mg  every evening and advised pt to contact clinic if she notices a rash again (typically presents in 1 - 2 weeks for her). If she does develop a rash, it may be work checking a coronary calcium score to further risk stratify pt as her main risk factor is family history of premature CAD and she would not currently qualify for PCSK9i therapy.

## 2017-11-19 ENCOUNTER — Ambulatory Visit: Payer: 59 | Admitting: Cardiovascular Disease

## 2017-11-20 ENCOUNTER — Ambulatory Visit: Payer: 59 | Admitting: Psychiatry

## 2017-11-20 DIAGNOSIS — F338 Other recurrent depressive disorders: Secondary | ICD-10-CM | POA: Diagnosis not present

## 2017-11-20 DIAGNOSIS — F411 Generalized anxiety disorder: Secondary | ICD-10-CM

## 2017-11-20 DIAGNOSIS — G4700 Insomnia, unspecified: Secondary | ICD-10-CM | POA: Diagnosis not present

## 2017-11-20 MED ORDER — CLONAZEPAM 1 MG PO TABS: ORAL_TABLET | ORAL | 1 refills | Status: DC

## 2017-11-20 MED ORDER — DOXEPIN HCL 10 MG PO CAPS: 10.0000 mg | ORAL_CAPSULE | Freq: Every day | ORAL | 1 refills | Status: DC

## 2017-11-20 NOTE — Progress Notes (Signed)
[facility]  Patient ID: Amanda [name],  MRN: [id number]  PCP: Antony Contras, MD  Date of Evaluation: 11/20/2017 Time spent:20 minutes   HISTORY/CURRENT STATUS: HPI patient is a 57 year old white female last seen 10/20/2017.  She carries a diagnosis of anxiety, seasonal depression, insomnia.  At the time her main concern was her insomnia.  Depression and anxiety were about the same she was prescribed Klonopin 1 mg at bedtime, also to try doxepin 10 mg capsules 1 before bed. She was also referred to [facility] neurologic for sleep evaluation. She used the doxepin for several days without benefit. Using the Klonopin 1mg  hs with very little benefit.  Individual Medical History/ Review of Systems: Changes? :No  Allergies: Ace inhibitors; Amoxicillin; Codeine; Cortisone; Hydrocodone-acetaminophen; Losartan potassium; Statins; and Zetia [ezetimibe]  Current Medications:  Current Outpatient Medications:  .  Cholecalciferol (VITAMIN D PO), Take 1 tablet by mouth daily., Disp: , Rfl:  .  clonazePAM (KLONOPIN) 1 MG tablet, Take 1 mg by mouth at bedtime., Disp: , Rfl:  .  hydrochlorothiazide (MICROZIDE) 12.5 MG capsule, hydrochlorothiazide 12.5 mg caps, Disp: , Rfl:  .  metoprolol tartrate (LOPRESSOR) 25 MG tablet, Take 1 tablet by mouth 2 (two) times daily., Disp: , Rfl:  .  pravastatin (PRAVACHOL) 20 MG tablet, Take 1 tablet (20 mg total) by mouth every evening., Disp: 30 tablet, Rfl: 11 .  rizatriptan (MAXALT) 10 MG tablet, rizatriptan 10 mg tablet, Disp: , Rfl:  .  vitamin B-12 (CYANOCOBALAMIN) 1000 MCG tablet, Take 1,000 mcg by mouth., Disp: , Rfl:  .  Vitamin D, Ergocalciferol, (DRISDOL) 50000 units CAPS capsule, TK 1 C PO 1 TIME A WK, Disp: , Rfl: 1 .  ALPRAZolam (XANAX) 0.5 MG tablet, alprazolam 0.5 mg tablet  TK 1 T PO BID, Disp: , Rfl:  .  busPIRone (BUSPAR) 15 MG tablet, buspirone 15 mg tablet  TK 1 T PO BID, Disp: , Rfl:  .  cyclobenzaprine (FLEXERIL) 10 MG tablet,  cyclobenzaprine 10 mg tablet, Disp: , Rfl:  .  doxepin (SINEQUAN) 10 MG capsule, Take 10 mg by mouth at bedtime., Disp: , Rfl:  .  DULoxetine (CYMBALTA) 30 MG capsule, Take 30 mg by mouth daily., Disp: , Rfl:  .  Eszopiclone 3 MG TABS, eszopiclone 3 mg tablet, Disp: , Rfl:  .  fluconazole (DIFLUCAN) 150 MG tablet, fluconazole 150 mg tablet  TK 1 T PO Q 72 H FOR 3 DAYS, Disp: , Rfl:  .  FLUoxetine (PROZAC) 10 MG tablet, Take 10 mg by mouth daily. tk 3 (30 mg), Disp: , Rfl:  .  fluticasone (FLONASE) 50 MCG/ACT nasal spray, fluticasone propionate 50 mcg/actuation nasal spray,suspension  TAKE 1-2 SPRAY IN BOTH NOSTRILS GIVEN ONCE A DAY., Disp: , Rfl:  .  gabapentin (NEURONTIN) 300 MG capsule, Take 300 mg by mouth at bedtime. Take 2 (600 mg), Disp: , Rfl:  .  ibuprofen (ADVIL,MOTRIN) 800 MG tablet, ibuprofen 800 mg tablet, Disp: , Rfl:  .  Omega-3 Fatty Acids (FISH OIL) 1000 MG CAPS, Take 2,000 mg by mouth daily., Disp: , Rfl:  .  oxycodone (OXY-IR) 5 MG capsule, oxycodone 5 mg tablet  TK 1 T PO Q 6 TO 8 H PRN P, Disp: , Rfl:  .  predniSONE (DELTASONE) 20 MG tablet, prednisone 20 mg tablet, Disp: , Rfl:  .  sulfamethoxazole-trimethoprim (BACTRIM DS,SEPTRA DS) 800-160 MG tablet, sulfamethoxazole 800 mg-trimethoprim 160 mg tablet, Disp: , Rfl:  .  tizanidine (ZANAFLEX) 2 MG capsule, tizanidine 2  mg capsule, Disp: , Rfl:  .  zolpidem (AMBIEN) 10 MG tablet, Take 10 mg by mouth at bedtime., Disp: , Rfl:  Medication Side Effects: None  Family Medical/ Social History: Changes? No  MENTAL HEALTH EXAM:  Last menstrual period 03/23/2011.There is no height or weight on file to calculate BMI.  General Appearance: Casual  Eye Contact:  Good  Speech:  Normal Rate  Volume:  Normal  Mood:  Euthymic  Affect:  Appropriate  Thought Process:  Linear  Orientation:  Full (Time, Place, and Person)  Thought Content: Logical   Suicidal Thoughts:  No  Homicidal Thoughts:  No  Memory:  normal  Judgement:  Good   Insight:  Good  Psychomotor Activity:  Normal  Concentration:  Concentration: Good  Recall:  Good  Fund of Knowledge: Good  Language: Good  Akathisia:  NA  AIMS (if indicated): na  Assets:  Desire for Improvement  ADL's:  Intact  Cognition: WNL  Prognosis:  Good    DIAGNOSES:    ICD-10-CM   1. Anxiety state F41.1   2. Seasonal depression ([place]) F33.8   3. Insomnia, unspecified type G47.00     RECOMMENDATIONS: Patient will try to take the doxepin 10 mg in the Klonopin 1 mg both at bedtime he has appointment next month with [facility] neurologic for sleep consult.  Patient to return in 6 weeks.    Comer Locket, PA-C

## 2017-12-02 ENCOUNTER — Telehealth: Payer: Self-pay | Admitting: Pharmacist

## 2017-12-02 NOTE — Telephone Encounter (Signed)
Called pt to follow up with pravastatin tolerability. Reports a more mild rash than what she experienced with rosuvastatin and atorvastatin. She has had 2 small breakouts on her face that have resolved with cortisone cream. She has also noticed some joint pain and fatigue   Will try decreasing frequency to pravastatin 20mg  every other day. Advised pt that if any of these symptoms persisted in a few weeks, that she can start cutting her tablets in half and take 10mg  every other day. She sees Dr Angelena Form for follow up in 3 months and can check lipids that day if she continues on pravastatin. Advised her to call clinic if she stops therapy.

## 2017-12-11 ENCOUNTER — Encounter: Payer: Self-pay | Admitting: Neurology

## 2017-12-16 ENCOUNTER — Institutional Professional Consult (permissible substitution): Payer: 59 | Admitting: Neurology

## 2017-12-29 DIAGNOSIS — H66002 Acute suppurative otitis media without spontaneous rupture of ear drum, left ear: Secondary | ICD-10-CM | POA: Diagnosis not present

## 2017-12-29 DIAGNOSIS — J069 Acute upper respiratory infection, unspecified: Secondary | ICD-10-CM | POA: Diagnosis not present

## 2018-01-01 ENCOUNTER — Ambulatory Visit: Payer: 59 | Admitting: Psychiatry

## 2018-02-11 ENCOUNTER — Ambulatory Visit: Payer: BLUE CROSS/BLUE SHIELD | Admitting: Neurology

## 2018-02-11 ENCOUNTER — Encounter: Payer: Self-pay | Admitting: Neurology

## 2018-02-11 VITALS — BP 121/82 | HR 61 | Ht 62.0 in | Wt 152.0 lb

## 2018-02-11 DIAGNOSIS — R3589 Other polyuria: Secondary | ICD-10-CM

## 2018-02-11 DIAGNOSIS — F5104 Psychophysiologic insomnia: Secondary | ICD-10-CM

## 2018-02-11 DIAGNOSIS — F419 Anxiety disorder, unspecified: Secondary | ICD-10-CM | POA: Insufficient documentation

## 2018-02-11 DIAGNOSIS — G4721 Circadian rhythm sleep disorder, delayed sleep phase type: Secondary | ICD-10-CM | POA: Diagnosis not present

## 2018-02-11 DIAGNOSIS — R358 Other polyuria: Secondary | ICD-10-CM

## 2018-02-11 DIAGNOSIS — R002 Palpitations: Secondary | ICD-10-CM

## 2018-02-11 DIAGNOSIS — R030 Elevated blood-pressure reading, without diagnosis of hypertension: Secondary | ICD-10-CM | POA: Diagnosis not present

## 2018-02-11 DIAGNOSIS — F5103 Paradoxical insomnia: Secondary | ICD-10-CM

## 2018-02-11 NOTE — Patient Instructions (Signed)
Cyclic sleep disorder versus chronic insomnia in delayed sleep phase syndrome.   Follow the sleep hygiene instruction.   Sleep Study  Amanda [name] will be scheduled for an in laboratory sleep study, arrivin at either 8 or 9 PM, and to  leave next morning between 5:00 a.m - 6:00 a.m.  The sleep study consists of a recording of your brain waves (EEG). Breathing, heart rate and rhythm (ECG), oxygen level, eye movement, and leg movement.  The technician will glue or or paste several electrodes to your scalp, face, chest and legs.  You will have belts around your chest and abdomen to record breathing and a finger clasp to check blood oxygen levels.  A tube at your mouth and nose will detect airflow.  There are no needle sticks or painful procedures of any sort.  You will have your own room, and we will make every effort to attend to your comfort and privacy.  Please prepare for your study by the following steps:   Please avoid coffee, tea, soda, chocolate and other caffeine foods or beverages after 12:00 noon on the day of your sleep study.   You must arrive with clean (no oils), conditioners or make up, and please make sure that you wash your hair to ensure that your hair and scalp are clean, dry and free of any hair extensions on the day of your study.  This will help to get a good reading of study.  Please try not to nap on the day of your study.  Please bring a list of all your medications.  Bring any medications that you might need during the time you are within the laboratory, including insulin, sleeping pills, pain medication and anxiety medications.  Bring snacks, water or juice  Please bring clothes to sleep in and your normal overnight bag.  Please leave valuable at home, as we will not be responsible for any lost items.  If you have any further questions, please feel free to call our office. Thank you  Please call our office 48 in advance to cancel or reschedule to avoid a  $100.00 early cancel or no show fee.

## 2018-02-11 NOTE — Progress Notes (Signed)
SLEEP MEDICINE CLINIC   Provider:  Larey Seat, MD   Primary Care Physician:  Antony Contras, MD   Referring Provider: Comer Locket , PA  at [place].    Chief Complaint  Patient presents with  . New Patient (Initial Visit)    pt alone, rm 11. pt states that for years she has struggled with insomnia. pt sees a psyhiatric MD about this and so far nothing they have tried has helped with sleeping. pt attempts to lay down between 10-11 pm and it can be 3-4 am before she falls asleep and she may get 4-5 hrs of sleep. denies snoring/apnea in sleep.never had a sleep study     HPI:  Amanda [name] is a 58 y.o. female patient , seen here on 02-11-2018 in a referral from [facility], [place].   Chief complaint according to patient :" I am tired, I lay down and my mind starts racine- I am wide awake and can't stop the circuit." and: " I tried sleep aids, Ambien, Belsomra, Lunesta, some worked some only for a short time"   [employer] has tried antidepressants, and anxiolytics. It became worse after menopause.  Her last period was at age 76- but she still has hot flushes and mood changes. She gained weight- ongoing problem. She is now on doxepin and klonopin and gained another 5 pounds, feeling excessively fatigued.  She also reports that she goes to the gym but she is feeling as if her lungs are really heavy and easily sore she does not recognize this exercise tolerance as her normal baseline.  She has definitely become less conditioned in spite of making the effort to walk and exercise.  She has plantar fasciitis in both feet which also can contribute to uncomfortable pain at exercise, but in general she feels not in pain at all.  the patient has a 58 year old adopted daughter.    Sleep habits are as follows: dinner time is around 6 Pm and bedtime is at 10.30 Pm- the bedroom is cool, dark and quiet. Ceiling fan for air movements. Wife shares the bed. She sleeps on a comfortable firm  mattress, sleeps on one pillow. She may be awake for hours - until 2-4 Am and sleeps 6 hours.  She prefers supine sleep , with a slight elevation of the head of bed. Her spouse reports she is restless, and may snore.  Bruxism treated with a night guard. She is moving a lot in bed- dozing off and in the process jerks and is awake again. 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  She feels in AM that she now could sleep through until noon.  She often wakes up with headaches that resolve during the morning on their own. Not a migraine. No cluster . Naps in daytime?  - never.   Sleep medical history; paradoxical effects of alcohol - can't sleep.  One time at age 53-16 she would night sit an elderly lady in her house. A man on a bicycle tried to enter the home through an open window, but did not succeed. No history of PTSD.   Family sleep history: father with severe insomnia, alcohol abuse history.   Social history:  Self employed Engineer, maintenance (IT). Lives with same sex spouse, living together 78 years ,  married 28. Pets- 2 dogs , one cat.  Adopted daughter is soon 76, healthy. Non smoker, non drinker, caffeine - in AM coffee but none  after noon. No energy drinks.    Review of Systems: Out of a complete 14 system review, the patient complains of only the following symptoms, and all other reviewed systems are negative.  may snore, may have gasping,   Epworth Sleepiness score: 3/ 24     Fatigue severity score 54 / 63  , depression score; N?A see  Records of referring provider.   Social History   Socioeconomic History  . Marital status: Married    Spouse name: Not on file  . Number of children: 1  . Years of education: Not on file  . Highest education level: Not on file  Occupational History  . Occupation: Aeronautical engineer: Art therapist  Social Needs  . Financial resource strain: Not on file  . Food insecurity:    Worry: Not on file    Inability: Not on file  .  Transportation needs:    Medical: Not on file    Non-medical: Not on file  Tobacco Use  . Smoking status: Former Smoker    Packs/day: 0.50    Years: 5.00    Pack years: 2.50    Types: Cigarettes    Last attempt to quit: 01/29/1987    Years since quitting: 31.0  . Smokeless tobacco: Never Used  Substance and Sexual Activity  . Alcohol use: Yes    Comment: social ETOH only  . Drug use: No  . Sexual activity: Not on file  Lifestyle  . Physical activity:    Days per week: Not on file    Minutes per session: Not on file  . Stress: Not on file  Relationships  . Social connections:    Talks on phone: Not on file    Gets together: Not on file    Attends religious service: Not on file    Active member of club or organization: Not on file    Attends meetings of clubs or organizations: Not on file    Relationship status: Not on file  . Intimate partner violence:    Fear of current or ex partner: Not on file    Emotionally abused: Not on file    Physically abused: Not on file    Forced sexual activity: Not on file  Other Topics Concern  . Not on file  Social History Narrative  . Not on file    Family History  Problem Relation Age of Onset  . Asthma Father   . Heart disease Father        CAD s/p CABG  . Asthma Sister   . Asthma Paternal Aunt     Past Medical History:  Diagnosis Date  . Anxiety   . Asthma   . Chronic kidney disease   . Hyperlipidemia   . Hypertension   . Migraine     Past Surgical History:  Procedure Laterality Date  . APPENDECTOMY    . CARPAL TUNNEL RELEASE     rt  . FOOT SURGERY Bilateral    Plantar fasciitis  . OOPHORECTOMY      Current Outpatient Medications  Medication Sig Dispense Refill  . Cholecalciferol (VITAMIN D3) 1.25 MG (50000 UT) CAPS Take 1 capsule by mouth once a week.    . clonazePAM (KLONOPIN) 1 MG tablet Pt to take 1 hs. Please use Mylan brand generic. 30 tablet 1  . doxepin (SINEQUAN) 10 MG capsule Take 1 capsule (10 mg  total) by mouth at bedtime. 30 capsule 1  . metoprolol tartrate (LOPRESSOR) 25  MG tablet Take 1 tablet by mouth 2 (two) times daily.    . rizatriptan (MAXALT) 10 MG tablet rizatriptan 10 mg tablet    . vitamin B-12 (CYANOCOBALAMIN) 1000 MCG tablet Take 1,000 mcg by mouth.    . Vitamin D, Ergocalciferol, (DRISDOL) 50000 units CAPS capsule TK 1 C PO 1 TIME A WK  1   No current facility-administered medications for this visit.     Allergies as of 02/11/2018 - Review Complete 02/11/2018  Allergen Reaction Noted  . Ace inhibitors  02/23/2008  . Amoxicillin  08/28/2007  . Codeine Nausea And Vomiting 11/16/2010  . Cortisone  03/20/2011  . Hydrocodone-acetaminophen    . Losartan potassium  02/23/2008  . Statins  07/28/2017  . Zetia [ezetimibe] Diarrhea 11/05/2017   Vitals: BP 121/82   Pulse 61   Ht 5\' 2"  (1.575 m)   Wt 152 lb (68.9 kg)   LMP 03/23/2011   BMI 27.80 kg/m  Last Weight:   Wt Readings from Last 1 Encounters:  02/11/18 152 lb (68.9 kg)   JSH:FWYO mass index is 27.8 kg/m.     Last Height:   Ht Readings from Last 1 Encounters:  02/11/18 5\' 2"  (1.575 m)    Physical exam:  General: The patient is awake, alert and appears not in acute distress. The patient is well groomed. Head: Normocephalic, atraumatic. Neck is supple. Mallampati 3. neck circumference:14. 75". Nasal airflow patent ,  Retrognathia is not seen.  Cardiovascular:  Regular rate and rhythm , without  murmurs or carotid bruit, and without distended neck veins. Respiratory: Lungs are clear to auscultation. Skin:  Without evidence of edema, or rash Trunk: BMI is 28,  The patient's posture is relaxed.   Neurologic exam : The patient is awake and alert, oriented to place and time.    Attention span & concentration ability appears normal.  Speech is fluent,  Without dysarthria, dysphonia or aphasia.  Mood and affect are appropriate.  Cranial nerves: Pupils are equal and briskly reactive to light.  Funduscopic exam without evidence of pallor or edema. Extraocular movements  in vertical and horizontal planes intact and without nystagmus. Visual fields by finger perimetry are intact. Hearing to finger rub intact.   Facial sensation intact to fine touch.  Facial motor strength is symmetric and tongue and uvula move midline. Shoulder shrug was symmetrical.   Motor exam:  Normal tone, muscle bulk and symmetric strength in all extremities. Sensory:  Fine touch, pinprick and vibration were tested in all extremities. Proprioception tested in the upper extremities was normal. Coordination: Rapid alternating movements in the fingers/hands was normal. Finger-to-nose maneuver  normal without evidence of ataxia, dysmetria or tremor. Gait and station: Patient walks without assistive device  Stance is stable and normal.   Deep tendon reflexes: in the upper and lower extremities are symmetric and intact. Babinski maneuver response is downgoing.   Assessment:  After physical and neurologic examination, review of laboratory studies,  Personal review of imaging studies, reports of other /same  Imaging studies, results of polysomnography and / or neurophysiology testing and pre-existing records as far as provided in visit., my assessment is:    1) chronic insomnia,, resistant to many sleep aids.   There may be a perception difference, too. There is sleep myoclonus described. Hot flushes.  She feels she no longer dreams, likely REM sleep supression by medication. She has never tried Seroquel for sleep. She continues to see a counselor- we may arrange for biofeedback / meditation with  clay Shugard, PA>     The patient was advised of the nature of the diagnosed disorder ( insomnia, chronic, with anxiety, racing thoughts).  , the treatment options and the risks for general health and wellness arising from not treating the condition.   I spent more than 50 minutes of face to face time with the patient.   Greater than 50% of time was spent in counseling and coordination of care. We have discussed the diagnosis and differential and I answered the patient's questions.    Plan:  Treatment plan and additional workup :  14 day boot camp. Phytoestrogen OTC. Attended sleep study- bring your medication.   Rv after sleep study with expanded EEG, good video and audio.      Larey Seat, MD 07/26/6379, 77:11 PM  Certified in Neurology by ABPN Certified in [place] by [facility] [address] [place],  [address]

## 2018-02-12 ENCOUNTER — Ambulatory Visit
Admission: RE | Admit: 2018-02-12 | Discharge: 2018-02-12 | Disposition: A | Discharge status: Home / Self Care | Payer: BLUE CROSS/BLUE SHIELD | Source: Ambulatory Visit | Attending: Obstetrics and Gynecology | Admitting: Obstetrics and Gynecology

## 2018-02-12 DIAGNOSIS — Z1231 Encounter for screening mammogram for malignant neoplasm of breast: Secondary | ICD-10-CM

## 2018-02-13 ENCOUNTER — Other Ambulatory Visit: Payer: Self-pay | Admitting: Obstetrics and Gynecology

## 2018-02-13 DIAGNOSIS — R928 Other abnormal and inconclusive findings on diagnostic imaging of breast: Secondary | ICD-10-CM

## 2018-02-18 ENCOUNTER — Ambulatory Visit
Admission: RE | Admit: 2018-02-18 | Discharge: 2018-02-18 | Disposition: A | Discharge status: Home / Self Care | Payer: BLUE CROSS/BLUE SHIELD | Source: Ambulatory Visit | Attending: Obstetrics and Gynecology | Admitting: Obstetrics and Gynecology

## 2018-02-18 ENCOUNTER — Ambulatory Visit: Payer: BLUE CROSS/BLUE SHIELD

## 2018-02-18 DIAGNOSIS — R928 Other abnormal and inconclusive findings on diagnostic imaging of breast: Secondary | ICD-10-CM | POA: Diagnosis not present

## 2018-02-22 ENCOUNTER — Other Ambulatory Visit: Payer: Self-pay | Admitting: Psychiatry

## 2018-02-25 ENCOUNTER — Ambulatory Visit: Payer: BLUE CROSS/BLUE SHIELD | Admitting: Psychiatry

## 2018-02-25 DIAGNOSIS — F329 Major depressive disorder, single episode, unspecified: Secondary | ICD-10-CM

## 2018-02-25 DIAGNOSIS — F419 Anxiety disorder, unspecified: Secondary | ICD-10-CM

## 2018-02-25 DIAGNOSIS — F32A Depression, unspecified: Secondary | ICD-10-CM

## 2018-02-25 DIAGNOSIS — F5104 Psychophysiologic insomnia: Secondary | ICD-10-CM | POA: Diagnosis not present

## 2018-02-25 MED ORDER — CLONAZEPAM 1 MG PO TABS: ORAL_TABLET | ORAL | 2 refills | Status: DC

## 2018-02-25 MED ORDER — BUPROPION HCL ER (XL) 150 MG PO TB24: 150.0000 mg | ORAL_TABLET | Freq: Every day | ORAL | 1 refills | Status: DC

## 2018-02-25 NOTE — Progress Notes (Signed)
[facility]  Patient ID: Amanda [name],  MRN: [id number]  PCP: Antony Contras, MD  Date of Evaluation: 02/25/2018 Time spent:20 minutes  Chief Complaint:   HISTORY/CURRENT STATUS: HPI patient seen 919.  He is diagnosed with anxiety, seasonal depression, insomnia.  At the last visit I placed her on doxepin 10 mg at bedtime to help with sleep and to help some with depression.  Depression is slightly better.  Sleep is still poor.  She is seeing Dr. Brett Fairy [facility] for sleep.  Also complained of weight gain on the doxepin. Patient is asking about Wellbutrin.  Individual Medical History/ Review of Systems: Changes? :No   Allergies: Ace inhibitors; Amoxicillin; Codeine; Cortisone; Hydrocodone-acetaminophen; Losartan potassium; Statins; and Zetia [ezetimibe]  Current Medications:  Current Outpatient Medications:  .  clonazePAM (KLONOPIN) 1 MG tablet, TAKE 1 TABLET BY MOUTH AT BEDTIME, Disp: 30 tablet, Rfl: 2 .  buPROPion (WELLBUTRIN XL) 150 MG 24 hr tablet, Take 1 tablet (150 mg total) by mouth daily., Disp: 30 tablet, Rfl: 1 .  Cholecalciferol (VITAMIN D3) 1.25 MG (50000 UT) CAPS, Take 1 capsule by mouth once a week., Disp: , Rfl:  .  metoprolol tartrate (LOPRESSOR) 25 MG tablet, Take 1 tablet by mouth 2 (two) times daily., Disp: , Rfl:  .  rizatriptan (MAXALT) 10 MG tablet, rizatriptan 10 mg tablet, Disp: , Rfl:  .  vitamin B-12 (CYANOCOBALAMIN) 1000 MCG tablet, Take 1,000 mcg by mouth., Disp: , Rfl:  .  Vitamin D, Ergocalciferol, (DRISDOL) 50000 units CAPS capsule, TK 1 C PO 1 TIME A WK, Disp: , Rfl: 1 Medication Side Effects: none  Family Medical/ Social History: Changes? No  MENTAL HEALTH EXAM:  Last menstrual period 03/23/2011.There is no height or weight on file to calculate BMI.  General Appearance: Casual  Eye Contact:  Good  Speech:  Clear and Coherent  Volume:  Normal  Mood:  Anxious and Depressed  Affect:  Appropriate  Thought Process:  Linear  Orientation:   Full (Time, Place, and Person)  Thought Content: Logical   Suicidal Thoughts:  No  Homicidal Thoughts:  No  Memory:  WNL  Judgement:  Good  Insight:  Good  Psychomotor Activity:  Normal  Concentration:  Concentration: Good  Recall:  Good  Fund of Knowledge: Good  Language: Good  Assets:  Desire for Improvement  ADL's:  Intact  Cognition: WNL  Prognosis:  Good    DIAGNOSES:    ICD-10-CM   1. Anxiety F41.9   2. Depression, unspecified depression type F32.9   3. Chronic insomnia F51.04     Receiving Psychotherapy: No Patient to consider.  RECOMMENDATIONS: We will start patient on Wellbutrin XL 150 she is to take 1 a day until next visit.  She can continue on the Klonopin 1 mg at bedtime.  I will see her again in 6 weeks.   Comer Locket, PA-C

## 2018-03-03 NOTE — Telephone Encounter (Signed)
Great. Thanks [place].   Gerald Stabs

## 2018-03-03 NOTE — Telephone Encounter (Signed)
Spoke with pt - she reports rash on her face returned even with low dose of pravastatin every other day. This also occurred with rosuvastatin and atorvastatin, and she had GI upset with Zetia. LDL goal < 100 for primary prevention with strong family history of CAD.   Discussed bempedoic acid with pt which is expected to lower LDL 20% and should be FDA approved in the next few weeks. She is interested in this option. Will plan to call pt once pharmacies are able to order bempedoic acid.

## 2018-03-04 NOTE — Progress Notes (Signed)
Chief Complaint  Patient presents with  . Follow-up    palpitations    History of Present Illness: 58 yo female with history of HLD and HTN here today for follow up. I saw her as a new patient for the evaluation of her hyperlipidemia and palpitations in June 2019. Marland Kitchen She has been tried on Lipitor and Crestor and developed a rash on her face and legs. Palpitations at night. She feels her heart racing when in bed. No chest pain or dyspnea, LE edema or syncope. Also reported fatigue. I ordered an echo but she did not come in for the study. 48 hour cardiac monitor with rare PACs and PVCs. She was referred to the lipid clinic and started on Zetia and continued on Lovaza. She did not tolerate Zetia due to diarrhea. She was tried on Pravastatin but did not tolerate. She is now awaiting approval of bempedoic acid.   She is here today for follow up. The patient denies any chest pain, dyspnea, palpitations, lower extremity edema, orthopnea, PND, dizziness, near syncope or syncope. She feels well overall. She does not wish to reschedule the echo right now. She is having a sleep study tonight.   Primary Care Physician: Antony Contras, MD  Past Medical History:  Diagnosis Date  . Anxiety   . Asthma   . Chronic kidney disease   . Hyperlipidemia   . Hypertension   . Migraine     Past Surgical History:  Procedure Laterality Date  . APPENDECTOMY    . CARPAL TUNNEL RELEASE     rt  . FOOT SURGERY Bilateral    Plantar fasciitis  . OOPHORECTOMY      Current Outpatient Medications  Medication Sig Dispense Refill  . buPROPion (WELLBUTRIN XL) 150 MG 24 hr tablet Take 1 tablet (150 mg total) by mouth daily. 30 tablet 1  . Cholecalciferol (VITAMIN D3) 1.25 MG (50000 UT) CAPS Take 1 capsule by mouth once a week.    . clonazePAM (KLONOPIN) 1 MG tablet TAKE 1 TABLET BY MOUTH AT BEDTIME 30 tablet 2  . metoprolol tartrate (LOPRESSOR) 25 MG tablet Take 1 tablet by mouth 2 (two) times daily.    . rizatriptan  (MAXALT) 10 MG tablet rizatriptan 10 mg tablet    . vitamin B-12 (CYANOCOBALAMIN) 1000 MCG tablet Take 1,000 mcg by mouth.    . Vitamin D, Ergocalciferol, (DRISDOL) 50000 units CAPS capsule TK 1 C PO 1 TIME A WK  1   No current facility-administered medications for this visit.     Allergies  Allergen Reactions  . Ace Inhibitors     REACTION: cough  . Amoxicillin     Gi upset  . Codeine Nausea And Vomiting    Can tolerate hydrocodone if has food on stomach Can tolerate hydrocodone if has food on stomach   . Cortisone     Extremely high BP  . Hydrocodone-Acetaminophen     REACTION: vomitting  . Losartan Potassium     REACTION: cough  . Statins     Face and leg rash with rosuvastatin, pravastatin, and atorvastatin  . Zetia [Ezetimibe] Diarrhea    Social History   Socioeconomic History  . Marital status: Married    Spouse name: Not on file  . Number of children: 1  . Years of education: Not on file  . Highest education level: Not on file  Occupational History  . Occupation: Aeronautical engineer: Art therapist  Social Needs  . Financial resource strain:  Not on file  . Food insecurity:    Worry: Not on file    Inability: Not on file  . Transportation needs:    Medical: Not on file    Non-medical: Not on file  Tobacco Use  . Smoking status: Former Smoker    Packs/day: 0.50    Years: 5.00    Pack years: 2.50    Types: Cigarettes    Last attempt to quit: 01/29/1987    Years since quitting: 31.1  . Smokeless tobacco: Never Used  Substance and Sexual Activity  . Alcohol use: Yes    Comment: social ETOH only  . Drug use: No  . Sexual activity: Not on file  Lifestyle  . Physical activity:    Days per week: Not on file    Minutes per session: Not on file  . Stress: Not on file  Relationships  . Social connections:    Talks on phone: Not on file    Gets together: Not on file    Attends religious service: Not on file    Active member of club or organization:  Not on file    Attends meetings of clubs or organizations: Not on file    Relationship status: Not on file  . Intimate partner violence:    Fear of current or ex partner: Not on file    Emotionally abused: Not on file    Physically abused: Not on file    Forced sexual activity: Not on file  Other Topics Concern  . Not on file  Social History Narrative  . Not on file    Family History  Problem Relation Age of Onset  . Asthma Father   . Heart disease Father        CAD s/p CABG  . Asthma Sister   . Asthma Paternal Aunt     Review of Systems:  As stated in the HPI and otherwise negative.   BP 128/80   Pulse 63   Ht 5\' 2"  (1.575 m)   Wt 154 lb 1.9 oz (69.9 kg)   LMP 03/23/2011   SpO2 93%   BMI 28.19 kg/m   Physical Examination: General: Well developed, well nourished, NAD  HEENT: OP clear, mucus membranes moist  SKIN: warm, dry. No rashes. Neuro: No focal deficits  Musculoskeletal: Muscle strength 5/5 all ext  Psychiatric: Mood and affect normal  Neck: No JVD, no carotid bruits, no thyromegaly, no lymphadenopathy.  Lungs:Clear bilaterally, no wheezes, rhonci, crackles Cardiovascular: Regular rate and rhythm. No murmurs, gallops or rubs. Abdomen:Soft. Bowel sounds present. Non-tender.  Extremities: No lower extremity edema. Pulses are 2 + in the bilateral DP/PT.  EKG:  EKG is not ordered today. The ekg ordered today demonstrates   Recent Labs: 10/28/2017: ALT 19   Lipid Panel    Component Value Date/Time   CHOL 194 10/28/2017 0835   TRIG 183 (H) 10/28/2017 0835   HDL 44 10/28/2017 0835   CHOLHDL 4.4 10/28/2017 0835   CHOLHDL 4 05/25/2008 1036   VLDL 13.8 05/25/2008 1036   LDLCALC 113 (H) 10/28/2017 0835   LDLDIRECT 121.7 07/02/2007 0906     Wt Readings from Last 3 Encounters:  03/05/18 154 lb 1.9 oz (69.9 kg)  02/11/18 152 lb (68.9 kg)  07/07/17 147 lb 12.8 oz (67 kg)     Other studies Reviewed: Additional studies/ records that were reviewed today  include: . Review of the above records demonstrates:    Assessment and Plan:   1. Palpitations:  Likely due to PACs/PVCs. Continue metoprolol.    2. Hyperlipidemia:  She has not tolerated statins or Zetia. She is being followed in the lipid clinic. Awaiting approval of bempedoic acid.   Current medicines are reviewed at length with the patient today.  The patient does not have concerns regarding medicines.  The following changes have been made:  no change  Labs/ tests ordered today include:   No orders of the defined types were placed in this encounter.    Disposition:   FU with me in 12 months   Signed, Lauree Chandler, MD 03/05/2018 9:41 AM    [facility] [address], [place], [place]  [address] Phone: [phone]; Fax: [phone]

## 2018-03-05 ENCOUNTER — Ambulatory Visit: Payer: BLUE CROSS/BLUE SHIELD | Admitting: Cardiovascular Disease

## 2018-03-05 ENCOUNTER — Encounter: Payer: Self-pay | Admitting: Cardiovascular Disease

## 2018-03-05 VITALS — BP 128/80 | HR 63 | Ht 62.0 in | Wt 154.1 lb

## 2018-03-05 DIAGNOSIS — E7800 Pure hypercholesterolemia, unspecified: Secondary | ICD-10-CM | POA: Diagnosis not present

## 2018-03-05 DIAGNOSIS — R002 Palpitations: Secondary | ICD-10-CM

## 2018-03-05 NOTE — Patient Instructions (Signed)
Medication Instructions:  Your physician recommends that you continue on your current medications as directed. Please refer to the Current Medication list given to you today.  If you need a refill on your cardiac medications before your next appointment, please call your pharmacy.   Lab work: none If you have labs (blood work) drawn today and your tests are completely normal, you will receive your results only by: . MyChart Message (if you have MyChart) OR . A paper copy in the mail If you have any lab test that is abnormal or we need to change your treatment, we will call you to review the results.  Testing/Procedures: none  Follow-Up: At [facility], you and your health needs are our priority.  As part of our continuing mission to provide you with exceptional heart care, we have created designated Provider Care Teams.  These Care Teams include your primary Cardiologist (physician) and Advanced Practice Providers (APPs -  Physician Assistants and Nurse Practitioners) who all work together to provide you with the care you need, when you need it. You will need a follow up appointment in 12 months.  Please call our office 4 months in advance to schedule this appointment.  You may see Christopher McAlhany, MD or one of the following Advanced Practice Providers on your designated Care Team:   Brittainy Simmons, PA-C Dayna Dunn, PA-C . Michele Lenze, PA-C  Any Other Special Instructions Will Be Listed Below (If Applicable).    

## 2018-03-22 ENCOUNTER — Ambulatory Visit (INDEPENDENT_AMBULATORY_CARE_PROVIDER_SITE_OTHER): Payer: BLUE CROSS/BLUE SHIELD | Admitting: Neurology

## 2018-03-22 DIAGNOSIS — R030 Elevated blood-pressure reading, without diagnosis of hypertension: Secondary | ICD-10-CM

## 2018-03-22 DIAGNOSIS — G4721 Circadian rhythm sleep disorder, delayed sleep phase type: Secondary | ICD-10-CM

## 2018-03-22 DIAGNOSIS — G4733 Obstructive sleep apnea (adult) (pediatric): Secondary | ICD-10-CM | POA: Diagnosis not present

## 2018-03-22 DIAGNOSIS — F5103 Paradoxical insomnia: Secondary | ICD-10-CM

## 2018-03-22 DIAGNOSIS — R002 Palpitations: Secondary | ICD-10-CM

## 2018-03-22 DIAGNOSIS — F5104 Psychophysiologic insomnia: Secondary | ICD-10-CM

## 2018-03-22 DIAGNOSIS — R358 Other polyuria: Secondary | ICD-10-CM

## 2018-03-22 DIAGNOSIS — R3589 Other polyuria: Secondary | ICD-10-CM

## 2018-03-23 DIAGNOSIS — N183 Chronic kidney disease, stage 3 (moderate): Secondary | ICD-10-CM | POA: Diagnosis not present

## 2018-03-23 DIAGNOSIS — G43909 Migraine, unspecified, not intractable, without status migrainosus: Secondary | ICD-10-CM | POA: Diagnosis not present

## 2018-03-23 DIAGNOSIS — E782 Mixed hyperlipidemia: Secondary | ICD-10-CM | POA: Diagnosis not present

## 2018-03-23 DIAGNOSIS — Z0000 Encounter for general adult medical examination without abnormal findings: Secondary | ICD-10-CM | POA: Diagnosis not present

## 2018-03-23 DIAGNOSIS — E538 Deficiency of other specified B group vitamins: Secondary | ICD-10-CM | POA: Diagnosis not present

## 2018-03-23 DIAGNOSIS — I129 Hypertensive chronic kidney disease with stage 1 through stage 4 chronic kidney disease, or unspecified chronic kidney disease: Secondary | ICD-10-CM | POA: Diagnosis not present

## 2018-03-23 DIAGNOSIS — E559 Vitamin D deficiency, unspecified: Secondary | ICD-10-CM | POA: Diagnosis not present

## 2018-03-30 NOTE — Procedures (Signed)
PATIENT'S NAME:  Shenequa, Howse DOB:      1960/05/12      MR#:    [id number]     DATE OF RECORDING: 03/22/2018  [facility] REFERRING M.D.:  Comer Locket PA-C Study Performed:   Baseline Polysomnogram with parasomnia and expanded EEG montage  HISTORY:  JILLAINE WAREN is a 58 y.o. female patient who was seen on 02-11-2018 in a referral from [place], [place].   Chief complaint according to patient:" I feel tired, but I lay down and my mind starts racing- I am wide awake and can't stop the circuit." and: " I tried sleep aids, Ambien, Belsomra, Lunesta, some worked some only for a short time"  Insomnia became worse after menopause.  Her last period was at age 69- but she still has hot flushes and mood changes. She gained weight- ongoing problem. PA Shugart has tried antidepressants, and anxiolytics. She is now on doxepin and Klonopin and gained another 5 pounds, feeling excessively fatigued.  She also reports that she goes to the gym but she is feeling as if her limbs are really heavy and easily sore. She does not recognize this exercise tolerance as her normal baseline.  She has definitely become less conditioned in spite of making the effort to walk and exercise.   She has plantar fasciitis in both feet which also can contribute to uncomfortable pain at exercise, but in general she feels not in pain at all. The patient has a 58 year old adopted daughter.  Her spouse reports she is restless, and may snore. Bruxism treated with a night guard.   The patient endorsed the Epworth Sleepiness Scale at 3/24 points.   The patient's weight 152 pounds with a height of 62 (inches), resulting in a BMI of 28.1 kg/m2. The patient's neck circumference measured 14.8 inches.  CURRENT MEDICATIONS: Vitamin D3, Klonopin, Sinequan, Lopressor, Maxalt, Vitamin B12, Drisdol,   PROCEDURE:  This is a multichannel digital polysomnogram utilizing the Somnostar 11.2 system.  Electrodes and sensors were applied and monitored per AASM  Specifications.   EEG, EOG, Chin and Limb EMG, were sampled at 200 Hz.  ECG, Snore and Nasal Pressure, Thermal Airflow, Respiratory Effort, CPAP Flow and Pressure, Oximetry was sampled at 50 Hz. Digital video and audio were recorded.      BASELINE STUDY: Lights Out was at 21:53 and Lights On at 05:44.  Total recording time (TRT) was 472 minutes, with a total sleep time (TST) of 163.5 minutes.    The patient's sleep latency was 299 minutes.  REM latency was 107.5 minutes.  The sleep efficiency was extremely low at 34.6 %.     SLEEP ARCHITECTURE: WASO (Wake after sleep onset) was 27 minutes.  There were 4 minutes in Stage N1, 90.5 minutes Stage N2, 9.5 minutes Stage N3 and 59.5 minutes in Stage REM.  The percentage of Stage N1 was 2.4%, Stage N2 was 55.4%, Stage N3 was 5.8% and Stage R (REM sleep) was 36.4%.    RESPIRATORY ANALYSIS:  There were a total of 31 respiratory events:  2 obstructive apneas, 1 central apneas and 0 mixed apneas with a total of 3 apneas and an apnea index (AI) of 1.1 /hour. There were 28 hypopneas with a hypopnea index of 10.3 /hour. The patient also had 42 respiratory event related arousals (RERAs). The total APNEA/HYPOPNEA INDEX (AHI) was 11.4/hour and the total RESPIRATORY DISTURBANCE INDEX was 26.8 /hour.  14 events occurred in REM sleep and 29 events in NREM. The  REM AHI was 14.1 /hour, versus a non-REM AHI of 9.8. The patient spent 91 minutes of total sleep time in the supine position and 73 minutes in non-supine. The supine AHI was 11.9 versus a non-supine AHI of 10.8.  OXYGEN SATURATION & C02:  The Wake baseline 02 saturation was 96%, with the lowest being 85%. Time spent below 89% saturation equaled 5 minutes.   PERIODIC LIMB MOVEMENTS: The arousals were noted as: 7 were spontaneous, 0 were associated with PLMs, and 47 were associated with respiratory events. The patient had a total of 0 Periodic Limb Movements.   Audio and video analysis did not show any abnormal or  unusual movements, behaviors, phonations or vocalizations.   Mild Snoring was noted. EKG was in keeping with normal sinus rhythm (NSR).  IMPRESSION:  Insomnia, with delayed sleep phase.   1) Delayed sleep onset after 2.30 AM with several hours of uninterrupted sleep.   2) Obstructive Sleep Apnea (OSA) 11.4/h mild accentuation in REM and supine sleep.  3) Mild Snoring. No PLMs and normal EKG.   RECOMMENDATIONS:  1. Advise referral for a dental device that can address Bruxism and snoring , and will be able to treat this mild form of apnea, too. 2. Insomnia without organic causes.     I certify that I have reviewed the entire raw data recording prior to the issuance of this report in accordance with the Standards of Accreditation of the American Academy of Sleep Medicine (AASM)    Larey Seat, MD   03-30-2018 Diplomat, American Board of Psychiatry and Neurology  Diplomat, American Board of [place] Director, [facility] [employer]

## 2018-03-30 NOTE — Addendum Note (Signed)
Addended by: Larey Seat on: 03/30/2018 12:35 PM   Modules accepted: Orders

## 2018-03-31 ENCOUNTER — Telehealth: Payer: Self-pay | Admitting: Neurology

## 2018-03-31 NOTE — Telephone Encounter (Signed)
Called the patient and reviewed her sleep study findings with her. Advised the patient that the study noted mild sleep apnea present. Her oxygen level and heart rate was not affected. Advised the patient insomnia was present. Advised the pt that Dr Brett Fairy recommends the patient have a dental referral to create a dental device that helps with treatment of mild sleep apnea and snoring as well as teeth grinding. Pt states that she already has a dental device made for that purpose. She states her main purpose out of the visit was to determine and gain insight on her insomnia and what can be done to help. Patient has stated as mentioned in sleep study that she has tried and failed medications to help assist with sleep. Advised the patient that recommendation was not made at this time for a change in medication. I did advise a lot of time for insomnia patients we offer a cognitive behavior therapy referral to assist with different techniques and ways to treat apnea. Pt verbalized understanding. I advised that I would discuss with Dr Brett Fairy and offer her suggestion on what she would recommend whether its a referral or medication. Also advised the patient to follow with the dentist that has made her mouth guard that she previously owns and inquire on whether this mouth guard helps also with the treatment of mild sleep apnea. Pt verbalized understanding. I will my chart message her with what Dr Brett Fairy recommends. Pt verbalized understanding.

## 2018-03-31 NOTE — Telephone Encounter (Signed)
-----   Message from Darleen Crocker, RN sent at 03/31/2018 10:27 AM EST -----  ----- Message ----- From: Larey Seat, MD Sent: 03/30/2018  12:33 PM EST To: Larey Seat, MD

## 2018-04-01 ENCOUNTER — Other Ambulatory Visit: Payer: Self-pay | Admitting: Neurology

## 2018-04-01 ENCOUNTER — Encounter: Payer: Self-pay | Admitting: Neurology

## 2018-04-01 DIAGNOSIS — F5103 Paradoxical insomnia: Secondary | ICD-10-CM

## 2018-04-01 DIAGNOSIS — F5104 Psychophysiologic insomnia: Secondary | ICD-10-CM

## 2018-04-01 DIAGNOSIS — F419 Anxiety disorder, unspecified: Secondary | ICD-10-CM

## 2018-04-01 DIAGNOSIS — G4721 Circadian rhythm sleep disorder, delayed sleep phase type: Secondary | ICD-10-CM

## 2018-04-01 NOTE — Telephone Encounter (Signed)
Dr Dohmeier reviewed the patients sleep study once more and reviewed the patient's concern and recommends the patient a referral to cognitive behavior therapy to help with treatment of her insomnia.

## 2018-04-09 ENCOUNTER — Ambulatory Visit: Payer: BLUE CROSS/BLUE SHIELD | Admitting: Psychiatry

## 2018-04-15 ENCOUNTER — Ambulatory Visit: Payer: BLUE CROSS/BLUE SHIELD | Admitting: Psychiatry

## 2018-04-21 ENCOUNTER — Ambulatory Visit: Payer: BLUE CROSS/BLUE SHIELD | Admitting: Psychiatry

## 2018-05-15 ENCOUNTER — Telehealth: Payer: Self-pay | Admitting: Pharmacist

## 2018-05-15 MED ORDER — BEMPEDOIC ACID 180 MG PO TABS: 180.0000 mg | ORAL_TABLET | Freq: Every day | ORAL | 3 refills | Status: DC

## 2018-05-15 NOTE — Telephone Encounter (Signed)
Called pt to follow up with lipids. Nexletol is now FDA approved and pt would like to try therapy. Prior auth sent and approved through 05/13/21. Copay card activated and provided to pharmacy. Pt would like to call back in a few months to schedule labs once COVID settles down.

## 2018-05-18 ENCOUNTER — Other Ambulatory Visit: Payer: Self-pay

## 2018-07-15 ENCOUNTER — Other Ambulatory Visit: Payer: Self-pay

## 2018-07-15 MED ORDER — BUPROPION HCL ER (XL) 150 MG PO TB24: 150.0000 mg | ORAL_TABLET | Freq: Every day | ORAL | 0 refills | Status: DC

## 2018-07-29 ENCOUNTER — Other Ambulatory Visit: Payer: Self-pay

## 2018-07-29 ENCOUNTER — Ambulatory Visit (INDEPENDENT_AMBULATORY_CARE_PROVIDER_SITE_OTHER): Payer: BC Managed Care – PPO | Admitting: Psychiatry

## 2018-07-29 ENCOUNTER — Encounter: Payer: Self-pay | Admitting: Psychiatry

## 2018-07-29 DIAGNOSIS — F329 Major depressive disorder, single episode, unspecified: Secondary | ICD-10-CM

## 2018-07-29 DIAGNOSIS — F419 Anxiety disorder, unspecified: Secondary | ICD-10-CM | POA: Diagnosis not present

## 2018-07-29 DIAGNOSIS — F32A Depression, unspecified: Secondary | ICD-10-CM

## 2018-07-29 DIAGNOSIS — F5104 Psychophysiologic insomnia: Secondary | ICD-10-CM

## 2018-07-29 MED ORDER — ESCITALOPRAM OXALATE 10 MG PO TABS: 10.0000 mg | ORAL_TABLET | Freq: Every day | ORAL | 1 refills | Status: DC

## 2018-07-29 MED ORDER — ALPRAZOLAM 1 MG PO TABS: 1.0000 mg | ORAL_TABLET | Freq: Every day | ORAL | 0 refills | Status: DC

## 2018-07-29 MED ORDER — BUPROPION HCL ER (XL) 150 MG PO TB24: 150.0000 mg | ORAL_TABLET | Freq: Every day | ORAL | 1 refills | Status: DC

## 2018-07-29 NOTE — Progress Notes (Signed)
Amanda [name] [id number] February 10, 1960 58 y.o.  Virtual Visit via Telephone Note  I connected with pt on 07/29/18 at  8:30 AM EDT by telephone and verified that I am speaking with the correct person using two identifiers.   I discussed the limitations, risks, security and privacy concerns of performing an evaluation and management service by telephone and the availability of in person appointments. I also discussed with the patient that there may be a patient responsible charge related to this service. The patient expressed understanding and agreed to proceed.   I discussed the assessment and treatment plan with the patient. The patient was provided an opportunity to ask questions and all were answered. The patient agreed with the plan and demonstrated an understanding of the instructions.   The patient was advised to call back or seek an in-person evaluation if the symptoms worsen or if the condition fails to improve as anticipated.  I provided 30 minutes of non-face-to-face time during this encounter.  The patient was located at home.  The provider was located at [place].   Thayer Headings, PMHNP   Subjective:   Patient ID:  Amanda [name] is a 58 y.o. (DOB August 17, 1960) female.  Chief Complaint:  Chief Complaint  Patient presents with  . Insomnia  . Depression  . Anxiety    HPI KEILYN HAGGARD presents for follow-up of anxiety and insomnia. She reports, "I'm still not sleeping at night." Reports that she does not fall asleep until 4 am many nights. She c/o "mind racing" when she tries to go to sleep. Reports that thoughts are not necessarily related to worry and may be about things from the past. Reports sleeping from 4-8 am. She reports that she continues to have some depression and has been feeling sad. Reports that she is having crying episodes which is new for her. She reports that her energy is low. Motivation is also low. Appetite has been ok. Reports that weight has  been stable. She reports impaired concentration. She reports with anxiety her thoughts will race, her foot will feel numb, and heart rate increases. She reports that episodes of anxiety will often come without apparent trigger. She reports that this has occurred more in the last 2 weeks. Denies SI.   Reports that she did not have anxiety or depression until menopause and "sleep issue was not as bad."   Past Psychiatric Medication Trials: Wellbutrin Trintellix Lexapro- "felt the best on." Reports that she had some hair loss Zoloft Celexa Viibryd Prozac Effexor Remeron Ambien- Ineffective and caused HA Belsomra Doxepin Klonopin xanax Gabapentin Abilify  Review of Systems:  Review of Systems  Musculoskeletal: Positive for back pain. Negative for gait problem.  Neurological: Negative for tremors.  Psychiatric/Behavioral:       Please refer to HPI    Medications: I have reviewed the patient's current medications.  Current Outpatient Medications  Medication Sig Dispense Refill  . Bempedoic Acid (NEXLETOL) 180 MG TABS Take 180 mg by mouth daily. 90 tablet 3  . buPROPion (WELLBUTRIN XL) 150 MG 24 hr tablet Take 1 tablet (150 mg total) by mouth daily. 30 tablet 1  . Cholecalciferol (VITAMIN D3) 1.25 MG (50000 UT) CAPS Take 1 capsule by mouth once a week.    . metoprolol tartrate (LOPRESSOR) 25 MG tablet Take 1 tablet by mouth 2 (two) times daily.    . rizatriptan (MAXALT) 10 MG tablet rizatriptan 10 mg tablet    . vitamin B-12 (CYANOCOBALAMIN) 1000 MCG tablet Take  1,000 mcg by mouth.    . Vitamin D, Ergocalciferol, (DRISDOL) 50000 units CAPS capsule TK 1 C PO 1 TIME A WK  1  . ALPRAZolam (XANAX) 1 MG tablet Take 1 tablet (1 mg total) by mouth at bedtime. 30 tablet 0  . escitalopram (LEXAPRO) 10 MG tablet Take 1 tablet (10 mg total) by mouth daily. 30 tablet 1   No current facility-administered medications for this visit.     Medication Side Effects: None  Allergies:   Allergies  Allergen Reactions  . Ace Inhibitors     REACTION: cough  . Amoxicillin     Gi upset  . Codeine Nausea And Vomiting    Can tolerate hydrocodone if has food on stomach Can tolerate hydrocodone if has food on stomach   . Cortisone     Extremely high BP  . Hydrocodone-Acetaminophen     REACTION: vomitting  . Losartan Potassium     REACTION: cough  . Statins     Face and leg rash with rosuvastatin, pravastatin, and atorvastatin  . Zetia [Ezetimibe] Diarrhea    Past Medical History:  Diagnosis Date  . Anxiety   . Asthma   . Chronic kidney disease   . Hyperlipidemia   . Hypertension   . Migraine     Family History  Problem Relation Age of Onset  . Asthma Father   . Heart disease Father        CAD s/p CABG  . Asthma Sister   . Asthma Paternal Aunt     Social History   Socioeconomic History  . Marital status: Married    Spouse name: Not on file  . Number of children: 1  . Years of education: Not on file  . Highest education level: Not on file  Occupational History  . Occupation: Aeronautical engineer: Art therapist  Social Needs  . Financial resource strain: Not on file  . Food insecurity    Worry: Not on file    Inability: Not on file  . Transportation needs    Medical: Not on file    Non-medical: Not on file  Tobacco Use  . Smoking status: Former Smoker    Packs/day: 0.50    Years: 5.00    Pack years: 2.50    Types: Cigarettes    Quit date: 01/29/1987    Years since quitting: 31.5  . Smokeless tobacco: Never Used  Substance and Sexual Activity  . Alcohol use: Yes    Comment: social ETOH only  . Drug use: No  . Sexual activity: Not on file  Lifestyle  . Physical activity    Days per week: Not on file    Minutes per session: Not on file  . Stress: Not on file  Relationships  . Social Herbalist on phone: Not on file    Gets together: Not on file    Attends religious service: Not on file    Active member of club or  organization: Not on file    Attends meetings of clubs or organizations: Not on file    Relationship status: Not on file  . Intimate partner violence    Fear of current or ex partner: Not on file    Emotionally abused: Not on file    Physically abused: Not on file    Forced sexual activity: Not on file  Other Topics Concern  . Not on file  Social History Narrative  . Not on file  Past Medical History, Surgical history, Social history, and Family history were reviewed and updated as appropriate.   Please see review of systems for further details on the patient's review from today.   Objective:   Physical Exam:  LMP 03/23/2011   Physical Exam Neurological:     Mental Status: She is alert and oriented to person, place, and time.     Cranial Nerves: No dysarthria.  Psychiatric:        Attention and Perception: Attention normal.        Mood and Affect: Mood is anxious and depressed.        Speech: Speech normal.        Behavior: Behavior is cooperative.        Thought Content: Thought content normal. Thought content is not paranoid or delusional. Thought content does not include homicidal or suicidal ideation. Thought content does not include homicidal or suicidal plan.        Cognition and Memory: Cognition and memory normal.        Judgment: Judgment normal.     Lab Review:     Component Value Date/Time   NA 140 05/25/2008 1036   K 4.1 05/25/2008 1036   CL 109 05/25/2008 1036   CO2 28 05/25/2008 1036   GLUCOSE 74 05/25/2008 1036   BUN 12 05/25/2008 1036   CREATININE 0.9 05/25/2008 1036   CALCIUM 10.0 03/23/2014 0000   PROT 6.7 10/28/2017 0835   ALBUMIN 4.2 10/28/2017 0835   AST 18 10/28/2017 0835   ALT 19 10/28/2017 0835   ALKPHOS 98 10/28/2017 0835   BILITOT 0.3 10/28/2017 0835   GFRNONAA 71.06 05/25/2008 1036   GFRAA 86 10/12/2007 1055       Component Value Date/Time   WBC 9.4 04/04/2011 1622   RBC 4.36 04/04/2011 1622   HGB 13.6 04/04/2011 1622   HCT  40.3 04/04/2011 1622   PLT 343.0 04/04/2011 1622   MCV 92.6 04/04/2011 1622   MCHC 33.8 04/04/2011 1622   RDW 12.5 04/04/2011 1622   LYMPHSABS 2.4 04/04/2011 1622   MONOABS 0.7 04/04/2011 1622   EOSABS 0.1 04/04/2011 1622   BASOSABS 0.1 04/04/2011 1622    No results found for: POCLITH, LITHIUM   No results found for: PHENYTOIN, PHENOBARB, VALPROATE, CBMZ   .res Assessment: Plan:   Discussed several tx options for anxiety, depression, and insomnia. Pt reports that she would like to re-start Lexapro since it was most effective for her and that potential benefits may outweigh previous side effects. Discussed starting Lexapro at 10 mg po qd to determine response and if she has side effects at lower dose, and then consider increase to 20 mg po qd if appropriate. Discussed that if she has inadequate response at 10 mg and intolerable side effects at 20 mg po qd, may consider dose of 15 mg po qd.  Recommend continuing Wellbutrin XL 150 mg po qd for the next month while awaiting response from Lexapro to continue partial benefit of Wellbutrin XL.  Discussed trial of Xanax in place of Klonopin since Xanax has faster onset and may therefore be more effective for anxiety that results in difficulty with sleep initiation.  Pt to f/u in 4 weeks or sooner if clinically indicated.  Patient advised to contact office with any questions, adverse effects, or acute worsening in signs and symptoms.  Chrisanne was seen today for insomnia, depression and anxiety.  Diagnoses and all orders for this visit:  Depression, unspecified depression type -  escitalopram (LEXAPRO) 10 MG tablet; Take 1 tablet (10 mg total) by mouth daily. -     buPROPion (WELLBUTRIN XL) 150 MG 24 hr tablet; Take 1 tablet (150 mg total) by mouth daily.  Chronic insomnia -     ALPRAZolam (XANAX) 1 MG tablet; Take 1 tablet (1 mg total) by mouth at bedtime.  Anxiety -     escitalopram (LEXAPRO) 10 MG tablet; Take 1 tablet (10 mg total) by  mouth daily. -     ALPRAZolam (XANAX) 1 MG tablet; Take 1 tablet (1 mg total) by mouth at bedtime.    Please see After Visit Summary for patient specific instructions.  No future appointments.  No orders of the defined types were placed in this encounter.     -------------------------------

## 2018-08-23 ENCOUNTER — Other Ambulatory Visit: Payer: Self-pay | Admitting: Psychiatry

## 2018-08-23 DIAGNOSIS — F419 Anxiety disorder, unspecified: Secondary | ICD-10-CM

## 2018-08-23 DIAGNOSIS — F5104 Psychophysiologic insomnia: Secondary | ICD-10-CM

## 2018-08-24 NOTE — Telephone Encounter (Signed)
Has appt 08/12

## 2018-09-09 ENCOUNTER — Other Ambulatory Visit: Payer: Self-pay

## 2018-09-09 ENCOUNTER — Encounter: Payer: Self-pay | Admitting: Psychiatry

## 2018-09-09 ENCOUNTER — Ambulatory Visit (INDEPENDENT_AMBULATORY_CARE_PROVIDER_SITE_OTHER): Payer: BC Managed Care – PPO | Admitting: Psychiatry

## 2018-09-09 DIAGNOSIS — F5104 Psychophysiologic insomnia: Secondary | ICD-10-CM | POA: Diagnosis not present

## 2018-09-09 DIAGNOSIS — F329 Major depressive disorder, single episode, unspecified: Secondary | ICD-10-CM

## 2018-09-09 DIAGNOSIS — F419 Anxiety disorder, unspecified: Secondary | ICD-10-CM | POA: Diagnosis not present

## 2018-09-09 DIAGNOSIS — F32A Depression, unspecified: Secondary | ICD-10-CM

## 2018-09-09 MED ORDER — ESCITALOPRAM OXALATE 10 MG PO TABS: 10.0000 mg | ORAL_TABLET | Freq: Every day | ORAL | 1 refills | Status: DC

## 2018-09-09 MED ORDER — BUPROPION HCL ER (XL) 150 MG PO TB24: 150.0000 mg | ORAL_TABLET | Freq: Every day | ORAL | 1 refills | Status: DC

## 2018-09-09 MED ORDER — ALPRAZOLAM 1 MG PO TABS: 1.0000 mg | ORAL_TABLET | Freq: Every evening | ORAL | 3 refills | Status: DC | PRN

## 2018-09-09 NOTE — Progress Notes (Signed)
Amanda [name] [id number] May 04, 1960 58 y.o.  Virtual Visit via Telephone Note  I connected with pt on 09/09/18 at 11:30 AM EDT by telephone and verified that I am speaking with the correct person using two identifiers.   I discussed the limitations, risks, security and privacy concerns of performing an evaluation and management service by telephone and the availability of in person appointments. I also discussed with the patient that there may be a patient responsible charge related to this service. The patient expressed understanding and agreed to proceed.   I discussed the assessment and treatment plan with the patient. The patient was provided an opportunity to ask questions and all were answered. The patient agreed with the plan and demonstrated an understanding of the instructions.   The patient was advised to call back or seek an in-person evaluation if the symptoms worsen or if the condition fails to improve as anticipated.  I provided 20 minutes of non-face-to-face time during this encounter.  The patient was located at home.  The provider was located at [place].   Thayer Headings, PMHNP   Subjective:   Patient ID:  Amanda [name] is a 58 y.o. (DOB 1961/01/02) female.  Chief Complaint:  Chief Complaint  Patient presents with  . Follow-up    h/o Depression, Anxiety, and Insomnia    HPI Amanda [name] presents for follow-up of depression, anxiety, and insomnia. She reports,  "I've been better...this is the best I have felt in a long time." She reports improved sleep and is having less "mind racing at night." Estimates sleeping 7-8 hours a night. She reports that she is not as depressed now. She reports that her energy is low and she is trying to get motivated to exercise. Reports some improvement in motivation. She reports that she has some anxiety and feels that it is manageable. Has not had any recent episodes of increased anxiety with physical s/s. She reports  some worry related to covid. Reports that her appetite has been normal. She reports that her concentration has been good and has been able to work for several consecutive hours. Denies SI.    Past Psychiatric Medication Trials: Wellbutrin Trintellix Lexapro- "felt the best on." Reports that she had some hair loss Zoloft Celexa Viibryd Prozac Effexor Remeron Ambien- Ineffective and caused HA Belsomra Doxepin Klonopin xanax Gabapentin Abilify   Review of Systems:  Review of Systems  Gastrointestinal: Negative.   Musculoskeletal: Negative for gait problem.  Allergic/Immunologic: Positive for environmental allergies.  Neurological: Negative for tremors.  Psychiatric/Behavioral:       Please refer to HPI    Medications: I have reviewed the patient's current medications.  Current Outpatient Medications  Medication Sig Dispense Refill  . [START ON 09/21/2018] ALPRAZolam (XANAX) 1 MG tablet Take 1 tablet (1 mg total) by mouth at bedtime as needed for anxiety. 30 tablet 3  . Bempedoic Acid (NEXLETOL) 180 MG TABS Take 180 mg by mouth daily. 90 tablet 3  . buPROPion (WELLBUTRIN XL) 150 MG 24 hr tablet Take 1 tablet (150 mg total) by mouth daily. 90 tablet 1  . Cholecalciferol (VITAMIN D3) 1.25 MG (50000 UT) CAPS Take 1 capsule by mouth once a week.    . escitalopram (LEXAPRO) 10 MG tablet Take 1 tablet (10 mg total) by mouth daily. 90 tablet 1  . metoprolol tartrate (LOPRESSOR) 25 MG tablet Take 1 tablet by mouth 2 (two) times daily.    . rizatriptan (MAXALT) 10 MG tablet rizatriptan 10 mg  tablet    . vitamin B-12 (CYANOCOBALAMIN) 1000 MCG tablet Take 1,000 mcg by mouth.    . Vitamin D, Ergocalciferol, (DRISDOL) 50000 units CAPS capsule TK 1 C PO 1 TIME A WK  1   No current facility-administered medications for this visit.     Medication Side Effects: Other: Possible diminished sense of taste  Allergies:  Allergies  Allergen Reactions  . Ace Inhibitors     REACTION: cough   . Amoxicillin     Gi upset  . Codeine Nausea And Vomiting    Can tolerate hydrocodone if has food on stomach Can tolerate hydrocodone if has food on stomach   . Cortisone     Extremely high BP  . Hydrocodone-Acetaminophen     REACTION: vomitting  . Losartan Potassium     REACTION: cough  . Statins     Face and leg rash with rosuvastatin, pravastatin, and atorvastatin  . Zetia [Ezetimibe] Diarrhea    Past Medical History:  Diagnosis Date  . Anxiety   . Asthma   . Chronic kidney disease   . Hyperlipidemia   . Hypertension   . Migraine     Family History  Problem Relation Age of Onset  . Asthma Father   . Heart disease Father        CAD s/p CABG  . Asthma Sister   . Asthma Paternal Aunt     Social History   Socioeconomic History  . Marital status: Married    Spouse name: Not on file  . Number of children: 1  . Years of education: Not on file  . Highest education level: Not on file  Occupational History  . Occupation: Aeronautical engineer: Art therapist  Social Needs  . Financial resource strain: Not on file  . Food insecurity    Worry: Not on file    Inability: Not on file  . Transportation needs    Medical: Not on file    Non-medical: Not on file  Tobacco Use  . Smoking status: Former Smoker    Packs/day: 0.50    Years: 5.00    Pack years: 2.50    Types: Cigarettes    Quit date: 01/29/1987    Years since quitting: 31.6  . Smokeless tobacco: Never Used  Substance and Sexual Activity  . Alcohol use: Yes    Comment: social ETOH only  . Drug use: No  . Sexual activity: Not on file  Lifestyle  . Physical activity    Days per week: Not on file    Minutes per session: Not on file  . Stress: Not on file  Relationships  . Social Herbalist on phone: Not on file    Gets together: Not on file    Attends religious service: Not on file    Active member of club or organization: Not on file    Attends meetings of clubs or organizations:  Not on file    Relationship status: Not on file  . Intimate partner violence    Fear of current or ex partner: Not on file    Emotionally abused: Not on file    Physically abused: Not on file    Forced sexual activity: Not on file  Other Topics Concern  . Not on file  Social History Narrative  . Not on file    Past Medical History, Surgical history, Social history, and Family history were reviewed and updated as appropriate.   Please see  review of systems for further details on the patient's review from today.   Objective:   Physical Exam:  BP 126/73   Pulse 76   LMP 03/23/2011   Physical Exam Neurological:     Mental Status: She is alert and oriented to person, place, and time.     Cranial Nerves: No dysarthria.  Psychiatric:        Attention and Perception: Attention normal.        Mood and Affect: Mood normal.        Speech: Speech normal.        Behavior: Behavior is cooperative.        Thought Content: Thought content normal. Thought content is not paranoid or delusional. Thought content does not include homicidal or suicidal ideation. Thought content does not include homicidal or suicidal plan.        Cognition and Memory: Cognition and memory normal.        Judgment: Judgment normal.     Lab Review:     Component Value Date/Time   NA 140 05/25/2008 1036   K 4.1 05/25/2008 1036   CL 109 05/25/2008 1036   CO2 28 05/25/2008 1036   GLUCOSE 74 05/25/2008 1036   BUN 12 05/25/2008 1036   CREATININE 0.9 05/25/2008 1036   CALCIUM 10.0 03/23/2014 0000   PROT 6.7 10/28/2017 0835   ALBUMIN 4.2 10/28/2017 0835   AST 18 10/28/2017 0835   ALT 19 10/28/2017 0835   ALKPHOS 98 10/28/2017 0835   BILITOT 0.3 10/28/2017 0835   GFRNONAA 71.06 05/25/2008 1036   GFRAA 86 10/12/2007 1055       Component Value Date/Time   WBC 9.4 04/04/2011 1622   RBC 4.36 04/04/2011 1622   HGB 13.6 04/04/2011 1622   HCT 40.3 04/04/2011 1622   PLT 343.0 04/04/2011 1622   MCV 92.6  04/04/2011 1622   MCHC 33.8 04/04/2011 1622   RDW 12.5 04/04/2011 1622   LYMPHSABS 2.4 04/04/2011 1622   MONOABS 0.7 04/04/2011 1622   EOSABS 0.1 04/04/2011 1622   BASOSABS 0.1 04/04/2011 1622    No results found for: POCLITH, LITHIUM   No results found for: PHENYTOIN, PHENOBARB, VALPROATE, CBMZ   .res Assessment: Plan:   Will continue current plan of care since pt reports that s/s are well controlled without any tolerability issues. Patient to follow-up in 3 months or sooner if clinically indicated. Patient advised to contact office with any questions, adverse effects, or acute worsening in signs and symptoms.  Amanda [name] was seen today for follow-up.  Diagnoses and all orders for this visit:  Depression, unspecified depression type -     buPROPion (WELLBUTRIN XL) 150 MG 24 hr tablet; Take 1 tablet (150 mg total) by mouth daily. -     escitalopram (LEXAPRO) 10 MG tablet; Take 1 tablet (10 mg total) by mouth daily.  Anxiety -     escitalopram (LEXAPRO) 10 MG tablet; Take 1 tablet (10 mg total) by mouth daily. -     ALPRAZolam (XANAX) 1 MG tablet; Take 1 tablet (1 mg total) by mouth at bedtime as needed for anxiety.  Chronic insomnia -     ALPRAZolam (XANAX) 1 MG tablet; Take 1 tablet (1 mg total) by mouth at bedtime as needed for anxiety.    Please see After Visit Summary for patient specific instructions.  No future appointments.  No orders of the defined types were placed in this encounter.     -------------------------------

## 2018-09-22 DIAGNOSIS — G43909 Migraine, unspecified, not intractable, without status migrainosus: Secondary | ICD-10-CM | POA: Diagnosis not present

## 2018-09-22 DIAGNOSIS — N183 Chronic kidney disease, stage 3 (moderate): Secondary | ICD-10-CM | POA: Diagnosis not present

## 2018-09-22 DIAGNOSIS — I129 Hypertensive chronic kidney disease with stage 1 through stage 4 chronic kidney disease, or unspecified chronic kidney disease: Secondary | ICD-10-CM | POA: Diagnosis not present

## 2018-09-22 DIAGNOSIS — E559 Vitamin D deficiency, unspecified: Secondary | ICD-10-CM | POA: Diagnosis not present

## 2018-10-23 DIAGNOSIS — Z23 Encounter for immunization: Secondary | ICD-10-CM | POA: Diagnosis not present

## 2018-10-23 DIAGNOSIS — E782 Mixed hyperlipidemia: Secondary | ICD-10-CM | POA: Diagnosis not present

## 2018-10-23 DIAGNOSIS — E559 Vitamin D deficiency, unspecified: Secondary | ICD-10-CM | POA: Diagnosis not present

## 2019-01-17 ENCOUNTER — Other Ambulatory Visit: Payer: Self-pay | Admitting: Psychiatry

## 2019-01-17 DIAGNOSIS — F419 Anxiety disorder, unspecified: Secondary | ICD-10-CM

## 2019-01-17 DIAGNOSIS — F5104 Psychophysiologic insomnia: Secondary | ICD-10-CM

## 2019-01-18 NOTE — Telephone Encounter (Signed)
Last apt 08/2018

## 2019-02-20 ENCOUNTER — Other Ambulatory Visit: Payer: Self-pay | Admitting: Psychiatry

## 2019-02-20 DIAGNOSIS — F419 Anxiety disorder, unspecified: Secondary | ICD-10-CM

## 2019-02-20 DIAGNOSIS — F5104 Psychophysiologic insomnia: Secondary | ICD-10-CM

## 2019-02-21 NOTE — Telephone Encounter (Signed)
Last visit 08/2018

## 2019-03-09 ENCOUNTER — Other Ambulatory Visit: Payer: Self-pay | Admitting: Psychiatry

## 2019-03-09 DIAGNOSIS — F32A Depression, unspecified: Secondary | ICD-10-CM

## 2019-03-09 DIAGNOSIS — F419 Anxiety disorder, unspecified: Secondary | ICD-10-CM

## 2019-03-09 DIAGNOSIS — F329 Major depressive disorder, single episode, unspecified: Secondary | ICD-10-CM

## 2019-03-23 ENCOUNTER — Other Ambulatory Visit: Payer: Self-pay

## 2019-03-23 DIAGNOSIS — F5104 Psychophysiologic insomnia: Secondary | ICD-10-CM

## 2019-03-23 DIAGNOSIS — F419 Anxiety disorder, unspecified: Secondary | ICD-10-CM

## 2019-03-23 MED ORDER — ALPRAZOLAM 1 MG PO TABS: ORAL_TABLET | ORAL | 0 refills | Status: DC

## 2019-03-24 ENCOUNTER — Other Ambulatory Visit: Payer: Self-pay | Admitting: Obstetrics and Gynecology

## 2019-03-24 DIAGNOSIS — Z1231 Encounter for screening mammogram for malignant neoplasm of breast: Secondary | ICD-10-CM

## 2019-03-26 ENCOUNTER — Other Ambulatory Visit: Payer: Self-pay

## 2019-03-26 DIAGNOSIS — F32A Depression, unspecified: Secondary | ICD-10-CM

## 2019-03-26 DIAGNOSIS — F329 Major depressive disorder, single episode, unspecified: Secondary | ICD-10-CM

## 2019-03-26 MED ORDER — BUPROPION HCL ER (XL) 150 MG PO TB24: 150.0000 mg | ORAL_TABLET | Freq: Every day | ORAL | 0 refills | Status: DC

## 2019-04-21 ENCOUNTER — Other Ambulatory Visit: Payer: Self-pay

## 2019-04-21 DIAGNOSIS — F5104 Psychophysiologic insomnia: Secondary | ICD-10-CM

## 2019-04-21 DIAGNOSIS — F419 Anxiety disorder, unspecified: Secondary | ICD-10-CM

## 2019-04-21 MED ORDER — ALPRAZOLAM 1 MG PO TABS: ORAL_TABLET | ORAL | 0 refills | Status: DC

## 2019-04-23 DIAGNOSIS — Z0000 Encounter for general adult medical examination without abnormal findings: Secondary | ICD-10-CM | POA: Diagnosis not present

## 2019-04-23 DIAGNOSIS — N1832 Chronic kidney disease, stage 3b: Secondary | ICD-10-CM | POA: Diagnosis not present

## 2019-04-23 DIAGNOSIS — G43909 Migraine, unspecified, not intractable, without status migrainosus: Secondary | ICD-10-CM | POA: Diagnosis not present

## 2019-04-23 DIAGNOSIS — I129 Hypertensive chronic kidney disease with stage 1 through stage 4 chronic kidney disease, or unspecified chronic kidney disease: Secondary | ICD-10-CM | POA: Diagnosis not present

## 2019-04-23 DIAGNOSIS — E538 Deficiency of other specified B group vitamins: Secondary | ICD-10-CM | POA: Diagnosis not present

## 2019-04-23 DIAGNOSIS — E782 Mixed hyperlipidemia: Secondary | ICD-10-CM | POA: Diagnosis not present

## 2019-04-23 DIAGNOSIS — E559 Vitamin D deficiency, unspecified: Secondary | ICD-10-CM | POA: Diagnosis not present

## 2019-04-28 ENCOUNTER — Ambulatory Visit
Admission: RE | Admit: 2019-04-28 | Discharge: 2019-04-28 | Disposition: A | Discharge status: Home / Self Care | Payer: BC Managed Care – PPO | Source: Ambulatory Visit | Attending: Obstetrics and Gynecology | Admitting: Obstetrics and Gynecology

## 2019-04-28 ENCOUNTER — Other Ambulatory Visit: Payer: Self-pay

## 2019-04-28 DIAGNOSIS — Z1231 Encounter for screening mammogram for malignant neoplasm of breast: Secondary | ICD-10-CM | POA: Diagnosis not present

## 2019-05-11 ENCOUNTER — Other Ambulatory Visit: Payer: Self-pay

## 2019-05-11 ENCOUNTER — Ambulatory Visit (INDEPENDENT_AMBULATORY_CARE_PROVIDER_SITE_OTHER): Payer: BC Managed Care – PPO | Admitting: Psychiatry

## 2019-05-11 ENCOUNTER — Encounter: Payer: Self-pay | Admitting: Psychiatry

## 2019-05-11 DIAGNOSIS — F32A Depression, unspecified: Secondary | ICD-10-CM

## 2019-05-11 DIAGNOSIS — F5104 Psychophysiologic insomnia: Secondary | ICD-10-CM | POA: Diagnosis not present

## 2019-05-11 DIAGNOSIS — F329 Major depressive disorder, single episode, unspecified: Secondary | ICD-10-CM

## 2019-05-11 DIAGNOSIS — F419 Anxiety disorder, unspecified: Secondary | ICD-10-CM | POA: Diagnosis not present

## 2019-05-11 MED ORDER — BUPROPION HCL ER (XL) 300 MG PO TB24: 300.0000 mg | ORAL_TABLET | Freq: Every day | ORAL | 0 refills | Status: DC

## 2019-05-11 MED ORDER — ALPRAZOLAM 1 MG PO TABS: ORAL_TABLET | ORAL | 3 refills | Status: DC

## 2019-05-11 MED ORDER — ESCITALOPRAM OXALATE 10 MG PO TABS: ORAL_TABLET | ORAL | 0 refills | Status: DC

## 2019-05-11 MED ORDER — NEXLETOL 180 MG PO TABS: 180.0000 mg | ORAL_TABLET | Freq: Every day | ORAL | 0 refills | Status: DC

## 2019-05-11 NOTE — Progress Notes (Signed)
Amanda [name] [id number] 09-03-1960 59 y.o.  Subjective:   Patient ID:  Amanda [name] is a 59 y.o. (DOB [birth year]) female.  Chief Complaint:  Chief Complaint  Patient presents with  . Depression  . Insomnia  . Anxiety    HPI Amanda [name] presents to the office today for follow-up of depression and insomnia.  She reports that some nights she has not been sleeping well. She reports that Xanax helps most nights and if she does not take it she has significant difficulty with sleep. She reports having some nights where she sleeps 7 hours a night, about 4-5 nights a week. She reports that she has trouble falling asleep the other nights- "it's not worry, my mind just won't shut off" and may sleep from 4 am- 7 or 8 am. Denies nightmares. Reports that sleep study in the past showed very mild sleep apnea. She reports that her anxiety has been under control.   Mood has been "pretty good." Occ 1-2 days of depressions where her motivation is low and she does not want to do things or leave the house. Motivation has been lower in general. Has to push herself through things she does note really want to do. She reports that she has gained weight and she attributes this to decreased activity. Energy has been very low. Appetite has been ok. Concentration has been diminished and has difficulty focusing. Denies anhedonia. Looking forward to moving. Looking forward to summer. Denies SI.   Has had 2nd COVID vaccination   Past Psychiatric Medication Trials: Wellbutrin Trintellix Lexapro- "felt the best on." Reports that she had some hair loss Zoloft Celexa Viibryd Prozac Effexor Remeron Ambien- Ineffective and caused HA Belsomra Doxepin Klonopin xanax Gabapentin Abilify  Review of Systems:  Review of Systems  Constitutional: Positive for fatigue.  Gastrointestinal:       Chronic GI s/s.  Musculoskeletal: Negative for gait problem.  Neurological: Negative for tremors.   Psychiatric/Behavioral:       Please refer to HPI   Saw PCP 04/28/19.   Medications: I have reviewed the patient's current medications.  Current Outpatient Medications  Medication Sig Dispense Refill  . ALPRAZolam (XANAX) 1 MG tablet TAKE 1 TABLET(1 MG) BY MOUTH AT BEDTIME AS NEEDED FOR ANXIETY. 30 tablet 3  . Bempedoic Acid (NEXLETOL) 180 MG TABS Take 180 mg by mouth daily. Please make overdue appt with Dr. Angelena Form before anymore refills. 1st attempt 30 tablet 0  . buPROPion (WELLBUTRIN XL) 300 MG 24 hr tablet Take 1 tablet (300 mg total) by mouth daily. 90 tablet 0  . Cholecalciferol (VITAMIN D3) 1.25 MG (50000 UT) CAPS Take 1 capsule by mouth once a week.    . escitalopram (LEXAPRO) 10 MG tablet TAKE 1 TABLET(10 MG) BY MOUTH DAILY 90 tablet 0  . metoprolol tartrate (LOPRESSOR) 25 MG tablet Take 1 tablet by mouth 2 (two) times daily.    . rizatriptan (MAXALT) 10 MG tablet rizatriptan 10 mg tablet    . vitamin B-12 (CYANOCOBALAMIN) 1000 MCG tablet Take 1,000 mcg by mouth.    . hydrochlorothiazide (MICROZIDE) 12.5 MG capsule Take 12.5 mg by mouth daily.    . Vitamin D, Ergocalciferol, (DRISDOL) 50000 units CAPS capsule TK 1 C PO 1 TIME A WK  1   No current facility-administered medications for this visit.    Medication Side Effects: None  Allergies:  Allergies  Allergen Reactions  . Ace Inhibitors     REACTION: cough  . Amoxicillin  Gi upset  . Codeine Nausea And Vomiting    Can tolerate hydrocodone if has food on stomach Can tolerate hydrocodone if has food on stomach   . Cortisone     Extremely high BP  . Hydrocodone-Acetaminophen     REACTION: vomitting  . Losartan Potassium     REACTION: cough  . Statins     Face and leg rash with rosuvastatin, pravastatin, and atorvastatin  . Zetia [Ezetimibe] Diarrhea    Past Medical History:  Diagnosis Date  . Anxiety   . Asthma   . Chronic kidney disease   . Hyperlipidemia   . Hypertension   . Migraine     Family  History  Problem Relation Age of Onset  . Asthma Father   . Heart disease Father        CAD s/p CABG  . Asthma Sister   . Asthma Paternal Aunt     Social History   Socioeconomic History  . Marital status: Married    Spouse name: Not on file  . Number of children: 1  . Years of education: Not on file  . Highest education level: Not on file  Occupational History  . Occupation: Aeronautical engineer: [employer]  Tobacco Use  . Smoking status: Former Smoker    Packs/day: 0.50    Years: 5.00    Pack years: 2.50    Types: Cigarettes    Quit date: 01/29/1987    Years since quitting: 32.3  . Smokeless tobacco: Never Used  Substance and Sexual Activity  . Alcohol use: Yes    Comment: social ETOH only  . Drug use: No  . Sexual activity: Not on file  Other Topics Concern  . Not on file  Social History Narrative  . Not on file   Social Determinants of Health   Financial Resource Strain:   . Difficulty of Paying Living Expenses:   Food Insecurity:   . Worried About Charity fundraiser in the Last Year:   . Arboriculturist in the Last Year:   Transportation Needs:   . Film/video editor (Medical):   Marland Kitchen Lack of Transportation (Non-Medical):   Physical Activity:   . Days of Exercise per Week:   . Minutes of Exercise per Session:   Stress:   . Feeling of Stress :   Social Connections:   . Frequency of Communication with Friends and Family:   . Frequency of Social Gatherings with Friends and Family:   . Attends Religious Services:   . Active Member of Clubs or Organizations:   . Attends Archivist Meetings:   Marland Kitchen Marital Status:   Intimate Partner Violence:   . Fear of Current or Ex-Partner:   . Emotionally Abused:   Marland Kitchen Physically Abused:   . Sexually Abused:     Past Medical History, Surgical history, Social history, and Family history were reviewed and updated as appropriate.   Please see review of systems for further details on the patient's  review from today.   Objective:   Physical Exam:  BP 127/79   Pulse 64   Wt 151 lb (68.5 kg)   LMP 03/23/2011   BMI 27.62 kg/m   Physical Exam Constitutional:      General: She is not in acute distress. Musculoskeletal:        General: No deformity.  Neurological:     Mental Status: She is alert and oriented to person, place, and time.  Coordination: Coordination normal.  Psychiatric:        Attention and Perception: Attention and perception normal. She does not perceive auditory or visual hallucinations.        Mood and Affect: Mood is depressed. Mood is not anxious. Affect is not labile, blunt, angry or inappropriate.        Speech: Speech normal.        Behavior: Behavior normal.        Thought Content: Thought content normal. Thought content is not paranoid or delusional. Thought content does not include homicidal or suicidal ideation. Thought content does not include homicidal or suicidal plan.        Cognition and Memory: Cognition and memory normal.        Judgment: Judgment normal.     Comments: Insight intact     Lab Review:     Component Value Date/Time   NA 140 05/25/2008 1036   K 4.1 05/25/2008 1036   CL 109 05/25/2008 1036   CO2 28 05/25/2008 1036   GLUCOSE 74 05/25/2008 1036   BUN 12 05/25/2008 1036   CREATININE 0.9 05/25/2008 1036   CALCIUM 10.0 03/23/2014 0000   PROT 6.7 10/28/2017 0835   ALBUMIN 4.2 10/28/2017 0835   AST 18 10/28/2017 0835   ALT 19 10/28/2017 0835   ALKPHOS 98 10/28/2017 0835   BILITOT 0.3 10/28/2017 0835   GFRNONAA 71.06 05/25/2008 1036   GFRAA 86 10/12/2007 1055       Component Value Date/Time   WBC 9.4 04/04/2011 1622   RBC 4.36 04/04/2011 1622   HGB 13.6 04/04/2011 1622   HCT 40.3 04/04/2011 1622   PLT 343.0 04/04/2011 1622   MCV 92.6 04/04/2011 1622   MCHC 33.8 04/04/2011 1622   RDW 12.5 04/04/2011 1622   LYMPHSABS 2.4 04/04/2011 1622   MONOABS 0.7 04/04/2011 1622   EOSABS 0.1 04/04/2011 1622   BASOSABS 0.1  04/04/2011 1622    No results found for: POCLITH, LITHIUM   No results found for: PHENYTOIN, PHENOBARB, VALPROATE, CBMZ   .res Assessment: Plan:   Discussed potential benefits, risks, and side effects of increasing Wellbutrin XL to 300 mg daily for depression.  Patient agrees to increase in dose. Increase Wellbutrin XL to 300 mg daily for depression. Continue Lexapro 10 mg daily for anxiety and depression. Continue Xanax 1 mg at bedtime as needed for anxiety and insomnia. Patient to follow-up in 3 months or sooner if clinically indicated. Patient advised to contact office with any questions, adverse effects, or acute worsening in signs and symptoms.  Reca was seen today for depression, insomnia and anxiety.  Diagnoses and all orders for this visit:  Depression, unspecified depression type -     buPROPion (WELLBUTRIN XL) 300 MG 24 hr tablet; Take 1 tablet (300 mg total) by mouth daily. -     escitalopram (LEXAPRO) 10 MG tablet; TAKE 1 TABLET(10 MG) BY MOUTH DAILY  Anxiety -     escitalopram (LEXAPRO) 10 MG tablet; TAKE 1 TABLET(10 MG) BY MOUTH DAILY -     ALPRAZolam (XANAX) 1 MG tablet; TAKE 1 TABLET(1 MG) BY MOUTH AT BEDTIME AS NEEDED FOR ANXIETY.  Chronic insomnia -     ALPRAZolam (XANAX) 1 MG tablet; TAKE 1 TABLET(1 MG) BY MOUTH AT BEDTIME AS NEEDED FOR ANXIETY.     Please see After Visit Summary for patient specific instructions.  Future Appointments  Date Time Provider [place]  05/31/2019  1:30 PM Dohmeier, Asencion Partridge, MD [facility] None  08/10/2019 11:00 AM Thayer Headings, PMHNP [facility] None    No orders of the defined types were placed in this encounter.   -------------------------------

## 2019-05-31 ENCOUNTER — Institutional Professional Consult (permissible substitution): Payer: BC Managed Care – PPO | Admitting: Neurology

## 2019-06-08 ENCOUNTER — Other Ambulatory Visit: Payer: Self-pay

## 2019-06-08 MED ORDER — NEXLETOL 180 MG PO TABS: 180.0000 mg | ORAL_TABLET | Freq: Every day | ORAL | 0 refills | Status: DC

## 2019-07-06 ENCOUNTER — Other Ambulatory Visit: Payer: Self-pay

## 2019-07-06 MED ORDER — NEXLETOL 180 MG PO TABS: 180.0000 mg | ORAL_TABLET | Freq: Every day | ORAL | 0 refills | Status: DC

## 2019-08-05 NOTE — Progress Notes (Signed)
Cardiology Office Note    Date:  08/10/2019   ID:  Amanda [name], DOB August 11, 1960, MRN [id number]  PCP:  Antony Contras, MD  Cardiologist: Lauree Chandler, MD EPS: None  No chief complaint on file.   History of Present Illness:  Amanda [name] is a 59 y.o. female with history of HTN, HLD, palpitations-48 hr monitor rare PAC's, PVC's, didn't show for echo, on Nexletol per lipid clinic-didn't tolerate zetia due to diarrhea. Mild sleep apnea to use dental device.  Last saw Dr. Angelena Form 03/05/18 and doing well.  Patient comes in for f/u. Is vaccinated.  Walks 20 min/1 mile daily and exercise bike. Denies chest pain, palpitations, dyspnea, dizziness or presyncope.  Past Medical History:  Diagnosis Date  . Anxiety   . Asthma   . Chronic kidney disease   . Chronic kidney disease, stage 3 08/10/2019  . Hyperlipidemia   . Hypertension   . Migraine     Past Surgical History:  Procedure Laterality Date  . APPENDECTOMY    . CARPAL TUNNEL RELEASE     rt  . FOOT SURGERY Bilateral    Plantar fasciitis  . OOPHORECTOMY      Current Medications: Current Meds  Medication Sig  . ALPRAZolam (XANAX) 1 MG tablet TAKE 1 TABLET(1 MG) BY MOUTH AT BEDTIME AS NEEDED FOR ANXIETY.  . Bempedoic Acid (NEXLETOL) 180 MG TABS Take 180 mg by mouth daily. Please make overdue appt with Dr. Angelena Form before anymore refills. 3rd and Final Attempt  . buPROPion (WELLBUTRIN XL) 150 MG 24 hr tablet Take 1 tablet (150 mg total) by mouth daily.  . Cholecalciferol (VITAMIN D3) 1.25 MG (50000 UT) CAPS Take 1 capsule by mouth once a week.  . escitalopram (LEXAPRO) 10 MG tablet Take 1.5 tablets (15 mg total) by mouth daily.  . hydrochlorothiazide (MICROZIDE) 12.5 MG capsule Take 12.5 mg by mouth daily.  . metoprolol tartrate (LOPRESSOR) 25 MG tablet Take 1 tablet by mouth 2 (two) times daily.  . rizatriptan (MAXALT) 10 MG tablet rizatriptan 10 mg tablet  . vitamin B-12 (CYANOCOBALAMIN) 1000 MCG tablet Take  1,000 mcg by mouth.     Allergies:   Ace inhibitors, Amoxicillin, Codeine, Cortisone, Hydrocodone-acetaminophen, Losartan potassium, Statins, and Zetia [ezetimibe]   Social History   Socioeconomic History  . Marital status: Married    Spouse name: Not on file  . Number of children: 1  . Years of education: Not on file  . Highest education level: Not on file  Occupational History  . Occupation: Aeronautical engineer: [employer]  Tobacco Use  . Smoking status: Former Smoker    Packs/day: 0.50    Years: 5.00    Pack years: 2.50    Types: Cigarettes    Quit date: 01/29/1987    Years since quitting: 32.5  . Smokeless tobacco: Never Used  Substance and Sexual Activity  . Alcohol use: Yes    Comment: social ETOH only  . Drug use: No  . Sexual activity: Not on file  Other Topics Concern  . Not on file  Social History Narrative  . Not on file   Social Determinants of Health   Financial Resource Strain:   . Difficulty of Paying Living Expenses:   Food Insecurity:   . Worried About Charity fundraiser in the Last Year:   . Arboriculturist in the Last Year:   Transportation Needs:   . Film/video editor (Medical):   Marland Kitchen Lack  of Transportation (Non-Medical):   Physical Activity:   . Days of Exercise per Week:   . Minutes of Exercise per Session:   Stress:   . Feeling of Stress :   Social Connections:   . Frequency of Communication with Friends and Family:   . Frequency of Social Gatherings with Friends and Family:   . Attends Religious Services:   . Active Member of Clubs or Organizations:   . Attends Archivist Meetings:   Marland Kitchen Marital Status:      Family History:  The patient's family history includes Asthma in her father, paternal aunt, and sister; Bipolar disorder in her sister; Depression in her sister and sister; Elevated Lipids in her father, mother, sister, sister, and sister; Heart disease in her father; Hypertension in her sister, sister, and  sister.   ROS:   Please see the history of present illness.    ROS All other systems reviewed and are negative.   PHYSICAL EXAM:   VS:  BP 110/66   Pulse 66   Ht 5\' 2"  (1.575 m)   Wt 144 lb (65.3 kg)   LMP 03/23/2011   BMI 26.34 kg/m   Physical Exam  GEN: Well nourished, well developed, in no acute distress  Neck: no JVD, carotid bruits, or masses Cardiac:RRR; no murmurs, rubs, or gallops  Respiratory:  clear to auscultation bilaterally, normal work of breathing GI: soft, nontender, nondistended, + BS Ext: without cyanosis, clubbing, or edema, Good distal pulses bilaterally Neuro:  Alert and Oriented x 3 Psych: euthymic mood, full affect  Wt Readings from Last 3 Encounters:  08/10/19 144 lb (65.3 kg)  03/05/18 154 lb 1.9 oz (69.9 kg)  02/11/18 152 lb (68.9 kg)      Studies/Labs Reviewed:   EKG:  EKG is  ordered today.  The ekg ordered today demonstrates NSR with low voltage, no change Recent Labs: No results found for requested labs within last 8760 hours.   Lipid Panel    Component Value Date/Time   CHOL 194 10/28/2017 0835   TRIG 183 (H) 10/28/2017 0835   HDL 44 10/28/2017 0835   CHOLHDL 4.4 10/28/2017 0835   CHOLHDL 4 05/25/2008 1036   VLDL 13.8 05/25/2008 1036   LDLCALC 113 (H) 10/28/2017 0835   LDLDIRECT 121.7 07/02/2007 0906    Additional studies/ records that were reviewed today include:   Holter monitor 2019 Study Highlights  Sinus rhythm Rare premature atrial contractions One three beat run of supraventricular tachycardia One premature ventricular contraction     ASSESSMENT:    1. Essential hypertension   2. Hyperlipidemia, unspecified hyperlipidemia type   3. Palpitations      PLAN:  In order of problems listed above:  HTN on HCTZ and metoprolol will check labs  HLD on nexletol-LDL 94 03/2019 check fasting lipid panel labs today  Palpitations-rare PAC's/PVC's controlled on metoprolol     Medication Adjustments/Labs and Tests  Ordered: Current medicines are reviewed at length with the patient today.  Concerns regarding medicines are outlined above.  Medication changes, Labs and Tests ordered today are listed in the Patient Instructions below. Patient Instructions  Medication Instructions:  Your physician recommends that you continue on your current medications as directed. Please refer to the Current Medication list given to you today.  *If you need a refill on your cardiac medications before your next appointment, please call your pharmacy*   Lab Work: Lipids, CMET Today  If you have labs (blood work) drawn today and your  tests are completely normal, you will receive your results only by: Marland Kitchen MyChart Message (if you have MyChart) OR . A paper copy in the mail If you have any lab test that is abnormal or we need to change your treatment, we will call you to review the results.   Testing/Procedures: None   Follow-Up: At [facility], you and your health needs are our priority.  As part of our continuing mission to provide you with exceptional heart care, we have created designated Provider Care Teams.  These Care Teams include your primary Cardiologist (physician) and Advanced Practice Providers (APPs -  Physician Assistants and Nurse Practitioners) who all work together to provide you with the care you need, when you need it.  We recommend signing up for the patient portal called "MyChart".  Sign up information is provided on this After Visit Summary.  MyChart is used to connect with patients for Virtual Visits (Telemedicine).  Patients are able to view lab/test results, encounter notes, upcoming appointments, etc.  Non-urgent messages can be sent to your provider as well.   To learn more about what you can do with MyChart, go to [email or web address].    Your next appointment:   12 month(s)  The format for your next appointment:   In Person  Provider:   You may see Lauree Chandler, MD or one of  the following Advanced Practice Providers on your designated Care Team:    Melina Copa, PA-C  Ermalinda Barrios, PA-C    Other Instructions None     Signed, Ermalinda Barrios, PA-C  08/10/2019 12:53 PM    [address], [place], [place]  [address] Phone: [phone]; Fax: [phone]

## 2019-08-10 ENCOUNTER — Other Ambulatory Visit: Payer: Self-pay

## 2019-08-10 ENCOUNTER — Ambulatory Visit (INDEPENDENT_AMBULATORY_CARE_PROVIDER_SITE_OTHER): Payer: BC Managed Care – PPO | Admitting: Psychiatry

## 2019-08-10 ENCOUNTER — Encounter: Payer: Self-pay | Admitting: Psychiatry

## 2019-08-10 ENCOUNTER — Encounter: Payer: Self-pay | Admitting: Physician Assistant

## 2019-08-10 ENCOUNTER — Ambulatory Visit (INDEPENDENT_AMBULATORY_CARE_PROVIDER_SITE_OTHER): Payer: BC Managed Care – PPO | Admitting: Physician Assistant

## 2019-08-10 VITALS — BP 110/66 | HR 66 | Ht 62.0 in | Wt 144.0 lb

## 2019-08-10 DIAGNOSIS — F32A Depression, unspecified: Secondary | ICD-10-CM

## 2019-08-10 DIAGNOSIS — F329 Major depressive disorder, single episode, unspecified: Secondary | ICD-10-CM | POA: Diagnosis not present

## 2019-08-10 DIAGNOSIS — I10 Essential (primary) hypertension: Secondary | ICD-10-CM | POA: Diagnosis not present

## 2019-08-10 DIAGNOSIS — F5104 Psychophysiologic insomnia: Secondary | ICD-10-CM | POA: Diagnosis not present

## 2019-08-10 DIAGNOSIS — F419 Anxiety disorder, unspecified: Secondary | ICD-10-CM

## 2019-08-10 DIAGNOSIS — E785 Hyperlipidemia, unspecified: Secondary | ICD-10-CM | POA: Diagnosis not present

## 2019-08-10 DIAGNOSIS — R002 Palpitations: Secondary | ICD-10-CM | POA: Diagnosis not present

## 2019-08-10 DIAGNOSIS — N183 Chronic kidney disease, stage 3 unspecified: Secondary | ICD-10-CM

## 2019-08-10 HISTORY — DX: Chronic kidney disease, stage 3 unspecified: N18.30

## 2019-08-10 MED ORDER — BUPROPION HCL ER (XL) 150 MG PO TB24: 150.0000 mg | ORAL_TABLET | Freq: Every day | ORAL | 0 refills | Status: DC

## 2019-08-10 MED ORDER — NEXLETOL 180 MG PO TABS: 180.0000 mg | ORAL_TABLET | Freq: Every day | ORAL | 3 refills | Status: DC

## 2019-08-10 MED ORDER — ESCITALOPRAM OXALATE 10 MG PO TABS: 15.0000 mg | ORAL_TABLET | Freq: Every day | ORAL | 0 refills | Status: DC

## 2019-08-10 MED ORDER — ALPRAZOLAM 1 MG PO TABS: ORAL_TABLET | ORAL | 3 refills | Status: DC

## 2019-08-10 NOTE — Addendum Note (Signed)
Addended by: Carylon Perches on: 08/10/2019 01:00 PM   Modules accepted: Orders

## 2019-08-10 NOTE — Patient Instructions (Signed)
Medication Instructions:  Your physician recommends that you continue on your current medications as directed. Please refer to the Current Medication list given to you today.  *If you need a refill on your cardiac medications before your next appointment, please call your pharmacy*   Lab Work: Lipids, CMET Today  If you have labs (blood work) drawn today and your tests are completely normal, you will receive your results only by: Marland Kitchen MyChart Message (if you have MyChart) OR . A paper copy in the mail If you have any lab test that is abnormal or we need to change your treatment, we will call you to review the results.   Testing/Procedures: None   Follow-Up: At [facility], you and your health needs are our priority.  As part of our continuing mission to provide you with exceptional heart care, we have created designated Provider Care Teams.  These Care Teams include your primary Cardiologist (physician) and Advanced Practice Providers (APPs -  Physician Assistants and Nurse Practitioners) who all work together to provide you with the care you need, when you need it.  We recommend signing up for the patient portal called "MyChart".  Sign up information is provided on this After Visit Summary.  MyChart is used to connect with patients for Virtual Visits (Telemedicine).  Patients are able to view lab/test results, encounter notes, upcoming appointments, etc.  Non-urgent messages can be sent to your provider as well.   To learn more about what you can do with MyChart, go to [email or web address].    Your next appointment:   12 month(s)  The format for your next appointment:   In Person  Provider:   You may see Lauree Chandler, MD or one of the following Advanced Practice Providers on your designated Care Team:    Melina Copa, PA-C  Ermalinda Barrios, PA-C    Other Instructions None

## 2019-08-10 NOTE — Addendum Note (Signed)
Addended by: Carylon Perches on: 08/10/2019 12:55 PM   Modules accepted: Orders

## 2019-08-10 NOTE — Progress Notes (Signed)
MERARI PION [id number] 10-19-1960 59 y.o.  Subjective:   Patient ID:  Amanda [name] is a 58 y.o. (DOB 07/12/1960) female.  Chief Complaint:  Chief Complaint  Patient presents with  . Depression  . Follow-up    h/o anxiety and insomnia    HPI Amanda [name] presents to the office today for follow-up of depression and anxiety. She reports, "I still struggle with motivation." She noticed some slight improvement in motivation after increase in Wellbutrin XL. She reports that her energy is also. Reports that her mood has been persistently depressed. Denies irritability and reports that this is improved compared to the past. She reports that her anxiety has been ok overall. Had episode of acute anxiety over the 4th of July when around "toxic" family members. Reports that she has anticipatory anxiety before gatherings with certain family members and remains anxious after being around them. She reports that she limits contact with these family members. She reports that her sleep is "not great." Sometimes goes to bed later, 2-3 am, and then sleeps later the next day because she does not feel like getting up. Describes inconsistent sleep schedule. She reports that her appetite has been decreased and has unintentionally lost weight. Has not been cooking like usual. No change in concentration. She enjoys trading stocks and reading about the [employer]. Enjoying going to daughter's softball practices and games. Denies SI.      Past Psychiatric Medication Trials: Wellbutrin Trintellix Lexapro- "felt the best on." Reports that she had some hair loss Zoloft Celexa Viibryd Prozac Effexor Remeron Ambien- Ineffective and caused HA Belsomra Doxepin Klonopin xanax Gabapentin Abilify  Review of Systems:  Review of Systems  Musculoskeletal: Negative for gait problem.  Neurological: Negative for tremors.  Psychiatric/Behavioral:       Please refer to HPI   Denies any abnormal EKG's in the  past.  Has apt today to have cholesterol levels checked.   Medications: I have reviewed the patient's current medications.  Current Outpatient Medications  Medication Sig Dispense Refill  . ALPRAZolam (XANAX) 1 MG tablet TAKE 1 TABLET(1 MG) BY MOUTH AT BEDTIME AS NEEDED FOR ANXIETY. 30 tablet 3  . Bempedoic Acid (NEXLETOL) 180 MG TABS Take 180 mg by mouth daily. Please make overdue appt with Dr. Angelena Form before anymore refills. 3rd and Final Attempt 15 tablet 0  . Cholecalciferol (VITAMIN D3) 1.25 MG (50000 UT) CAPS Take 1 capsule by mouth once a week.    . escitalopram (LEXAPRO) 10 MG tablet Take 1.5 tablets (15 mg total) by mouth daily. 90 tablet 0  . hydrochlorothiazide (MICROZIDE) 12.5 MG capsule Take 12.5 mg by mouth daily.    . metoprolol tartrate (LOPRESSOR) 25 MG tablet Take 1 tablet by mouth 2 (two) times daily.    . rizatriptan (MAXALT) 10 MG tablet rizatriptan 10 mg tablet    . vitamin B-12 (CYANOCOBALAMIN) 1000 MCG tablet Take 1,000 mcg by mouth.    Amanda [name] Kitchen buPROPion (WELLBUTRIN XL) 150 MG 24 hr tablet Take 1 tablet (150 mg total) by mouth daily. 90 tablet 0   No current facility-administered medications for this visit.    Medication Side Effects: None  Allergies:  Allergies  Allergen Reactions  . Ace Inhibitors     REACTION: cough  . Amoxicillin     Gi upset  . Codeine Nausea And Vomiting    Can tolerate hydrocodone if has food on stomach Can tolerate hydrocodone if has food on stomach   . Cortisone  Extremely high BP  . Hydrocodone-Acetaminophen     REACTION: vomitting  . Losartan Potassium     REACTION: cough  . Statins     Face and leg rash with rosuvastatin, pravastatin, and atorvastatin  . Zetia [Ezetimibe] Diarrhea    Past Medical History:  Diagnosis Date  . Anxiety   . Asthma   . Chronic kidney disease   . Chronic kidney disease, stage 3 08/10/2019  . Hyperlipidemia   . Hypertension   . Migraine     Family History  Problem Relation Age of Onset   . Asthma Father   . Heart disease Father        CAD s/p CABG  . Elevated Lipids Father   . Asthma Sister   . Elevated Lipids Sister   . Hypertension Sister   . Depression Sister   . Asthma Paternal Aunt   . Elevated Lipids Mother   . Elevated Lipids Sister   . Hypertension Sister   . Bipolar disorder Sister   . Elevated Lipids Sister   . Hypertension Sister   . Depression Sister     Social History   Socioeconomic History  . Marital status: Married    Spouse name: Not on file  . Number of children: 1  . Years of education: Not on file  . Highest education level: Not on file  Occupational History  . Occupation: Aeronautical engineer: [employer]  Tobacco Use  . Smoking status: Former Smoker    Packs/day: 0.50    Years: 5.00    Pack years: 2.50    Types: Cigarettes    Quit date: 01/29/1987    Years since quitting: 32.5  . Smokeless tobacco: Never Used  Substance and Sexual Activity  . Alcohol use: Yes    Comment: social ETOH only  . Drug use: No  . Sexual activity: Not on file  Other Topics Concern  . Not on file  Social History Narrative  . Not on file   Social Determinants of Health   Financial Resource Strain:   . Difficulty of Paying Living Expenses:   Food Insecurity:   . Worried About Charity fundraiser in the Last Year:   . Arboriculturist in the Last Year:   Transportation Needs:   . Film/video editor (Medical):   Amanda [name] Kitchen Lack of Transportation (Non-Medical):   Physical Activity:   . Days of Exercise per Week:   . Minutes of Exercise per Session:   Stress:   . Feeling of Stress :   Social Connections:   . Frequency of Communication with Friends and Family:   . Frequency of Social Gatherings with Friends and Family:   . Attends Religious Services:   . Active Member of Clubs or Organizations:   . Attends Archivist Meetings:   Amanda [name] Kitchen Marital Status:   Intimate Partner Violence:   . Fear of Current or Ex-Partner:   . Emotionally  Abused:   Amanda [name] Kitchen Physically Abused:   . Sexually Abused:     Past Medical History, Surgical history, Social history, and Family history were reviewed and updated as appropriate.   Please see review of systems for further details on the patient's review from today.   Objective:   Physical Exam:  BP 127/75   Pulse 66   LMP 03/23/2011   Physical Exam Constitutional:      General: She is not in acute distress. Musculoskeletal:        General:  No deformity.  Neurological:     Mental Status: She is alert and oriented to person, place, and time.     Coordination: Coordination normal.  Psychiatric:        Attention and Perception: Attention and perception normal. She does not perceive auditory or visual hallucinations.        Mood and Affect: Mood is depressed. Mood is not anxious. Affect is not labile, blunt, angry or inappropriate.        Speech: Speech normal.        Behavior: Behavior normal.        Thought Content: Thought content normal. Thought content is not paranoid or delusional. Thought content does not include homicidal or suicidal ideation. Thought content does not include homicidal or suicidal plan.        Cognition and Memory: Cognition and memory normal.        Judgment: Judgment normal.     Comments: Insight intact     Lab Review:     Component Value Date/Time   NA 140 05/25/2008 1036   K 4.1 05/25/2008 1036   CL 109 05/25/2008 1036   CO2 28 05/25/2008 1036   GLUCOSE 74 05/25/2008 1036   BUN 12 05/25/2008 1036   CREATININE 0.9 05/25/2008 1036   CALCIUM 10.0 03/23/2014 0000   PROT 6.7 10/28/2017 0835   ALBUMIN 4.2 10/28/2017 0835   AST 18 10/28/2017 0835   ALT 19 10/28/2017 0835   ALKPHOS 98 10/28/2017 0835   BILITOT 0.3 10/28/2017 0835   GFRNONAA 71.06 05/25/2008 1036   GFRAA 86 10/12/2007 1055       Component Value Date/Time   WBC 9.4 04/04/2011 1622   RBC 4.36 04/04/2011 1622   HGB 13.6 04/04/2011 1622   HCT 40.3 04/04/2011 1622   PLT 343.0  04/04/2011 1622   MCV 92.6 04/04/2011 1622   MCHC 33.8 04/04/2011 1622   RDW 12.5 04/04/2011 1622   LYMPHSABS 2.4 04/04/2011 1622   MONOABS 0.7 04/04/2011 1622   EOSABS 0.1 04/04/2011 1622   BASOSABS 0.1 04/04/2011 1622    No results found for: POCLITH, LITHIUM   No results found for: PHENYTOIN, PHENOBARB, VALPROATE, CBMZ   .res Assessment: Plan:   Will decrease Wellbutrin XL to 150 mg po qd since there has not been any significant improvement in depressive s/s with increase to Wellbutrin XL 300 mg daily. Will increase Lexapro to 15 mg daily to improve depressive signs and symptoms since patient reports that Lexapro has been the most effective treatment for her mood and anxiety. Continue Xanax as needed for anxiety and insomnia. Patient to follow-up in 3 months or sooner if clinically indicated. Patient advised to contact office with any questions, adverse effects, or acute worsening in signs and symptoms.  Samaya was seen today for depression and follow-up.  Diagnoses and all orders for this visit:  Depression, unspecified depression type -     buPROPion (WELLBUTRIN XL) 150 MG 24 hr tablet; Take 1 tablet (150 mg total) by mouth daily. -     escitalopram (LEXAPRO) 10 MG tablet; Take 1.5 tablets (15 mg total) by mouth daily.  Anxiety -     escitalopram (LEXAPRO) 10 MG tablet; Take 1.5 tablets (15 mg total) by mouth daily. -     ALPRAZolam (XANAX) 1 MG tablet; TAKE 1 TABLET(1 MG) BY MOUTH AT BEDTIME AS NEEDED FOR ANXIETY.  Chronic insomnia -     ALPRAZolam (XANAX) 1 MG tablet; TAKE 1 TABLET(1 MG) BY MOUTH AT  BEDTIME AS NEEDED FOR ANXIETY.     Please see After Visit Summary for patient specific instructions.  Future Appointments  Date Time Provider [place]  08/10/2019 12:45 PM Imogene Burn, PA-C [id number]  11/10/2019 11:00 AM Thayer Headings, PMHNP [facility] None    No orders of the defined types were placed in this  encounter.   -------------------------------

## 2019-08-11 ENCOUNTER — Telehealth: Payer: Self-pay | Admitting: Physician Assistant

## 2019-08-11 LAB — COMPREHENSIVE METABOLIC PANEL
ALT: 24 IU/L (ref 0–32)
AST: 24 IU/L (ref 0–40)
Albumin/Globulin Ratio: 1.6 (ref 1.2–2.2)
Albumin: 4.6 g/dL (ref 3.8–4.9)
Alkaline Phosphatase: 75 IU/L (ref 48–121)
BUN/Creatinine Ratio: 15 (ref 9–23)
BUN: 20 mg/dL (ref 6–24)
Bilirubin Total: 0.5 mg/dL (ref 0.0–1.2)
CO2: 24 mmol/L (ref 20–29)
Calcium: 10.3 mg/dL — ABNORMAL HIGH (ref 8.7–10.2)
Chloride: 100 mmol/L (ref 96–106)
Creatinine, Ser: 1.34 mg/dL — ABNORMAL HIGH (ref 0.57–1.00)
GFR calc Af Amer: 50 mL/min/{1.73_m2} — ABNORMAL LOW (ref >59)
GFR calc non Af Amer: 43 mL/min/{1.73_m2} — ABNORMAL LOW (ref >59)
Globulin, Total: 2.8 g/dL (ref 1.5–4.5)
Glucose: 85 mg/dL (ref 65–99)
Potassium: 4.1 mmol/L (ref 3.5–5.2)
Sodium: 138 mmol/L (ref 134–144)
Total Protein: 7.4 g/dL (ref 6.0–8.5)

## 2019-08-11 LAB — LIPID PANEL
Chol/HDL Ratio: 4.3 ratio (ref 0.0–4.4)
Cholesterol, Total: 171 mg/dL (ref 100–199)
HDL: 40 mg/dL (ref >39)
LDL Chol Calc (NIH): 102 mg/dL — ABNORMAL HIGH (ref 0–99)
Triglycerides: 163 mg/dL — ABNORMAL HIGH (ref 0–149)
VLDL Cholesterol Cal: 29 mg/dL (ref 5–40)

## 2019-08-11 NOTE — Telephone Encounter (Signed)
New message ° ° °Patient is returning call for lab results. Please call. °

## 2019-08-11 NOTE — Telephone Encounter (Signed)
Amanda Burn, PA-C  08/11/2019 7:48 AM EDT Back to Top    Kidney function up to 1.34. would stop HCTZ and monitor BP. Make sure she has a cuff. Keep daily log for 2 weeks and call us with results. LDL up to 102 and triglycerides high 163. Reduce processed foods and simple sugars in diet. Consider calcium score once price is reduced. thanks   Patient notified.  She has BP cuff at home.

## 2019-08-30 ENCOUNTER — Telehealth: Payer: Self-pay

## 2019-08-30 ENCOUNTER — Other Ambulatory Visit: Payer: Self-pay

## 2019-08-30 DIAGNOSIS — I10 Essential (primary) hypertension: Secondary | ICD-10-CM

## 2019-08-30 DIAGNOSIS — N183 Chronic kidney disease, stage 3 unspecified: Secondary | ICD-10-CM

## 2019-08-30 NOTE — Telephone Encounter (Signed)
I called pt to advise her (per last message) to stay off of her HCTZ. Made appt for her to have BMET on 09/02/2019 to check her kidney function.

## 2019-09-02 ENCOUNTER — Other Ambulatory Visit: Payer: BC Managed Care – PPO | Admitting: *Deleted

## 2019-09-02 ENCOUNTER — Other Ambulatory Visit: Payer: Self-pay

## 2019-09-02 DIAGNOSIS — I10 Essential (primary) hypertension: Secondary | ICD-10-CM | POA: Diagnosis not present

## 2019-09-02 DIAGNOSIS — N183 Chronic kidney disease, stage 3 unspecified: Secondary | ICD-10-CM

## 2019-09-02 LAB — BASIC METABOLIC PANEL
BUN/Creatinine Ratio: 13 (ref 9–23)
BUN: 16 mg/dL (ref 6–24)
CO2: 23 mmol/L (ref 20–29)
Calcium: 10.1 mg/dL (ref 8.7–10.2)
Chloride: 101 mmol/L (ref 96–106)
Creatinine, Ser: 1.25 mg/dL — ABNORMAL HIGH (ref 0.57–1.00)
GFR calc Af Amer: 54 mL/min/{1.73_m2} — ABNORMAL LOW (ref >59)
GFR calc non Af Amer: 47 mL/min/{1.73_m2} — ABNORMAL LOW (ref >59)
Glucose: 92 mg/dL (ref 65–99)
Potassium: 3.7 mmol/L (ref 3.5–5.2)
Sodium: 137 mmol/L (ref 134–144)

## 2019-09-03 ENCOUNTER — Other Ambulatory Visit: Payer: Self-pay

## 2019-09-03 DIAGNOSIS — N183 Chronic kidney disease, stage 3 unspecified: Secondary | ICD-10-CM

## 2019-09-07 ENCOUNTER — Telehealth: Payer: Self-pay

## 2019-09-07 NOTE — Telephone Encounter (Signed)
Spoke to pt and advised her to contact her PCP for urination issues. She stated that she would contact them to f/u on this.

## 2019-09-07 NOTE — Telephone Encounter (Signed)
Spoke to pt. She will contact her PCP for this matter.

## 2019-09-08 DIAGNOSIS — R350 Frequency of micturition: Secondary | ICD-10-CM | POA: Diagnosis not present

## 2019-09-13 ENCOUNTER — Ambulatory Visit: Payer: BC Managed Care – PPO | Admitting: Cardiovascular Disease

## 2019-10-25 DIAGNOSIS — D473 Essential (hemorrhagic) thrombocythemia: Secondary | ICD-10-CM | POA: Diagnosis not present

## 2019-10-25 DIAGNOSIS — N1832 Chronic kidney disease, stage 3b: Secondary | ICD-10-CM | POA: Diagnosis not present

## 2019-10-25 DIAGNOSIS — E782 Mixed hyperlipidemia: Secondary | ICD-10-CM | POA: Diagnosis not present

## 2019-10-25 DIAGNOSIS — E559 Vitamin D deficiency, unspecified: Secondary | ICD-10-CM | POA: Diagnosis not present

## 2019-10-25 DIAGNOSIS — G43909 Migraine, unspecified, not intractable, without status migrainosus: Secondary | ICD-10-CM | POA: Diagnosis not present

## 2019-10-25 DIAGNOSIS — I129 Hypertensive chronic kidney disease with stage 1 through stage 4 chronic kidney disease, or unspecified chronic kidney disease: Secondary | ICD-10-CM | POA: Diagnosis not present

## 2019-11-03 ENCOUNTER — Other Ambulatory Visit: Payer: Self-pay

## 2019-11-03 ENCOUNTER — Other Ambulatory Visit: Payer: BC Managed Care – PPO | Admitting: *Deleted

## 2019-11-03 DIAGNOSIS — N183 Chronic kidney disease, stage 3 unspecified: Secondary | ICD-10-CM | POA: Diagnosis not present

## 2019-11-03 LAB — BASIC METABOLIC PANEL
BUN/Creatinine Ratio: 10 (ref 9–23)
BUN: 17 mg/dL (ref 6–24)
CO2: 22 mmol/L (ref 20–29)
Calcium: 10.1 mg/dL (ref 8.7–10.2)
Chloride: 103 mmol/L (ref 96–106)
Creatinine, Ser: 1.68 mg/dL — ABNORMAL HIGH (ref 0.57–1.00)
GFR calc Af Amer: 38 mL/min/{1.73_m2} — ABNORMAL LOW (ref >59)
GFR calc non Af Amer: 33 mL/min/{1.73_m2} — ABNORMAL LOW (ref >59)
Glucose: 80 mg/dL (ref 65–99)
Potassium: 4.2 mmol/L (ref 3.5–5.2)
Sodium: 138 mmol/L (ref 134–144)

## 2019-11-04 ENCOUNTER — Other Ambulatory Visit: Payer: Self-pay

## 2019-11-04 DIAGNOSIS — N183 Chronic kidney disease, stage 3 unspecified: Secondary | ICD-10-CM

## 2019-11-10 ENCOUNTER — Ambulatory Visit (INDEPENDENT_AMBULATORY_CARE_PROVIDER_SITE_OTHER): Payer: BC Managed Care – PPO | Admitting: Psychiatry

## 2019-11-10 ENCOUNTER — Encounter: Payer: Self-pay | Admitting: Psychiatry

## 2019-11-10 ENCOUNTER — Other Ambulatory Visit: Payer: Self-pay

## 2019-11-10 DIAGNOSIS — F5104 Psychophysiologic insomnia: Secondary | ICD-10-CM

## 2019-11-10 DIAGNOSIS — F32A Depression, unspecified: Secondary | ICD-10-CM

## 2019-11-10 DIAGNOSIS — F419 Anxiety disorder, unspecified: Secondary | ICD-10-CM

## 2019-11-10 MED ORDER — DEPLIN 15 15-90.314 MG PO CAPS: 15.0000 mg | ORAL_CAPSULE | Freq: Every day | ORAL | 0 refills | Status: DC

## 2019-11-10 MED ORDER — ESCITALOPRAM OXALATE 20 MG PO TABS: 20.0000 mg | ORAL_TABLET | Freq: Every day | ORAL | 1 refills | Status: DC

## 2019-11-10 MED ORDER — ALPRAZOLAM 1 MG PO TABS: ORAL_TABLET | ORAL | 1 refills | Status: DC

## 2019-11-10 NOTE — Progress Notes (Signed)
Amanda [name] [id number] 09/13/1960 59 y.o.  Subjective:   Patient ID:  Amanda [name] is a 59 y.o. (DOB 1960-09-10) female.  Chief Complaint:  Chief Complaint  Patient presents with  . Depression  . Anxiety  . Sleeping Problem    HPI Amanda [name] presents to the office today for follow-up of depression, anxiety, and sleep disturbance. She reports that she has been having depression and anxiety. She reports periods of increased anxiety without reaching the level of a full blown panic attack. She reports some worry about her teenage daughter. Has had persistent sad mood. Some irritability. She reports "brain fog" and difficulty with concentration. She reports that she has had some days where she has not been able to leave the house. She reports that she had a difficult weekend this past weekend. Low energy and motivation. Has been working less. She reports frequent procrastination, which is not typical for her. She reports that she has some anxiety about her health. Has been walking daily and stopped soft drinks and has had intentional wt loss. Has stopped eating sweets. No change in appetite. Sleep has been improved compared to the past. Reports that she only slept a couple hours last night. Some difficulty with sleep initiation. Denies SI.   Has been cooking less even though she enjoys cooking.Not interested in fixing things around the house like usual.    She reports that she took Vyvanse on a few days and felt "sharp" and mood was improved.   Reports that wife is supportive. Denies any situational factors to depression or anxiety.   Past Psychiatric Medication Trials: Wellbutrin XL- No improvement at 300 mg dose. Trintellix Lexapro- "felt the best on." Reports that she had some hair loss Zoloft Celexa Viibryd Prozac Effexor Remeron Ambien- Ineffective and caused HA Belsomra Doxepin Klonopin xanax Gabapentin Abilify  Review of Systems:  Review of Systems   Cardiovascular: Negative for palpitations.       Reports h/o palpitations with panic attacks  Genitourinary:       She reports worsening renal function  Musculoskeletal: Negative for gait problem.  Neurological: Negative for tremors.  Psychiatric/Behavioral:       Please refer to HPI    Medications: I have reviewed the patient's current medications.  Current Outpatient Medications  Medication Sig Dispense Refill  . ALPRAZolam (XANAX) 1 MG tablet TAKE 1 TABLET AT BEDTIME AND 1/2-1 TABLET AS NEEDED FOR ANXIETY. 45 tablet 1  . Bempedoic Acid (NEXLETOL) 180 MG TABS Take 180 mg by mouth daily. 90 tablet 3  . Cholecalciferol (VITAMIN D3) 1.25 MG (50000 UT) CAPS Take 1 capsule by mouth once a week.    . escitalopram (LEXAPRO) 20 MG tablet Take 1 tablet (20 mg total) by mouth daily. 90 tablet 1  . metoprolol tartrate (LOPRESSOR) 25 MG tablet Take 1 tablet by mouth 2 (two) times daily.    . vitamin B-12 (CYANOCOBALAMIN) 1000 MCG tablet Take 1,000 mcg by mouth.    Marland Kitchen L-Methylfolate-Algae (DEPLIN 15) 15-90.314 MG CAPS Take 15 mg by mouth daily. 28 capsule 0  . rizatriptan (MAXALT) 10 MG tablet rizatriptan 10 mg tablet     No current facility-administered medications for this visit.    Medication Side Effects: None  Allergies:  Allergies  Allergen Reactions  . Ace Inhibitors     REACTION: cough  . Amoxicillin     Gi upset  . Codeine Nausea And Vomiting    Can tolerate hydrocodone if has food on stomach Can  tolerate hydrocodone if has food on stomach   . Cortisone     Extremely high BP  . Hydrocodone-Acetaminophen     REACTION: vomitting  . Losartan Potassium     REACTION: cough  . Statins     Face and leg rash with rosuvastatin, pravastatin, and atorvastatin  . Zetia [Ezetimibe] Diarrhea    Past Medical History:  Diagnosis Date  . Anxiety   . Asthma   . Chronic kidney disease   . Chronic kidney disease, stage 3 ([place]) 08/10/2019  . Hyperlipidemia   . Hypertension   .  Migraine     Family History  Problem Relation Age of Onset  . Asthma Father   . Heart disease Father        CAD s/p CABG  . Elevated Lipids Father   . Depression Father   . Asthma Sister   . Elevated Lipids Sister   . Hypertension Sister   . Depression Sister   . Asthma Paternal Aunt   . Elevated Lipids Mother   . Elevated Lipids Sister   . Hypertension Sister   . Bipolar disorder Sister   . Elevated Lipids Sister   . Hypertension Sister   . Depression Sister     Social History   Socioeconomic History  . Marital status: Married    Spouse name: Not on file  . Number of children: 1  . Years of education: Not on file  . Highest education level: Not on file  Occupational History  . Occupation: Aeronautical engineer: [employer]  Tobacco Use  . Smoking status: Former Smoker    Packs/day: 0.50    Years: 5.00    Pack years: 2.50    Types: Cigarettes    Quit date: 01/29/1987    Years since quitting: 32.8  . Smokeless tobacco: Never Used  Substance and Sexual Activity  . Alcohol use: Yes    Comment: social ETOH only  . Drug use: No  . Sexual activity: Not on file  Other Topics Concern  . Not on file  Social History Narrative  . Not on file   Social Determinants of Health   Financial Resource Strain:   . Difficulty of Paying Living Expenses: Not on file  Food Insecurity:   . Worried About Charity fundraiser in the Last Year: Not on file  . Ran Out of Food in the Last Year: Not on file  Transportation Needs:   . Lack of Transportation (Medical): Not on file  . Lack of Transportation (Non-Medical): Not on file  Physical Activity:   . Days of Exercise per Week: Not on file  . Minutes of Exercise per Session: Not on file  Stress:   . Feeling of Stress : Not on file  Social Connections:   . Frequency of Communication with Friends and Family: Not on file  . Frequency of Social Gatherings with Friends and Family: Not on file  . Attends Religious Services:  Not on file  . Active Member of Clubs or Organizations: Not on file  . Attends Archivist Meetings: Not on file  . Marital Status: Not on file  Intimate Partner Violence:   . Fear of Current or Ex-Partner: Not on file  . Emotionally Abused: Not on file  . Physically Abused: Not on file  . Sexually Abused: Not on file    Past Medical History, Surgical history, Social history, and Family history were reviewed and updated as appropriate.   Please  see review of systems for further details on the patient's review from today.   Objective:   Physical Exam:  LMP 03/23/2011   Physical Exam Constitutional:      General: She is not in acute distress. Musculoskeletal:        General: No deformity.  Neurological:     Mental Status: She is alert and oriented to person, place, and time.     Coordination: Coordination normal.  Psychiatric:        Attention and Perception: Attention and perception normal. She does not perceive auditory or visual hallucinations.        Mood and Affect: Mood is anxious and depressed. Affect is not labile, blunt, angry or inappropriate.        Speech: Speech normal.        Behavior: Behavior normal.        Thought Content: Thought content normal. Thought content is not paranoid or delusional. Thought content does not include homicidal or suicidal ideation. Thought content does not include homicidal or suicidal plan.        Cognition and Memory: Cognition and memory normal.        Judgment: Judgment normal.     Comments: Insight intact     Lab Review:     Component Value Date/Time   NA 138 11/03/2019 1107   K 4.2 11/03/2019 1107   CL 103 11/03/2019 1107   CO2 22 11/03/2019 1107   GLUCOSE 80 11/03/2019 1107   GLUCOSE 74 05/25/2008 1036   BUN 17 11/03/2019 1107   CREATININE 1.68 (H) 11/03/2019 1107   CALCIUM 10.1 11/03/2019 1107   PROT 7.4 08/10/2019 1300   ALBUMIN 4.6 08/10/2019 1300   AST 24 08/10/2019 1300   ALT 24 08/10/2019 1300    ALKPHOS 75 08/10/2019 1300   BILITOT 0.5 08/10/2019 1300   GFRNONAA 33 (L) 11/03/2019 1107   GFRAA 38 (L) 11/03/2019 1107       Component Value Date/Time   WBC 9.4 04/04/2011 1622   RBC 4.36 04/04/2011 1622   HGB 13.6 04/04/2011 1622   HCT 40.3 04/04/2011 1622   PLT 343.0 04/04/2011 1622   MCV 92.6 04/04/2011 1622   MCHC 33.8 04/04/2011 1622   RDW 12.5 04/04/2011 1622   LYMPHSABS 2.4 04/04/2011 1622   MONOABS 0.7 04/04/2011 1622   EOSABS 0.1 04/04/2011 1622   BASOSABS 0.1 04/04/2011 1622    No results found for: POCLITH, LITHIUM   No results found for: PHENYTOIN, PHENOBARB, VALPROATE, CBMZ   .res Assessment: Plan:   Discussed potential benefits, risks, and side effects of increasing Lexapro to 20 mg daily to improve mood and anxiety.  Patient agrees to increase in Lexapro.  Discussed considering changing to another medication if signs and symptoms do not improve with highest dose of Lexapro. Will discontinue Wellbutrin XL 150 mg daily since patient denies any significant improvement with Wellbutrin. Will continue l-methylfolate 15 mg daily since patient reports possible positive response. Patient to follow-up in 4 to 6 weeks or sooner if clinically indicated. Patient advised to contact office with any questions, adverse effects, or acute worsening in signs and symptoms.  Antonio was seen today for depression, anxiety and sleeping problem.  Diagnoses and all orders for this visit:  Depression, unspecified depression type -     escitalopram (LEXAPRO) 20 MG tablet; Take 1 tablet (20 mg total) by mouth daily. -     L-Methylfolate-Algae (DEPLIN 15) 15-90.314 MG CAPS; Take 15 mg by mouth daily.  Anxiety -     escitalopram (LEXAPRO) 20 MG tablet; Take 1 tablet (20 mg total) by mouth daily. -     ALPRAZolam (XANAX) 1 MG tablet; TAKE 1 TABLET AT BEDTIME AND 1/2-1 TABLET AS NEEDED FOR ANXIETY.  Chronic insomnia -     ALPRAZolam (XANAX) 1 MG tablet; TAKE 1 TABLET AT BEDTIME AND  1/2-1 TABLET AS NEEDED FOR ANXIETY.     Please see After Visit Summary for patient specific instructions.  Future Appointments  Date Time Provider [place]  12/15/2019  9:00 AM Thayer Headings, PMHNP [facility] None    No orders of the defined types were placed in this encounter.   -------------------------------

## 2019-11-19 NOTE — Telephone Encounter (Signed)
Pt is returning call.  

## 2019-11-19 NOTE — Telephone Encounter (Signed)
Received unread message note.  I placed call to patient to follow up and make sure she was aware referral had been placed.  Left message to call office

## 2019-11-19 NOTE — Telephone Encounter (Signed)
I spoke with patient. She was aware of referral and has appointment next week.

## 2019-11-25 DIAGNOSIS — N183 Chronic kidney disease, stage 3 unspecified: Secondary | ICD-10-CM | POA: Diagnosis not present

## 2019-11-25 DIAGNOSIS — I129 Hypertensive chronic kidney disease with stage 1 through stage 4 chronic kidney disease, or unspecified chronic kidney disease: Secondary | ICD-10-CM | POA: Diagnosis not present

## 2019-11-25 DIAGNOSIS — E785 Hyperlipidemia, unspecified: Secondary | ICD-10-CM | POA: Diagnosis not present

## 2019-11-26 ENCOUNTER — Other Ambulatory Visit: Payer: Self-pay | Admitting: Internal Medicine

## 2019-11-26 DIAGNOSIS — I129 Hypertensive chronic kidney disease with stage 1 through stage 4 chronic kidney disease, or unspecified chronic kidney disease: Secondary | ICD-10-CM

## 2019-11-26 DIAGNOSIS — N183 Chronic kidney disease, stage 3 unspecified: Secondary | ICD-10-CM

## 2019-12-01 ENCOUNTER — Ambulatory Visit
Admission: RE | Admit: 2019-12-01 | Discharge: 2019-12-01 | Disposition: A | Discharge status: Home / Self Care | Payer: BC Managed Care – PPO | Source: Ambulatory Visit | Attending: Internal Medicine | Admitting: Internal Medicine

## 2019-12-01 ENCOUNTER — Other Ambulatory Visit: Payer: BC Managed Care – PPO

## 2019-12-01 DIAGNOSIS — N183 Chronic kidney disease, stage 3 unspecified: Secondary | ICD-10-CM | POA: Diagnosis not present

## 2019-12-01 DIAGNOSIS — I129 Hypertensive chronic kidney disease with stage 1 through stage 4 chronic kidney disease, or unspecified chronic kidney disease: Secondary | ICD-10-CM

## 2019-12-03 ENCOUNTER — Telehealth: Payer: Self-pay | Admitting: Psychiatry

## 2019-12-03 DIAGNOSIS — F32A Depression, unspecified: Secondary | ICD-10-CM

## 2019-12-03 MED ORDER — DEPLIN 15 15-90.314 MG PO CAPS: 15.0000 mg | ORAL_CAPSULE | Freq: Every day | ORAL | 5 refills | Status: DC

## 2019-12-03 NOTE — Addendum Note (Signed)
Addended by: Sharyl Nimrod on: 12/03/2019 03:34 PM   Modules accepted: Orders

## 2019-12-03 NOTE — Telephone Encounter (Signed)
Pt called to report  Deplin samples are working and requesting a Rx to [facility]. Next apt 11/17

## 2019-12-03 NOTE — Telephone Encounter (Signed)
Reviewed thanks! 

## 2019-12-03 NOTE — Telephone Encounter (Signed)
I'm doubting insurance will cover. Was [facility] discussed?

## 2019-12-15 ENCOUNTER — Other Ambulatory Visit: Payer: Self-pay

## 2019-12-15 ENCOUNTER — Encounter: Payer: Self-pay | Admitting: Psychiatry

## 2019-12-15 ENCOUNTER — Ambulatory Visit (INDEPENDENT_AMBULATORY_CARE_PROVIDER_SITE_OTHER): Payer: BC Managed Care – PPO | Admitting: Psychiatry

## 2019-12-15 DIAGNOSIS — F32A Depression, unspecified: Secondary | ICD-10-CM | POA: Diagnosis not present

## 2019-12-15 DIAGNOSIS — F419 Anxiety disorder, unspecified: Secondary | ICD-10-CM | POA: Diagnosis not present

## 2019-12-15 DIAGNOSIS — F5104 Psychophysiologic insomnia: Secondary | ICD-10-CM

## 2019-12-15 MED ORDER — DEPLIN 15 15-90.314 MG PO CAPS: 15.0000 mg | ORAL_CAPSULE | Freq: Every day | ORAL | 3 refills | Status: DC

## 2019-12-15 MED ORDER — ALPRAZOLAM 1 MG PO TABS: ORAL_TABLET | ORAL | 3 refills | Status: DC

## 2019-12-15 NOTE — Progress Notes (Signed)
[name] [id number] 23-Dec-1960 59 y.o.  Subjective:   Patient ID:  Amanda [name] is a 59 y.o. (DOB Nov 18, 1960) female.  Chief Complaint:  Chief Complaint  Patient presents with  . Follow-up    Anxiety, depression    HPI [name] presents to the office today for follow-up of depression and anxiety. She reports that Deplin seems to be helping. She denies any depression or anxiety. "I don't even go to those depressed thoughts." She reports that her energy and motivation is a little low. Sleep has been ok. She reports that about once a week she has trouble sleeping and is unable to sleep without Xanax. She reports using additional Xanax prn x 2 this month. She reports that one of the times she took it was at 2:30 am when she had not been able to sleep. Appetite has been "really good." She reports that her concentration has been improved. Denies SI.   Has been working mostly from home.   Past Psychiatric Medication Trials: Wellbutrin XL- No improvement at 300 mg dose. Trintellix Lexapro- "felt the best on." Reports that she had some hair loss Zoloft Celexa Viibryd Prozac Effexor Remeron Ambien- Ineffective and caused HA Belsomra Doxepin Klonopin xanax Gabapentin Abilify    Review of Systems:  Review of Systems  Genitourinary:       She reports that her nephrologist said that her urine was negative for protein and advised her to stop Vit D.   Musculoskeletal: Negative for gait problem.  Neurological: Negative for tremors.  Psychiatric/Behavioral:       Please refer to HPI    Medications: I have reviewed the patient's current medications.  Current Outpatient Medications  Medication Sig Dispense Refill  . ALPRAZolam (XANAX) 1 MG tablet TAKE 1 TABLET AT BEDTIME AND 1/2-1 TABLET AS NEEDED FOR ANXIETY. 45 tablet 3  . Bempedoic Acid (NEXLETOL) 180 MG TABS Take 180 mg by mouth daily. 90 tablet 3  . escitalopram (LEXAPRO) 20 MG tablet Take 1 tablet (20 mg total)  by mouth daily. 90 tablet 1  . L-Methylfolate-Algae (DEPLIN 15) 15-90.314 MG CAPS Take 15 mg by mouth daily. 90 capsule 3  . metoprolol tartrate (LOPRESSOR) 25 MG tablet Take 1 tablet by mouth 2 (two) times daily.    . rizatriptan (MAXALT) 10 MG tablet rizatriptan 10 mg tablet    . vitamin B-12 (CYANOCOBALAMIN) 1000 MCG tablet Take 1,000 mcg by mouth.     No current facility-administered medications for this visit.    Medication Side Effects: None  Allergies:  Allergies  Allergen Reactions  . Ace Inhibitors     REACTION: cough  . Amoxicillin     Gi upset  . Codeine Nausea And Vomiting    Can tolerate hydrocodone if has food on stomach Can tolerate hydrocodone if has food on stomach   . Cortisone     Extremely high BP  . Hydrocodone-Acetaminophen     REACTION: vomitting  . Losartan Potassium     REACTION: cough  . Statins     Face and leg rash with rosuvastatin, pravastatin, and atorvastatin  . Zetia [Ezetimibe] Diarrhea    Past Medical History:  Diagnosis Date  . Anxiety   . Asthma   . Chronic kidney disease   . Chronic kidney disease, stage 3 ([place]) 08/10/2019  . Hyperlipidemia   . Hypertension   . Migraine     Family History  Problem Relation Age of Onset  . Asthma Father   . Heart  disease Father        CAD s/p CABG  . Elevated Lipids Father   . Depression Father   . Asthma Sister   . Elevated Lipids Sister   . Hypertension Sister   . Depression Sister   . Asthma Paternal Aunt   . Elevated Lipids Mother   . Elevated Lipids Sister   . Hypertension Sister   . Bipolar disorder Sister   . Elevated Lipids Sister   . Hypertension Sister   . Depression Sister     Social History   Socioeconomic History  . Marital status: Married    Spouse name: Not on file  . Number of children: 1  . Years of education: Not on file  . Highest education level: Not on file  Occupational History  . Occupation: Aeronautical engineer: [employer]  Tobacco Use  .  Smoking status: Former Smoker    Packs/day: 0.50    Years: 5.00    Pack years: 2.50    Types: Cigarettes    Quit date: 01/29/1987    Years since quitting: 32.8  . Smokeless tobacco: Never Used  Substance and Sexual Activity  . Alcohol use: Yes    Comment: social ETOH only  . Drug use: No  . Sexual activity: Not on file  Other Topics Concern  . Not on file  Social History Narrative  . Not on file   Social Determinants of Health   Financial Resource Strain:   . Difficulty of Paying Living Expenses: Not on file  Food Insecurity:   . Worried About Charity fundraiser in the Last Year: Not on file  . Ran Out of Food in the Last Year: Not on file  Transportation Needs:   . Lack of Transportation (Medical): Not on file  . Lack of Transportation (Non-Medical): Not on file  Physical Activity:   . Days of Exercise per Week: Not on file  . Minutes of Exercise per Session: Not on file  Stress:   . Feeling of Stress : Not on file  Social Connections:   . Frequency of Communication with Friends and Family: Not on file  . Frequency of Social Gatherings with Friends and Family: Not on file  . Attends Religious Services: Not on file  . Active Member of Clubs or Organizations: Not on file  . Attends Archivist Meetings: Not on file  . Marital Status: Not on file  Intimate Partner Violence:   . Fear of Current or Ex-Partner: Not on file  . Emotionally Abused: Not on file  . Physically Abused: Not on file  . Sexually Abused: Not on file    Past Medical History, Surgical history, Social history, and Family history were reviewed and updated as appropriate.   Please see review of systems for further details on the patient's review from today.   Objective:   Physical Exam:  LMP 03/23/2011   Physical Exam Constitutional:      General: She is not in acute distress. Musculoskeletal:        General: No deformity.  Neurological:     Mental Status: She is alert and oriented to  person, place, and time.     Coordination: Coordination normal.  Psychiatric:        Attention and Perception: Attention and perception normal. She does not perceive auditory or visual hallucinations.        Mood and Affect: Mood normal. Mood is not anxious or depressed. Affect is not  labile, blunt, angry or inappropriate.        Speech: Speech normal.        Behavior: Behavior normal.        Thought Content: Thought content normal. Thought content is not paranoid or delusional. Thought content does not include homicidal or suicidal ideation. Thought content does not include homicidal or suicidal plan.        Cognition and Memory: Cognition and memory normal.        Judgment: Judgment normal.     Comments: Insight intact     Lab Review:     Component Value Date/Time   NA 138 11/03/2019 1107   K 4.2 11/03/2019 1107   CL 103 11/03/2019 1107   CO2 22 11/03/2019 1107   GLUCOSE 80 11/03/2019 1107   GLUCOSE 74 05/25/2008 1036   BUN 17 11/03/2019 1107   CREATININE 1.68 (H) 11/03/2019 1107   CALCIUM 10.1 11/03/2019 1107   PROT 7.4 08/10/2019 1300   ALBUMIN 4.6 08/10/2019 1300   AST 24 08/10/2019 1300   ALT 24 08/10/2019 1300   ALKPHOS 75 08/10/2019 1300   BILITOT 0.5 08/10/2019 1300   GFRNONAA 33 (L) 11/03/2019 1107   GFRAA 38 (L) 11/03/2019 1107       Component Value Date/Time   WBC 9.4 04/04/2011 1622   RBC 4.36 04/04/2011 1622   HGB 13.6 04/04/2011 1622   HCT 40.3 04/04/2011 1622   PLT 343.0 04/04/2011 1622   MCV 92.6 04/04/2011 1622   MCHC 33.8 04/04/2011 1622   RDW 12.5 04/04/2011 1622   LYMPHSABS 2.4 04/04/2011 1622   MONOABS 0.7 04/04/2011 1622   EOSABS 0.1 04/04/2011 1622   BASOSABS 0.1 04/04/2011 1622    No results found for: POCLITH, LITHIUM   No results found for: PHENYTOIN, PHENOBARB, VALPROATE, CBMZ   .res Assessment: Plan:   Will continue current plan of care since target signs and symptoms are well controlled without any tolerability issues. Will send  prescription for Deplin to [facility] since patient reports that Deplin seems to be helpful with her depression and discussed that price would likely be less through [facility].  We will continue Deplin 15 mg daily for augmentation of depression. Continue Lexapro 20 mg daily for mood and anxiety. Continue Xanax 1 tab bedtime and 1/2 to 1 tablet daily as needed for anxiety. Patient to follow-up in 3 months or sooner if clinically indicated. Patient advised to contact office with any questions, adverse effects, or acute worsening in signs and symptoms.  Deaunna was seen today for follow-up.  Diagnoses and all orders for this visit:  Depression, unspecified depression type -     L-Methylfolate-Algae (DEPLIN 15) 15-90.314 MG CAPS; Take 15 mg by mouth daily.  Anxiety -     ALPRAZolam (XANAX) 1 MG tablet; TAKE 1 TABLET AT BEDTIME AND 1/2-1 TABLET AS NEEDED FOR ANXIETY.  Chronic insomnia -     ALPRAZolam (XANAX) 1 MG tablet; TAKE 1 TABLET AT BEDTIME AND 1/2-1 TABLET AS NEEDED FOR ANXIETY.     Please see After Visit Summary for patient specific instructions.  Future Appointments  Date Time Provider [place]  03/16/2020 11:00 AM Thayer Headings, PMHNP CP-[facility] None    No orders of the defined types were placed in this encounter.   -------------------------------

## 2020-03-16 ENCOUNTER — Encounter: Payer: Self-pay | Admitting: Psychiatry

## 2020-03-16 ENCOUNTER — Other Ambulatory Visit: Payer: Self-pay

## 2020-03-16 ENCOUNTER — Ambulatory Visit (INDEPENDENT_AMBULATORY_CARE_PROVIDER_SITE_OTHER): Payer: BC Managed Care – PPO | Admitting: Psychiatry

## 2020-03-16 DIAGNOSIS — F419 Anxiety disorder, unspecified: Secondary | ICD-10-CM

## 2020-03-16 DIAGNOSIS — F32A Depression, unspecified: Secondary | ICD-10-CM

## 2020-03-16 DIAGNOSIS — F5104 Psychophysiologic insomnia: Secondary | ICD-10-CM

## 2020-03-16 MED ORDER — ESCITALOPRAM OXALATE 20 MG PO TABS: 20.0000 mg | ORAL_TABLET | Freq: Every day | ORAL | 1 refills | Status: DC

## 2020-03-16 NOTE — Progress Notes (Signed)
Amanda [name] [id number] 1960-03-23 60 y.o.  Subjective:   Patient ID:  Amanda [name] is a 60 y.o. (DOB [birth year]) female.  Chief Complaint:  Chief Complaint  Patient presents with  . Follow-up    Anxiety, depression, insomnia    HPI Amanda [name] presents to the office today for follow-up of depression, anxiety, and insomnia. She reports, "I had a really bad Thanksgiving" with severe depression and difficulty getting out of bed. She reports that she also had depression at Christmas, "but not as bad." She reports that she usually has worsening mood around the holidays and it was worse this year. She reports that she was having difficulty with memory and family noticed this. She reports that she stopped her cholesterol medication since this seemed to be causing problems with her memory and word finding difficulties. She reports that her memory and mood improved after stopping cholesterol medication. Now able to concentrate and focus without difficulty. She notices depression "some days."She reports that she has had some stressors with Amanda [name] and this seems to affect her mood. She reports worry about her Amanda [name]. Amanda [name] sees a Social worker weekly. She reports that she will occasionally feel nervous for "no reason." Denies any recent panic attacks. She reports that Xanax has been helping her sleep. Reports that she is sleeping through the night. She reports that her appetite has been "too good." Energy and motivation have been good. Denies SI.   Reports that she is back to working daily, full days.  She reports that she takes Xanax prn periodically. She may not take it some weeks, and other weeks will take it more if there is increased stress.   Amanda [name] 44.60 years old[age]   Past Psychiatric Medication Trials: WellbutrinXL- No improvement at 300 mg dose. Trintellix Lexapro- "felt the best on." Reports that she had some hair  loss Zoloft Celexa Viibryd Prozac Effexor Remeron Ambien- Ineffective and caused HA Belsomra Doxepin Klonopin xanax Gabapentin Abilify    Review of Systems:  Review of Systems  Gastrointestinal: Negative.   Musculoskeletal: Negative for gait problem.  Neurological: Negative for tremors.       Reports diminished sense of taste for about a year  Psychiatric/Behavioral:       Please refer to HPI    Medications: I have reviewed the patient's current medications.  Current Outpatient Medications  Medication Sig Dispense Refill  . ALPRAZolam (XANAX) 1 MG tablet TAKE 1 TABLET AT BEDTIME AND 1/2-1 TABLET AS NEEDED FOR ANXIETY. 45 tablet 3  . diphenhydrAMINE (BENADRYL ALLERGY) 25 mg capsule Take 25 mg by mouth every 6 (six) hours as needed.    Marland Kitchen L-Methylfolate-Algae (DEPLIN 15) 15-90.314 MG CAPS Take 15 mg by mouth daily. 90 capsule 3  . metoprolol tartrate (LOPRESSOR) 25 MG tablet Take 1 tablet by mouth 2 (two) times daily.    . vitamin B-12 (CYANOCOBALAMIN) 1000 MCG tablet Take 1,000 mcg by mouth.    . Bempedoic Acid (NEXLETOL) 180 MG TABS Take 180 mg by mouth daily. (Patient not taking: Reported on 03/16/2020) 90 tablet 3  . escitalopram (LEXAPRO) 20 MG tablet Take 1 tablet (20 mg total) by mouth daily. 90 tablet 1  . rizatriptan (MAXALT) 10 MG tablet rizatriptan 10 mg tablet     No current facility-administered medications for this visit.    Medication Side Effects: Other: ?Diminshed taste  Allergies:  Allergies  Allergen Reactions  . Ace Inhibitors     REACTION: cough  . Amoxicillin  Gi upset  . Codeine Nausea And Vomiting    Can tolerate hydrocodone if has food on stomach Can tolerate hydrocodone if has food on stomach   . Cortisone     Extremely high BP  . Hydrocodone-Acetaminophen     REACTION: vomitting  . Losartan Potassium     REACTION: cough  . Statins     Face and leg rash with rosuvastatin, pravastatin, and atorvastatin  . Zetia [Ezetimibe]  Diarrhea    Past Medical History:  Diagnosis Date  . Anxiety   . Asthma   . Chronic kidney disease   . Chronic kidney disease, stage 3 ([place]) 08/10/2019  . Hyperlipidemia   . Hypertension   . Migraine     Family History  Problem Relation Age of Onset  . Asthma Father   . Heart disease Father        CAD s/p CABG  . Elevated Lipids Father   . Depression Father   . Asthma Sister   . Elevated Lipids Sister   . Hypertension Sister   . Depression Sister   . Asthma Paternal Aunt   . Elevated Lipids Mother   . Elevated Lipids Sister   . Hypertension Sister   . Bipolar disorder Sister   . Elevated Lipids Sister   . Hypertension Sister   . Depression Sister     Social History   Socioeconomic History  . Marital status: Married    Spouse name: Not on file  . Number of children: 1  . Years of education: Not on file  . Highest education level: Not on file  Occupational History  . Occupation: Aeronautical engineer: [employer]  Tobacco Use  . Smoking status: Former Smoker    Packs/day: 0.50    Years: 5.00    Pack years: 2.50    Types: Cigarettes    Quit date: 01/29/1987    Years since quitting: 33.1  . Smokeless tobacco: Never Used  Substance and Sexual Activity  . Alcohol use: Yes    Comment: social ETOH only  . Drug use: No  . Sexual activity: Not on file  Other Topics Concern  . Not on file  Social History Narrative  . Not on file   Social Determinants of Health   Financial Resource Strain: Not on file  Food Insecurity: Not on file  Transportation Needs: Not on file  Physical Activity: Not on file  Stress: Not on file  Social Connections: Not on file  Intimate Partner Violence: Not on file    Past Medical History, Surgical history, Social history, and Family history were reviewed and updated as appropriate.   Please see review of systems for further details on the patient's review from today.   Objective:   Physical Exam:  LMP 03/23/2011    Physical Exam Constitutional:      General: She is not in acute distress. Musculoskeletal:        General: No deformity.  Neurological:     Mental Status: She is alert and oriented to person, place, and time.     Coordination: Coordination normal.  Psychiatric:        Attention and Perception: Attention and perception normal. She does not perceive auditory or visual hallucinations.        Mood and Affect: Mood normal. Mood is not anxious or depressed. Affect is not labile, blunt, angry or inappropriate.        Speech: Speech normal.  Behavior: Behavior normal.        Thought Content: Thought content normal. Thought content is not paranoid or delusional. Thought content does not include homicidal or suicidal ideation. Thought content does not include homicidal or suicidal plan.        Cognition and Memory: Cognition and memory normal.        Judgment: Judgment normal.     Comments: Insight intact     Lab Review:     Component Value Date/Time   NA 138 11/03/2019 1107   K 4.2 11/03/2019 1107   CL 103 11/03/2019 1107   CO2 22 11/03/2019 1107   GLUCOSE 80 11/03/2019 1107   GLUCOSE 74 05/25/2008 1036   BUN 17 11/03/2019 1107   CREATININE 1.68 (H) 11/03/2019 1107   CALCIUM 10.1 11/03/2019 1107   PROT 7.4 08/10/2019 1300   ALBUMIN 4.6 08/10/2019 1300   AST 24 08/10/2019 1300   ALT 24 08/10/2019 1300   ALKPHOS 75 08/10/2019 1300   BILITOT 0.5 08/10/2019 1300   GFRNONAA 33 (L) 11/03/2019 1107   GFRAA 38 (L) 11/03/2019 1107       Component Value Date/Time   WBC 9.4 04/04/2011 1622   RBC 4.36 04/04/2011 1622   HGB 13.6 04/04/2011 1622   HCT 40.3 04/04/2011 1622   PLT 343.0 04/04/2011 1622   MCV 92.6 04/04/2011 1622   MCHC 33.8 04/04/2011 1622   RDW 12.5 04/04/2011 1622   LYMPHSABS 2.4 04/04/2011 1622   MONOABS 0.7 04/04/2011 1622   EOSABS 0.1 04/04/2011 1622   BASOSABS 0.1 04/04/2011 1622    No results found for: POCLITH, LITHIUM   No results found for:  PHENYTOIN, PHENOBARB, VALPROATE, CBMZ   .res Assessment: Plan:   Continue Lexapro 20 mg daily for anxiety and depression. Continue Xanax 1 mg at bedtime and 1/2 to 1 tablet as needed for anxiety. Continue Deplin 15 mg daily for augmentation of depression. Patient to follow-up in 3 months or sooner if clinically indicated. Patient advised to contact office with any questions, adverse effects, or acute worsening in signs and symptoms.  Chantia was seen today for follow-up.  Diagnoses and all orders for this visit:  Depression, unspecified depression type -     escitalopram (LEXAPRO) 20 MG tablet; Take 1 tablet (20 mg total) by mouth daily.  Anxiety -     escitalopram (LEXAPRO) 20 MG tablet; Take 1 tablet (20 mg total) by mouth daily.  Chronic insomnia     Please see After Visit Summary for patient specific instructions.  Future Appointments  Date Time Provider [place]  06/13/2020 10:00 AM Thayer Headings, PMHNP CP-CP None    No orders of the defined types were placed in this encounter.   -------------------------------

## 2020-03-21 ENCOUNTER — Ambulatory Visit (HOSPITAL_COMMUNITY)
Admission: EM | Admit: 2020-03-21 | Discharge: 2020-03-21 | Disposition: A | Discharge status: Home / Self Care | Payer: BC Managed Care – PPO | Attending: Family Medicine | Admitting: Family Medicine

## 2020-03-21 ENCOUNTER — Encounter (HOSPITAL_COMMUNITY): Payer: Self-pay | Admitting: Emergency Medicine

## 2020-03-21 ENCOUNTER — Inpatient Hospital Stay (HOSPITAL_COMMUNITY)
Admission: EM | Admit: 2020-03-21 | Discharge: 2020-03-24 | DRG: 392 | Disposition: A | Discharge status: Home / Self Care | Payer: BC Managed Care – PPO | Attending: Internal Medicine | Admitting: Internal Medicine

## 2020-03-21 ENCOUNTER — Other Ambulatory Visit: Payer: Self-pay

## 2020-03-21 DIAGNOSIS — E785 Hyperlipidemia, unspecified: Secondary | ICD-10-CM | POA: Diagnosis present

## 2020-03-21 DIAGNOSIS — Z87891 Personal history of nicotine dependence: Secondary | ICD-10-CM

## 2020-03-21 DIAGNOSIS — D259 Leiomyoma of uterus, unspecified: Secondary | ICD-10-CM | POA: Diagnosis not present

## 2020-03-21 DIAGNOSIS — N1831 Chronic kidney disease, stage 3a: Secondary | ICD-10-CM | POA: Diagnosis not present

## 2020-03-21 DIAGNOSIS — K625 Hemorrhage of anus and rectum: Secondary | ICD-10-CM | POA: Diagnosis not present

## 2020-03-21 DIAGNOSIS — Z20822 Contact with and (suspected) exposure to covid-19: Secondary | ICD-10-CM | POA: Diagnosis not present

## 2020-03-21 DIAGNOSIS — R197 Diarrhea, unspecified: Secondary | ICD-10-CM

## 2020-03-21 DIAGNOSIS — I129 Hypertensive chronic kidney disease with stage 1 through stage 4 chronic kidney disease, or unspecified chronic kidney disease: Secondary | ICD-10-CM | POA: Diagnosis not present

## 2020-03-21 DIAGNOSIS — D1809 Hemangioma of other sites: Secondary | ICD-10-CM | POA: Diagnosis not present

## 2020-03-21 DIAGNOSIS — K8020 Calculus of gallbladder without cholecystitis without obstruction: Secondary | ICD-10-CM | POA: Diagnosis not present

## 2020-03-21 DIAGNOSIS — G43909 Migraine, unspecified, not intractable, without status migrainosus: Secondary | ICD-10-CM | POA: Diagnosis not present

## 2020-03-21 DIAGNOSIS — Z825 Family history of asthma and other chronic lower respiratory diseases: Secondary | ICD-10-CM

## 2020-03-21 DIAGNOSIS — K529 Noninfective gastroenteritis and colitis, unspecified: Secondary | ICD-10-CM | POA: Diagnosis not present

## 2020-03-21 DIAGNOSIS — Z818 Family history of other mental and behavioral disorders: Secondary | ICD-10-CM | POA: Diagnosis not present

## 2020-03-21 DIAGNOSIS — Z885 Allergy status to narcotic agent status: Secondary | ICD-10-CM | POA: Diagnosis not present

## 2020-03-21 DIAGNOSIS — F419 Anxiety disorder, unspecified: Secondary | ICD-10-CM | POA: Diagnosis present

## 2020-03-21 DIAGNOSIS — F32A Depression, unspecified: Secondary | ICD-10-CM | POA: Diagnosis not present

## 2020-03-21 DIAGNOSIS — K6389 Other specified diseases of intestine: Secondary | ICD-10-CM | POA: Diagnosis not present

## 2020-03-21 DIAGNOSIS — Z8249 Family history of ischemic heart disease and other diseases of the circulatory system: Secondary | ICD-10-CM | POA: Diagnosis not present

## 2020-03-21 DIAGNOSIS — J45909 Unspecified asthma, uncomplicated: Secondary | ICD-10-CM | POA: Diagnosis not present

## 2020-03-21 DIAGNOSIS — Z880 Allergy status to penicillin: Secondary | ICD-10-CM

## 2020-03-21 DIAGNOSIS — N183 Chronic kidney disease, stage 3 unspecified: Secondary | ICD-10-CM | POA: Diagnosis present

## 2020-03-21 DIAGNOSIS — Z888 Allergy status to other drugs, medicaments and biological substances status: Secondary | ICD-10-CM

## 2020-03-21 DIAGNOSIS — R1032 Left lower quadrant pain: Secondary | ICD-10-CM

## 2020-03-21 HISTORY — DX: Depression, unspecified: F32.A

## 2020-03-21 LAB — COMPREHENSIVE METABOLIC PANEL
ALT: 23 U/L (ref 0–44)
AST: 23 U/L (ref 15–41)
Albumin: 4 g/dL (ref 3.5–5.0)
Alkaline Phosphatase: 94 U/L (ref 38–126)
Anion gap: 11 (ref 5–15)
BUN: 12 mg/dL (ref 6–20)
CO2: 22 mmol/L (ref 22–32)
Calcium: 9.7 mg/dL (ref 8.9–10.3)
Chloride: 102 mmol/L (ref 98–111)
Creatinine, Ser: 1.07 mg/dL — ABNORMAL HIGH (ref 0.44–1.00)
GFR, Estimated: 60 mL/min — ABNORMAL LOW (ref >60)
Glucose, Bld: 121 mg/dL — ABNORMAL HIGH (ref 70–99)
Potassium: 3.5 mmol/L (ref 3.5–5.1)
Sodium: 135 mmol/L (ref 135–145)
Total Bilirubin: 0.8 mg/dL (ref 0.3–1.2)
Total Protein: 7.5 g/dL (ref 6.5–8.1)

## 2020-03-21 LAB — URINALYSIS, ROUTINE W REFLEX MICROSCOPIC
Bilirubin Urine: NEGATIVE
Glucose, UA: NEGATIVE mg/dL
Hgb urine dipstick: NEGATIVE
Ketones, ur: NEGATIVE mg/dL
Leukocytes,Ua: NEGATIVE
Nitrite: NEGATIVE
Protein, ur: NEGATIVE mg/dL
Specific Gravity, Urine: 1.029 (ref 1.005–1.030)
pH: 5 (ref 5.0–8.0)

## 2020-03-21 LAB — CBC
HCT: 42.6 % (ref 36.0–46.0)
Hemoglobin: 14.2 g/dL (ref 12.0–15.0)
MCH: 29.7 pg (ref 26.0–34.0)
MCHC: 33.3 g/dL (ref 30.0–36.0)
MCV: 89.1 fL (ref 80.0–100.0)
Platelets: 353 10*3/uL (ref 150–400)
RBC: 4.78 MIL/uL (ref 3.87–5.11)
RDW: 12 % (ref 11.5–15.5)
WBC: 13 10*3/uL — ABNORMAL HIGH (ref 4.0–10.5)
nRBC: 0 % (ref 0.0–0.2)

## 2020-03-21 LAB — POCT URINALYSIS DIPSTICK, ED / UC
Bilirubin Urine: NEGATIVE
Glucose, UA: NEGATIVE mg/dL
Ketones, ur: NEGATIVE mg/dL
Leukocytes,Ua: NEGATIVE
Nitrite: NEGATIVE
Protein, ur: NEGATIVE mg/dL
Specific Gravity, Urine: >=1.03 (ref 1.005–1.030)
Urobilinogen, UA: 0.2 mg/dL (ref 0.0–1.0)
pH: 5 (ref 5.0–8.0)

## 2020-03-21 LAB — LIPASE, BLOOD: Lipase: 33 U/L (ref 11–51)

## 2020-03-21 NOTE — ED Triage Notes (Signed)
C/o lower abd pain since 2:30am with diarrhea and bright red rectal bleeding.  Denies nausea and vomiting.

## 2020-03-21 NOTE — ED Triage Notes (Signed)
Pt presents today with lower abdominal pain with diarrhea/nausea since this a.m. Denies fever or vomiting.

## 2020-03-22 ENCOUNTER — Emergency Department (HOSPITAL_COMMUNITY): Payer: BC Managed Care – PPO

## 2020-03-22 DIAGNOSIS — Z825 Family history of asthma and other chronic lower respiratory diseases: Secondary | ICD-10-CM | POA: Diagnosis not present

## 2020-03-22 DIAGNOSIS — Z87891 Personal history of nicotine dependence: Secondary | ICD-10-CM | POA: Diagnosis not present

## 2020-03-22 DIAGNOSIS — Z880 Allergy status to penicillin: Secondary | ICD-10-CM | POA: Diagnosis not present

## 2020-03-22 DIAGNOSIS — K529 Noninfective gastroenteritis and colitis, unspecified: Secondary | ICD-10-CM | POA: Diagnosis present

## 2020-03-22 DIAGNOSIS — N183 Chronic kidney disease, stage 3 unspecified: Secondary | ICD-10-CM | POA: Diagnosis present

## 2020-03-22 DIAGNOSIS — K625 Hemorrhage of anus and rectum: Secondary | ICD-10-CM

## 2020-03-22 DIAGNOSIS — F419 Anxiety disorder, unspecified: Secondary | ICD-10-CM | POA: Diagnosis present

## 2020-03-22 DIAGNOSIS — G43909 Migraine, unspecified, not intractable, without status migrainosus: Secondary | ICD-10-CM | POA: Diagnosis present

## 2020-03-22 DIAGNOSIS — Z888 Allergy status to other drugs, medicaments and biological substances status: Secondary | ICD-10-CM | POA: Diagnosis not present

## 2020-03-22 DIAGNOSIS — J45909 Unspecified asthma, uncomplicated: Secondary | ICD-10-CM | POA: Diagnosis present

## 2020-03-22 DIAGNOSIS — Z20822 Contact with and (suspected) exposure to covid-19: Secondary | ICD-10-CM | POA: Diagnosis present

## 2020-03-22 DIAGNOSIS — Z8249 Family history of ischemic heart disease and other diseases of the circulatory system: Secondary | ICD-10-CM | POA: Diagnosis not present

## 2020-03-22 DIAGNOSIS — I129 Hypertensive chronic kidney disease with stage 1 through stage 4 chronic kidney disease, or unspecified chronic kidney disease: Secondary | ICD-10-CM | POA: Diagnosis present

## 2020-03-22 DIAGNOSIS — F32A Depression, unspecified: Secondary | ICD-10-CM | POA: Diagnosis present

## 2020-03-22 DIAGNOSIS — Z818 Family history of other mental and behavioral disorders: Secondary | ICD-10-CM | POA: Diagnosis not present

## 2020-03-22 DIAGNOSIS — Z885 Allergy status to narcotic agent status: Secondary | ICD-10-CM | POA: Diagnosis not present

## 2020-03-22 DIAGNOSIS — E785 Hyperlipidemia, unspecified: Secondary | ICD-10-CM | POA: Diagnosis present

## 2020-03-22 DIAGNOSIS — N1831 Chronic kidney disease, stage 3a: Secondary | ICD-10-CM

## 2020-03-22 LAB — RESP PANEL BY RT-PCR (FLU A&B, COVID) ARPGX2
Influenza A by PCR: NEGATIVE
Influenza B by PCR: NEGATIVE
SARS Coronavirus 2 by RT PCR: NEGATIVE

## 2020-03-22 LAB — ABO/RH: ABO/RH(D): O POS

## 2020-03-22 LAB — BASIC METABOLIC PANEL
Anion gap: 9 (ref 5–15)
BUN: 11 mg/dL (ref 6–20)
CO2: 24 mmol/L (ref 22–32)
Calcium: 8.8 mg/dL — ABNORMAL LOW (ref 8.9–10.3)
Chloride: 105 mmol/L (ref 98–111)
Creatinine, Ser: 1.05 mg/dL — ABNORMAL HIGH (ref 0.44–1.00)
GFR, Estimated: >60 mL/min (ref >60)
Glucose, Bld: 104 mg/dL — ABNORMAL HIGH (ref 70–99)
Potassium: 4 mmol/L (ref 3.5–5.1)
Sodium: 138 mmol/L (ref 135–145)

## 2020-03-22 LAB — CBC
HCT: 37.8 % (ref 36.0–46.0)
Hemoglobin: 12.3 g/dL (ref 12.0–15.0)
MCH: 29.8 pg (ref 26.0–34.0)
MCHC: 32.5 g/dL (ref 30.0–36.0)
MCV: 91.5 fL (ref 80.0–100.0)
Platelets: 281 10*3/uL (ref 150–400)
RBC: 4.13 MIL/uL (ref 3.87–5.11)
RDW: 12.4 % (ref 11.5–15.5)
WBC: 9 10*3/uL (ref 4.0–10.5)
nRBC: 0 % (ref 0.0–0.2)

## 2020-03-22 LAB — HEMOGLOBIN AND HEMATOCRIT, BLOOD
HCT: 40.8 % (ref 36.0–46.0)
Hemoglobin: 14.3 g/dL (ref 12.0–15.0)

## 2020-03-22 LAB — TYPE AND SCREEN
ABO/RH(D): O POS
Antibody Screen: NEGATIVE

## 2020-03-22 LAB — POC OCCULT BLOOD, ED: Fecal Occult Bld: POSITIVE — AB

## 2020-03-22 LAB — HIV ANTIBODY (ROUTINE TESTING W REFLEX): HIV Screen 4th Generation wRfx: NONREACTIVE

## 2020-03-22 MED ORDER — SODIUM CHLORIDE 0.9 % IV BOLUS
1000.0000 mL | Freq: Once | INTRAVENOUS | Status: AC
  Administered 2020-03-22: 1000 mL via INTRAVENOUS

## 2020-03-22 MED ORDER — SODIUM CHLORIDE 0.9 % IV SOLN
2.0000 g | INTRAVENOUS | Status: DC
  Administered 2020-03-22 – 2020-03-24 (×3): 2 g via INTRAVENOUS
  Filled 2020-03-22 (×3): qty 20

## 2020-03-22 MED ORDER — ONDANSETRON HCL 4 MG/2ML IJ SOLN
4.0000 mg | Freq: Four times a day (QID) | INTRAMUSCULAR | Status: DC | PRN
  Administered 2020-03-22 (×2): 4 mg via INTRAVENOUS
  Filled 2020-03-22 (×2): qty 2

## 2020-03-22 MED ORDER — ONDANSETRON HCL 4 MG PO TABS: 4.0000 mg | ORAL_TABLET | Freq: Four times a day (QID) | ORAL | Status: DC | PRN

## 2020-03-22 MED ORDER — IOHEXOL 300 MG/ML  SOLN
100.0000 mL | Freq: Once | INTRAMUSCULAR | Status: AC | PRN
  Administered 2020-03-22: 100 mL via INTRAVENOUS

## 2020-03-22 MED ORDER — ONDANSETRON HCL 4 MG/2ML IJ SOLN
4.0000 mg | Freq: Once | INTRAMUSCULAR | Status: AC
  Administered 2020-03-22: 4 mg via INTRAVENOUS
  Filled 2020-03-22: qty 2

## 2020-03-22 MED ORDER — CIPROFLOXACIN IN D5W 400 MG/200ML IV SOLN
400.0000 mg | Freq: Once | INTRAVENOUS | Status: AC
  Administered 2020-03-22: 400 mg via INTRAVENOUS
  Filled 2020-03-22: qty 200

## 2020-03-22 MED ORDER — ESCITALOPRAM OXALATE 20 MG PO TABS
20.0000 mg | ORAL_TABLET | Freq: Every day | ORAL | Status: DC
  Administered 2020-03-23 – 2020-03-24 (×2): 20 mg via ORAL
  Filled 2020-03-22 (×2): qty 1

## 2020-03-22 MED ORDER — MORPHINE SULFATE (PF) 4 MG/ML IV SOLN
4.0000 mg | Freq: Once | INTRAVENOUS | Status: AC
  Administered 2020-03-22: 4 mg via INTRAVENOUS
  Filled 2020-03-22: qty 1

## 2020-03-22 MED ORDER — PROCHLORPERAZINE EDISYLATE 10 MG/2ML IJ SOLN
10.0000 mg | INTRAMUSCULAR | Status: DC | PRN
  Administered 2020-03-22: 10 mg via INTRAVENOUS
  Filled 2020-03-22: qty 2

## 2020-03-22 MED ORDER — ALPRAZOLAM 0.5 MG PO TABS
1.0000 mg | ORAL_TABLET | Freq: Every day | ORAL | Status: DC
  Administered 2020-03-22 – 2020-03-23 (×2): 1 mg via ORAL
  Filled 2020-03-22 (×2): qty 2

## 2020-03-22 MED ORDER — ALPRAZOLAM 0.25 MG PO TABS: 0.5000 mg | ORAL_TABLET | Freq: Every day | ORAL | Status: DC | PRN

## 2020-03-22 MED ORDER — ACETAMINOPHEN 325 MG PO TABS: 650.0000 mg | ORAL_TABLET | Freq: Four times a day (QID) | ORAL | Status: DC | PRN

## 2020-03-22 MED ORDER — SODIUM CHLORIDE 0.9 % IV SOLN: INTRAVENOUS | Status: DC

## 2020-03-22 MED ORDER — METOPROLOL TARTRATE 25 MG PO TABS
25.0000 mg | ORAL_TABLET | Freq: Two times a day (BID) | ORAL | Status: DC
Start: 2020-03-22 — End: 2020-03-24
  Administered 2020-03-22 – 2020-03-24 (×4): 25 mg via ORAL
  Filled 2020-03-22 (×4): qty 1

## 2020-03-22 MED ORDER — VITAMIN B-12 1000 MCG PO TABS
1000.0000 ug | ORAL_TABLET | Freq: Every day | ORAL | Status: DC
  Administered 2020-03-23 – 2020-03-24 (×2): 1000 ug via ORAL
  Filled 2020-03-22 (×2): qty 1

## 2020-03-22 MED ORDER — METRONIDAZOLE IN NACL 5-0.79 MG/ML-% IV SOLN
500.0000 mg | Freq: Once | INTRAVENOUS | Status: AC
  Administered 2020-03-22: 500 mg via INTRAVENOUS
  Filled 2020-03-22: qty 100

## 2020-03-22 MED ORDER — ACETAMINOPHEN 650 MG RE SUPP: 650.0000 mg | Freq: Four times a day (QID) | RECTAL | Status: DC | PRN

## 2020-03-22 MED ORDER — DEPLIN 15 15-90.314 MG PO CAPS: 15.0000 mg | ORAL_CAPSULE | Freq: Every day | ORAL | Status: DC

## 2020-03-22 MED ORDER — MORPHINE SULFATE (PF) 2 MG/ML IV SOLN: 2.0000 mg | INTRAVENOUS | Status: DC | PRN

## 2020-03-22 MED ORDER — METRONIDAZOLE IN NACL 5-0.79 MG/ML-% IV SOLN
500.0000 mg | Freq: Three times a day (TID) | INTRAVENOUS | Status: DC
  Administered 2020-03-22 – 2020-03-24 (×6): 500 mg via INTRAVENOUS
  Filled 2020-03-22 (×6): qty 100

## 2020-03-22 NOTE — ED Notes (Signed)
Pt transferred to hospital bed for comfort, during transfer pt became very nauseated, will give PRN zofran

## 2020-03-22 NOTE — ED Notes (Signed)
Pt brought ginger ale and saltine crackers

## 2020-03-22 NOTE — ED Notes (Signed)
Attempted to call report to 5W, RN unable to take report at this time. Number given for callback.

## 2020-03-22 NOTE — H&P (Addendum)
History and Physical    Amanda [name] PZW:[id number] DOB: April 08, 1960 DOA: 03/21/2020  PCP: Antony Contras, MD  Patient coming from: Home  I have personally briefly reviewed patient's old medical records in [place]  Chief Complaint: Abd pain, rectal bleeding  HPI: Amanda [name] is a 60 y.o. female with medical history significant of CKD 3, HTN, HLD.  Pt presents to the ED for c/o abd pain and BRBPR.  Pt woke up from sleep about 20h PTA with diffuse lower abd pain.  Initially had episode of diarrhea without any blood however subsequent episodes of diarrhea have bright red blood in them.  Now no longer passing stool with BMs, just BRB with some small clots.  Last colonoscopy ~ 10 years ago with Dr. Collene Mares.  Followed by [facility] PCP.  Pain is 7/10, has some persistent nausea.  No vomiting episodes.  No fevers, chills, CP, SOB.   ED Course: Creat 1.0 (actually better than it has been for a while), WBC 13k.  CT a/p = colitis.   Review of Systems: As per HPI, otherwise all review of systems negative.  Past Medical History:  Diagnosis Date  . Anxiety   . Asthma   . Chronic kidney disease   . Chronic kidney disease, stage 3 ([place]) 08/10/2019  . Depression   . Hyperlipidemia   . Hypertension   . Migraine     Past Surgical History:  Procedure Laterality Date  . APPENDECTOMY    . CARPAL TUNNEL RELEASE     rt  . FOOT SURGERY Bilateral    Plantar fasciitis  . OOPHORECTOMY       reports that she quit smoking about 33 years ago. Her smoking use included cigarettes. She has a 2.50 pack-year smoking history. She has never used smokeless tobacco. She reports current alcohol use. She reports that she does not use drugs.  Allergies  Allergen Reactions  . Ace Inhibitors     REACTION: cough  . Ambien [Zolpidem]     Other reaction(s): headache/neck thumping  . Amoxicillin     Gi upset  . Codeine Nausea And Vomiting    Can tolerate hydrocodone if has food on stomach Can  tolerate hydrocodone if has food on stomach   . Cortisone     Extremely high BP  . Crestor [Rosuvastatin]     Other reaction(s): facial rash  . Felodipine     Other reaction(s): increased flushing and made her feel bad  . Hydrocodone-Acetaminophen     REACTION: vomitting  . Lisinopril Cough  . Losartan Potassium     REACTION: cough  . Statins     Face and leg rash with rosuvastatin, pravastatin, and atorvastatin  . Venlafaxine     Other reaction(s): headaches/elevated blood pressure  . Zetia [Ezetimibe] Diarrhea    Family History  Problem Relation Age of Onset  . Asthma Father   . Heart disease Father        CAD s/p CABG  . Elevated Lipids Father   . Depression Father   . Asthma Sister   . Elevated Lipids Sister   . Hypertension Sister   . Depression Sister   . Asthma Paternal Aunt   . Elevated Lipids Mother   . Elevated Lipids Sister   . Hypertension Sister   . Bipolar disorder Sister   . Elevated Lipids Sister   . Hypertension Sister   . Depression Sister      Prior to Admission medications   Medication Sig  Start Date End Date Taking? Authorizing Provider  ALPRAZolam (XANAX) 1 MG tablet TAKE 1 TABLET AT BEDTIME AND 1/2-1 TABLET AS NEEDED FOR ANXIETY. Patient taking differently: Take 0.5-1 mg by mouth See admin instructions. TAKE 1 TABLET AT BEDTIME AND 1/2-1 TABLET AS NEEDED FOR ANXIETY. 12/15/19  Yes Thayer Headings, PMHNP  Cimetidine (TAGAMET PO) Take 1 tablet by mouth daily as needed (heartburn).   Yes [provider]  diphenhydrAMINE (BENADRYL) 25 mg capsule Take 25 mg by mouth every 6 (six) hours as needed for itching, sleep or allergies.   Yes [provider]  escitalopram (LEXAPRO) 20 MG tablet Take 1 tablet (20 mg total) by mouth daily. 03/16/20  Yes Thayer Headings, PMHNP  ESOMEPRAZOLE MAGNESIUM PO Take 20 mg by mouth daily as needed (heartburn).   Yes [provider]  ibuprofen (ADVIL) 200 MG tablet Take 200 mg by mouth every 6  (six) hours as needed for moderate pain or headache.   Yes [provider]  L-Methylfolate-Algae (DEPLIN 15) 15-90.314 MG CAPS Take 15 mg by mouth daily. 12/15/19  Yes Thayer Headings, PMHNP  metoprolol tartrate (LOPRESSOR) 25 MG tablet Take 25 mg by mouth 2 (two) times daily. 04/16/13  Yes [provider]  rizatriptan (MAXALT) 10 MG tablet Take 10 mg by mouth as needed for migraine.   Yes [provider]  vitamin B-12 (CYANOCOBALAMIN) 1000 MCG tablet Take 1,000 mcg by mouth.   Yes [provider]    Physical Exam: Vitals:   03/22/20 0345 03/22/20 0400 03/22/20 0415 03/22/20 0500  BP: 135/74 134/78 128/72 123/71  Pulse: 82 87 88 79  Resp: 12 15 16 12   Temp:      TempSrc:      SpO2: 95% 93% 91% 93%    Constitutional: NAD, calm, comfortable Eyes: PERRL, lids and conjunctivae normal ENMT: Mucous membranes are moist. Posterior pharynx clear of any exudate or lesions.Normal dentition.  Neck: normal, supple, no masses, no thyromegaly Respiratory: clear to auscultation bilaterally, no wheezing, no crackles. Normal respiratory effort. No accessory muscle use.  Cardiovascular: Regular rate and rhythm, no murmurs / rubs / gallops. No extremity edema. 2+ pedal pulses. No carotid bruits.  Abdomen: TTP to lower abdomen, worse in LLQ Musculoskeletal: no clubbing / cyanosis. No joint deformity upper and lower extremities. Good ROM, no contractures. Normal muscle tone.  Skin: no rashes, lesions, ulcers. No induration Neurologic: CN 2-12 grossly intact. Sensation intact, DTR normal. Strength 5/5 in all 4.  Psychiatric: Normal judgment and insight. Alert and oriented x 3. Normal mood.    Labs on Admission: I have personally reviewed following labs and imaging studies  CBC: Recent Labs  Lab 03/21/20 1720 03/22/20 0042  WBC 13.0*  --   HGB 14.2 14.3  HCT 42.6 40.8  MCV 89.1  --   PLT 353  --    Basic Metabolic Panel: Recent Labs  Lab 03/21/20 1720  NA  135  K 3.5  CL 102  CO2 22  GLUCOSE 121*  BUN 12  CREATININE 1.07*  CALCIUM 9.7   GFR: Estimated Creatinine Clearance: 51.2 mL/min (A) (by C-G formula based on SCr of 1.07 mg/dL (H)). Liver Function Tests: Recent Labs  Lab 03/21/20 1720  AST 23  ALT 23  ALKPHOS 94  BILITOT 0.8  PROT 7.5  ALBUMIN 4.0   Recent Labs  Lab 03/21/20 1720  LIPASE 33   No results for input(s): AMMONIA in the last 168 hours. Coagulation Profile: No results for input(s): INR,  PROTIME in the last 168 hours. Cardiac Enzymes: No results for input(s): CKTOTAL, CKMB, CKMBINDEX, TROPONINI in the last 168 hours. BNP (last 3 results) No results for input(s): PROBNP in the last 8760 hours. HbA1C: No results for input(s): HGBA1C in the last 72 hours. CBG: No results for input(s): GLUCAP in the last 168 hours. Lipid Profile: No results for input(s): CHOL, HDL, LDLCALC, TRIG, CHOLHDL, LDLDIRECT in the last 72 hours. Thyroid Function Tests: No results for input(s): TSH, T4TOTAL, FREET4, T3FREE, THYROIDAB in the last 72 hours. Anemia Panel: No results for input(s): VITAMINB12, FOLATE, FERRITIN, TIBC, IRON, RETICCTPCT in the last 72 hours. Urine analysis:    Component Value Date/Time   COLORURINE YELLOW 03/21/2020 1718   APPEARANCEUR TURBID (A) 03/21/2020 1718   LABSPEC 1.029 03/21/2020 1718   PHURINE 5.0 03/21/2020 1718   GLUCOSEU NEGATIVE 03/21/2020 1718   HGBUR NEGATIVE 03/21/2020 1718   HGBUR negative 07/01/2007 1327   BILIRUBINUR NEGATIVE 03/21/2020 1718   KETONESUR NEGATIVE 03/21/2020 1718   PROTEINUR NEGATIVE 03/21/2020 1718   UROBILINOGEN 0.2 03/21/2020 1539   NITRITE NEGATIVE 03/21/2020 1718   LEUKOCYTESUR NEGATIVE 03/21/2020 1718    Radiological Exams on Admission: CT Abdomen Pelvis W Contrast  Result Date: 03/22/2020 CLINICAL DATA:  Left lower quadrant pain, rectal bleeding EXAM: CT ABDOMEN AND PELVIS WITH CONTRAST TECHNIQUE: Multidetector CT imaging of the abdomen and pelvis was  performed using the standard protocol following bolus administration of intravenous contrast. CONTRAST:  136mL OMNIPAQUE IOHEXOL 300 MG/ML  SOLN COMPARISON:  Renal ultrasound 12/01/2019 FINDINGS: Lower chest: Lung bases are clear. Normal heart size. No pericardial effusion. Hepatobiliary: Hypoattenuation of the liver parenchyma may be related to contrast timing with a more normal appearance on delayed phase imaging. No concerning liver lesion. Smooth liver surface contour. Largely calcified 1.6 cm gallstone noted in the neck of the gallbladder. No peri or cystic fluid or inflammation. No biliary ductal dilatation. Pancreas: No pancreatic ductal dilatation or surrounding inflammatory changes. Spleen: Normal in size. No concerning splenic lesions. Adrenals/Urinary Tract: Normal adrenal glands. Kidneys are normally located with symmetric enhancement and excretion. No suspicious renal lesion, urolithiasis or hydronephrosis. Urinary bladder largely decompressed at the time of exam. No gross bladder abnormality accounting for underdistention. Stomach/Bowel: Distal esophagus, stomach and duodenal sweep are unremarkable. No small bowel wall thickening or dilatation. There are postsurgical changes from prior appendectomy. Segmental thickening of the colon from the splenic flexure through the proximal sigmoid with low attenuation mural thickening and pronounced mucosal hyperemia as well as some surrounding pericolonic hazy stranding. No evidence of bowel obstruction. No extraluminal gas, organized collection or abscess is seen. No pneumatosis or portal venous gas is seen at this time. Vascular/Lymphatic: Atherosclerotic calcifications within the abdominal aorta and branch vessels. No aneurysm or ectasia. No large acute central occlusion is seen within the limitations of this non arterial phase exam. No enlarged abdominopelvic lymph nodes. Reproductive: Anteverted uterus with calcified right uterine body fibroid measuring up  to 1.5 cm. No concerning adnexal lesions. Other: Pericolonic hazy stranding, as above most pronounced in the descending colon. Musculoskeletal: Multilevel degenerative changes are present in the imaged portions of the spine. Vertebral body hemangioma T9. IMPRESSION: 1. Segmental thickening of the colon from the splenic flexure through the proximal sigmoid with low attenuation mural thickening and pronounced mucosal hyperemia as well as some surrounding pericolonic hazy stranding. Findings are consistent with a nonspecific colitis, which could be infectious, inflammatory or vascular in etiology. 2. Cholelithiasis without evidence of acute cholecystitis. 3. Fibroid  uterus. 4. Aortic Atherosclerosis (ICD10-I70.0). Electronically Signed   By: Lovena Le M.D.   On: 03/22/2020 01:30    EKG: Independently reviewed.  Assessment/Plan Principal Problem:   Colitis Active Problems:   Chronic kidney disease, stage 3 (H[facility])    1. Colitis - 1. Infectious, inflammatory, or ischemic 2. Will treat as infectious colitis empirically for the moment 3. GI pathogen pnl ordered 4. Got cipro/flagyl in ED 5. Will switch to rocephin / flagyl (instead of using cipro in a CKD patient). 6. Morphine PRN pain 7. Consider GI consult if symptoms persist / GI pathogen pnl is un diagnostic. 8. Repeat labs in AM 2. BRBPR - 1. Hemoglobin stable initially in ED 2. Repeat this AM is pending 3. Type and screen 3. CKD stage 3 - 1. Actually creat and BUN are normal today  DVT prophylaxis: SCDs Code Status: Full Family Communication: Family at bedside Disposition Plan: Home after colitis improved Consults called: None Admission status: Place in 31   [place], [place] Hospitalists  How to contact the [facility] Attending or Consulting provider [place] or covering provider during after hours [place], for this patient?  1. Check the care team in [facility] and look for a) attending/consulting [facility] provider listed and b) the [facility]  team listed 2. Log into [email or web address]  Amion Physician Scheduling and messaging for groups and whole hospitals  On call and physician scheduling software for group practices, residents, hospitalists and other medical providers for call, clinic, rotation and shift schedules. On[employer] is a hospital-wide system for scheduling doctors and paging doctors on call. EasyPlot is for scientific plotting and data analysis.  [email or web address]  and use [place]'s universal password to access. If you do not have the password, please contact the hospital operator.  3. Locate the [facility] provider you are looking for under [facility] and page to a number that you can be directly reached. 4. If you still have difficulty reaching the provider, please page the [facility] (Director on Call) for the Hospitalists listed on amion for assistance.  03/22/2020, 5:12 AM

## 2020-03-22 NOTE — ED Notes (Signed)
Pt nausea continues, no other PRN medications ordered, pt unable to tolerate anything PO at this time, will page admitting MD.

## 2020-03-22 NOTE — Progress Notes (Signed)
PROGRESS NOTE    Amanda [name]  Amanda [name] DOB: 1960/02/16 DOA: 03/21/2020 PCP: Antony Contras, MD    Brief Narrative:  60 y.o. female with medical history significant of CKD 3, HTN, HLD. Pt presents to the ED for c/o abd pain and BRBPR.  Pt woke up from sleep about 20h PTA with diffuse lower abd pain.  Initially had episode of diarrhea without any blood however subsequent episodes of diarrhea have bright red blood in them. In the ED, pt underwent CT abd with findings suspicious for segmental thickening of the colon from splenic flexure through prox sigmoid consistent with nonspecific colitis.   Assessment & Plan:   Principal Problem:   Colitis Active Problems:   Chronic kidney disease, stage 3 ([place])  1. Colitis - 1. Pt is continued on empiric rocephin with flagyl 2. Continue with analgesia as needed 3. This AM, unable to tolerate any significant PO intake, but was willing to try clears. Have downgraded diet to clears 4. Would advance diet as tolerated over time 2. BRBPR - 1. Hemoglobin stable initially in ED 2. Suspect secondary to acute colitis per above 3. Last episode was while pt was in ED, no further episodes this AM 4. Repeat cbc in AM 3. CKD stage 3 - 1. Renal function stable 2. Repeat bmet in AM  DVT prophylaxis: SCD's Code Status: Full Family Communication: Pt in room, family at bedside  Status is: Observation  The patient will require care spanning > 2 midnights and should be moved to inpatient because: IV treatments appropriate due to intensity of illness or inability to take PO and Inpatient level of care appropriate due to severity of illness  Dispo: The patient is from: Home              Anticipated d/c is to: Home              Anticipated d/c date is: 2 days              Patient currently is not medically stable to d/c.   Difficult to place patient No       Consultants:     Procedures:     Antimicrobials: Anti-infectives (From  admission, onward)   Start     Dose/Rate Route Frequency Ordered Stop   03/22/20 1200  metroNIDAZOLE (FLAGYL) IVPB 500 mg        500 mg 100 mL/hr over 60 Minutes Intravenous Every 8 hours 03/22/20 0521     03/22/20 1000  cefTRIAXone (ROCEPHIN) 2 g in sodium chloride 0.9 % 100 mL IVPB        2 g 200 mL/hr over 30 Minutes Intravenous Every 24 hours 03/22/20 0521     03/22/20 0200  ciprofloxacin (CIPRO) IVPB 400 mg       "And" Linked Group Details   400 mg 200 mL/hr over 60 Minutes Intravenous  Once 03/22/20 0151 03/22/20 0418   03/22/20 0200  metroNIDAZOLE (FLAGYL) IVPB 500 mg       "And" Linked Group Details   500 mg 100 mL/hr over 60 Minutes Intravenous  Once 03/22/20 0151 03/22/20 0418       Subjective: Feeling nauseated this AM, willing to try clears  Objective: Vitals:   03/22/20 1000 03/22/20 1100 03/22/20 1200 03/22/20 1300  BP: 136/82 122/69 135/80 131/73  Pulse: 85 90 (!) 103 97  Resp: 14 15 (!) 21 19  Temp:      TempSrc:      SpO2: 97% 91%  97% 93%  Weight:      Height:        Intake/Output Summary (Last 24 hours) at 03/22/2020 1712 Last data filed at 03/22/2020 1335 Gross per 24 hour  Intake 1300.25 ml  Output --  Net 1300.25 ml   Filed Weights   03/22/20 0726  Weight: 68 kg    Examination:  General exam: Appears calm and, appears uncomfortable Respiratory system: Clear to auscultation. Respiratory effort normal. Cardiovascular system: S1 & S2 heard, Regular Gastrointestinal system: Abdomen is nondistended, soft, tenderness in lower quadrants Central nervous system: Alert and oriented. No focal neurological deficits. Extremities: Symmetric 5 x 5 power. Skin: No rashes, lesions Psychiatry: Judgement and insight appear normal. Mood & affect appropriate.   Data Reviewed: I have personally reviewed following labs and imaging studies  CBC: Recent Labs  Lab 03/21/20 1720 03/22/20 0042 03/22/20 0502  WBC 13.0*  --  9.0  HGB 14.2 14.3 12.3  HCT 42.6  40.8 37.8  MCV 89.1  --  91.5  PLT 353  --  458   Basic Metabolic Panel: Recent Labs  Lab 03/21/20 1720 03/22/20 0502  NA 135 138  K 3.5 4.0  CL 102 105  CO2 22 24  GLUCOSE 121* 104*  BUN 12 11  CREATININE 1.07* 1.05*  CALCIUM 9.7 8.8*   GFR: Estimated Creatinine Clearance: 52.2 mL/min (A) (by C-G formula based on SCr of 1.05 mg/dL (H)). Liver Function Tests: Recent Labs  Lab 03/21/20 1720  AST 23  ALT 23  ALKPHOS 94  BILITOT 0.8  PROT 7.5  ALBUMIN 4.0   Recent Labs  Lab 03/21/20 1720  LIPASE 33   No results for input(s): AMMONIA in the last 168 hours. Coagulation Profile: No results for input(s): INR, PROTIME in the last 168 hours. Cardiac Enzymes: No results for input(s): CKTOTAL, CKMB, CKMBINDEX, TROPONINI in the last 168 hours. BNP (last 3 results) No results for input(s): PROBNP in the last 8760 hours. HbA1C: No results for input(s): HGBA1C in the last 72 hours. CBG: No results for input(s): GLUCAP in the last 168 hours. Lipid Profile: No results for input(s): CHOL, HDL, LDLCALC, TRIG, CHOLHDL, LDLDIRECT in the last 72 hours. Thyroid Function Tests: No results for input(s): TSH, T4TOTAL, FREET4, T3FREE, THYROIDAB in the last 72 hours. Anemia Panel: No results for input(s): VITAMINB12, FOLATE, FERRITIN, TIBC, IRON, RETICCTPCT in the last 72 hours. Sepsis Labs: No results for input(s): PROCALCITON, LATICACIDVEN in the last 168 hours.  Recent Results (from the past 240 hour(s))  Resp Panel by RT-PCR (Flu A&B, Covid) Nasopharyngeal Swab     Status: None   Collection Time: 03/22/20  3:16 AM   Specimen: Nasopharyngeal Swab; Nasopharyngeal(NP) swabs in vial transport medium  Result Value Ref Range Status   SARS Coronavirus 2 by RT PCR NEGATIVE NEGATIVE Final    Comment: (NOTE) SARS-CoV-2 target nucleic acids are NOT DETECTED.  The SARS-CoV-2 RNA is generally detectable in upper respiratory specimens during the acute phase of infection. The  lowest concentration of SARS-CoV-2 viral copies this assay can detect is 138 copies/mL. A negative result does not preclude SARS-Cov-2 infection and should not be used as the sole basis for treatment or other patient management decisions. A negative result may occur with  improper specimen collection/handling, submission of specimen other than nasopharyngeal swab, presence of viral mutation(s) within the areas targeted by this assay, and inadequate number of viral copies(<138 copies/mL). A negative result must be combined with clinical observations, patient history, and epidemiological  information. The expected result is Negative.  Fact Sheet for Patients:  EntrepreneurPulse.com.au  Fact Sheet for Healthcare Providers:  IncredibleEmployment.be  This test is no t yet approved or cleared by the [place] FDA and  has been authorized for detection and/or diagnosis of SARS-CoV-2 by FDA under an Emergency Use Authorization (EUA). This EUA will remain  in effect (meaning this test can be used) for the duration of the COVID-19 declaration under Section 564(b)(1) of the Act, 21 U.S.C.section 360bbb-3(b)(1), unless the authorization is terminated  or revoked sooner.       Influenza A by PCR NEGATIVE NEGATIVE Final   Influenza B by PCR NEGATIVE NEGATIVE Final    Comment: (NOTE) The Xpert Xpress SARS-CoV-2/FLU/RSV plus assay is intended as an aid in the diagnosis of influenza from Nasopharyngeal swab specimens and should not be used as a sole basis for treatment. Nasal washings and aspirates are unacceptable for Xpert Xpress SARS-CoV-2/FLU/RSV testing.  Fact Sheet for Patients: EntrepreneurPulse.com.au  Fact Sheet for Healthcare Providers: IncredibleEmployment.be  This test is not yet approved or cleared by the [place] FDA and has been authorized for detection and/or diagnosis of SARS-CoV-2 by FDA under  an Emergency Use Authorization (EUA). This EUA will remain in effect (meaning this test can be used) for the duration of the COVID-19 declaration under Section 564(b)(1) of the Act, 21 U.S.C. section 360bbb-3(b)(1), unless the authorization is terminated or revoked.  Performed at [facility], [place] [address], [place], [place] [address]      Radiology Studies: CT Abdomen Pelvis W Contrast  Result Date: 03/22/2020 CLINICAL DATA:  Left lower quadrant pain, rectal bleeding EXAM: CT ABDOMEN AND PELVIS WITH CONTRAST TECHNIQUE: Multidetector CT imaging of the abdomen and pelvis was performed using the standard protocol following bolus administration of intravenous contrast. CONTRAST:  119mL OMNIPAQUE IOHEXOL 300 MG/ML  SOLN COMPARISON:  Renal ultrasound 12/01/2019 FINDINGS: Lower chest: Lung bases are clear. Normal heart size. No pericardial effusion. Hepatobiliary: Hypoattenuation of the liver parenchyma may be related to contrast timing with a more normal appearance on delayed phase imaging. No concerning liver lesion. Smooth liver surface contour. Largely calcified 1.6 cm gallstone noted in the neck of the gallbladder. No peri or cystic fluid or inflammation. No biliary ductal dilatation. Pancreas: No pancreatic ductal dilatation or surrounding inflammatory changes. Spleen: Normal in size. No concerning splenic lesions. Adrenals/Urinary Tract: Normal adrenal glands. Kidneys are normally located with symmetric enhancement and excretion. No suspicious renal lesion, urolithiasis or hydronephrosis. Urinary bladder largely decompressed at the time of exam. No gross bladder abnormality accounting for underdistention. Stomach/Bowel: Distal esophagus, stomach and duodenal sweep are unremarkable. No small bowel wall thickening or dilatation. There are postsurgical changes from prior appendectomy. Segmental thickening of the colon from the splenic flexure through the proximal sigmoid with low attenuation mural  thickening and pronounced mucosal hyperemia as well as some surrounding pericolonic hazy stranding. No evidence of bowel obstruction. No extraluminal gas, organized collection or abscess is seen. No pneumatosis or portal venous gas is seen at this time. Vascular/Lymphatic: Atherosclerotic calcifications within the abdominal aorta and branch vessels. No aneurysm or ectasia. No large acute central occlusion is seen within the limitations of this non arterial phase exam. No enlarged abdominopelvic lymph nodes. Reproductive: Anteverted uterus with calcified right uterine body fibroid measuring up to 1.5 cm. No concerning adnexal lesions. Other: Pericolonic hazy stranding, as above most pronounced in the descending colon. Musculoskeletal: Multilevel degenerative changes are present in the imaged portions of the  spine. Vertebral body hemangioma T9. IMPRESSION: 1. Segmental thickening of the colon from the splenic flexure through the proximal sigmoid with low attenuation mural thickening and pronounced mucosal hyperemia as well as some surrounding pericolonic hazy stranding. Findings are consistent with a nonspecific colitis, which could be infectious, inflammatory or vascular in etiology. 2. Cholelithiasis without evidence of acute cholecystitis. 3. Fibroid uterus. 4. Aortic Atherosclerosis (ICD10-I70.0). Electronically Signed   By: Lovena Le M.D.   On: 03/22/2020 01:30    Scheduled Meds: . ALPRAZolam  1 mg Oral QHS  . escitalopram  20 mg Oral Daily  . metoprolol tartrate  25 mg Oral BID  . vitamin B-12  1,000 mcg Oral Daily   Continuous Infusions: . cefTRIAXone (ROCEPHIN)  IV Stopped (03/22/20 1144)  . metronidazole Stopped (03/22/20 1335)     LOS: 0 days   Marylu Lund, MD [facility] Pager On Amion  If 7PM-7AM, please contact night-coverage 03/22/2020, 5:12 PM

## 2020-03-22 NOTE — ED Provider Notes (Signed)
[place]   [id number] 03/21/20 Arrival Time: 0093  ASSESSMENT & PLAN:  1. Left lower quadrant abdominal pain   2. Bloody diarrhea     Considerable TTP over LLQ. No previous dx of diverticulitis or diverticulosis. Mildly tachycardic. To ED for evaluation and imaging. Prefers private vehicle; stable upon discharge.    Follow-up Information    Go to  [facility].   Specialty: Emergency Medicine Contact information: [address] [id number] [place] [phone]              Reviewed expectations re: course of current medical issues. Questions answered. Outlined signs and symptoms indicating need for more acute intervention. Patient verbalized understanding. After Visit Summary given.   SUBJECTIVE: History from: patient. Amanda [name] is a 60 y.o. female who presents with complaint of fairly persistent non-radiating lower abd pain; more so over LLQ. First noted this am. Associated diarrhea with bright red blood. Without n/v. Tolerating PO intake. Pain worsens with certain movements. No vaginal discharge or bleeding. No urinary symptoms. Without back pain. Patient's last menstrual period was 03/23/2011.  Past Surgical History:  Procedure Laterality Date  . APPENDECTOMY    . CARPAL TUNNEL RELEASE     rt  . FOOT SURGERY Bilateral    Plantar fasciitis  . OOPHORECTOMY       OBJECTIVE:  Vitals:   03/21/20 1526 03/21/20 1531  BP:  (!) 148/88  Pulse:  (!) 113  Resp:  18  Temp:  97.8 F (36.6 C)  TempSrc:  Oral  SpO2:  100%  Weight: 68 kg   Height: 5\' 2"  (1.575 m)     Slight tachycardia noted; regular  General appearance: alert, oriented, no acute distress HEENT: [place]; AT; oropharynx moist Lungs: unlabored respirations Abdomen: soft; without distention; poorly localized tenderness to palpation over LLQ; without masses or organomegaly; without frank guarding or rebound  tenderness but does pull away with exam Back: without reported CVA tenderness; FROM at waist Extremities: without LE edema; symmetrical; without gross deformities Skin: warm and dry Neurologic: normal gait Psychological: alert and cooperative; normal mood and affect  Labs: Results for orders placed or performed during the hospital encounter of 03/21/20  POC Urinalysis dipstick  Result Value Ref Range   Glucose, UA NEGATIVE NEGATIVE mg/dL   Bilirubin Urine NEGATIVE NEGATIVE   Ketones, ur NEGATIVE NEGATIVE mg/dL   Specific Gravity, Urine >=1.030 1.005 - 1.030   Hgb urine dipstick TRACE (A) NEGATIVE   pH 5.0 5.0 - 8.0   Protein, ur NEGATIVE NEGATIVE mg/dL   Urobilinogen, UA 0.2 0.0 - 1.0 mg/dL   Nitrite NEGATIVE NEGATIVE   Leukocytes,Ua NEGATIVE NEGATIVE   Labs Reviewed  POCT URINALYSIS DIPSTICK, ED / UC - Abnormal; Notable for the following components:      Result Value   Hgb urine dipstick TRACE (*)    All other components within normal limits     Allergies  Allergen Reactions  . Ace Inhibitors     REACTION: cough  . Ambien [Zolpidem]     Other reaction(s): headache/neck thumping  . Amoxicillin     Gi upset  . Codeine Nausea And Vomiting    Can tolerate hydrocodone if has food on stomach Can tolerate hydrocodone if has food on stomach   . Cortisone     Extremely high BP  . Crestor [Rosuvastatin]     Other reaction(s): facial rash  . Felodipine     Other reaction(s): increased  flushing and made her feel bad  . Hydrocodone-Acetaminophen     REACTION: vomitting  . Lisinopril Cough  . Losartan Potassium     REACTION: cough  . Statins     Face and leg rash with rosuvastatin, pravastatin, and atorvastatin  . Venlafaxine     Other reaction(s): headaches/elevated blood pressure  . Zetia [Ezetimibe] Diarrhea                                               Past Medical History:  Diagnosis Date  . Anxiety   . Asthma   . Chronic kidney disease   . Chronic kidney  disease, stage 3 ([place]) 08/10/2019  . Depression   . Hyperlipidemia   . Hypertension   . Migraine     Social History   Socioeconomic History  . Marital status: Married    Spouse name: Not on file  . Number of children: 1  . Years of education: Not on file  . Highest education level: Not on file  Occupational History  . Occupation: Aeronautical engineer: [employer]  Tobacco Use  . Smoking status: Former Smoker    Packs/day: 0.50    Years: 5.00    Pack years: 2.50    Types: Cigarettes    Quit date: 01/29/1987    Years since quitting: 33.1  . Smokeless tobacco: Never Used  Substance and Sexual Activity  . Alcohol use: Yes    Comment: social ETOH only  . Drug use: No  . Sexual activity: Not on file  Other Topics Concern  . Not on file  Social History Narrative  . Not on file   Social Determinants of Health   Financial Resource Strain: Not on file  Food Insecurity: Not on file  Transportation Needs: Not on file  Physical Activity: Not on file  Stress: Not on file  Social Connections: Not on file  Intimate Partner Violence: Not on file    Family History  Problem Relation Age of Onset  . Asthma Father   . Heart disease Father        CAD s/p CABG  . Elevated Lipids Father   . Depression Father   . Asthma Sister   . Elevated Lipids Sister   . Hypertension Sister   . Depression Sister   . Asthma Paternal Aunt   . Elevated Lipids Mother   . Elevated Lipids Sister   . Hypertension Sister   . Bipolar disorder Sister   . Elevated Lipids Sister   . Hypertension Sister   . Depression Sister      Vanessa Kick, MD 03/22/20 [phone]

## 2020-03-22 NOTE — ED Notes (Signed)
Tele Breakfast Order Placed

## 2020-03-22 NOTE — ED Provider Notes (Signed)
[facility] EMERGENCY DEPARTMENT Provider Note   CSN: [id number] Arrival date & time: 03/21/20  1642     History Chief Complaint  Patient presents with  . Abdominal Pain  . Rectal Bleeding    Amanda [name] is a 60 y.o. female with past medical history significant for CKD, hypertension, hyperlipidemia who presents for evaluation abdominal pain and rectal bleeding.  Woke her from sleep approximately 20 hours PTA.  Pain located diffusely across lower abdomen however worse the left lower quadrant.  Initially had episode of diarrhea without any blood however states subsequent episodes of diarrhea of bright red blood in them.  Patient states now when she does have a bowel movement she is no longer passing stool however is only passing bright red blood with some small clots.  Last colonoscopy approximately 10 years ago, was seen by Dr. Collene Mares at that time.  Not currently followed by GI.  She is followed by [facility] PCP.  Contacted him earlier today, recommended coming to urgent care or ED for evaluation.  Patient states she has had some persistent nausea.  Rates her current pain a 7/10.  Worse with palpating her abdomen.  Had some lightheadedness earlier today however none currently.  Patient states with her prior colonoscopy she was only told she had internal and external hemorrhoids however she does not note any known history of diverticulosis or diverticulitis.  Has had some rectal bleeding previously with bleeding with hemorrhoids however states this feels different.  Denies additional aggravating or alleviating factors.  No fever, chills, chest pain, shortness of breath, dysuria, hematuria.  Patient does states she feels generally fatigued and weak however states she has not had anything to eat today and is only had some small sips of liquids this morning.  Denies any gross rectal pain however does states she has a feeling of tenesmus. Patient with greater than 20 episodes of BRBPR since  initial onset.  History obtained from patient and past medical records.  No interpreter used.  HPI     Past Medical History:  Diagnosis Date  . Anxiety   . Asthma   . Chronic kidney disease   . Chronic kidney disease, stage 3 ([place]) 08/10/2019  . Depression   . Hyperlipidemia   . Hypertension   . Migraine     Patient Active Problem List   Diagnosis Date Noted  . Chronic kidney disease, stage 3 ([place]) 08/10/2019  . Anxiety 02/11/2018  . Sleep phase syndrome, delayed 02/11/2018  . Chronic insomnia 02/11/2018  . Plantar fasciitis, bilateral 09/18/2017  . Hyperlipidemia 07/28/2017  . Pain of breast 05/20/2017  . Capsulitis of ankle or foot 03/23/2014  . Cough 03/21/2011  . Asthma 03/20/2011  . HEADACHE 10/12/2007  . ANXIETY 09/07/2007  . PALPITATIONS 09/04/2007  . UNSPECIFIED ADJUSTMENT REACTION 08/28/2007  . HYPERTENSION 08/28/2007  . LEUKOCYTOSIS UNSPECIFIED 08/13/2007  . TRIGGER FINGER 08/13/2007  . BACK PAIN, ACUTE 07/01/2007  . POLYURIA 07/01/2007  . MIGRAINE HEADACHE 07/04/2006  . ELEVATED BLOOD PRESSURE WITHOUT DIAGNOSIS OF HYPERTENSION 07/04/2006    Past Surgical History:  Procedure Laterality Date  . APPENDECTOMY    . CARPAL TUNNEL RELEASE     rt  . FOOT SURGERY Bilateral    Plantar fasciitis  . OOPHORECTOMY       OB History   No obstetric history on file.     Family History  Problem Relation Age of Onset  . Asthma Father   . Heart disease Father  CAD s/p CABG  . Elevated Lipids Father   . Depression Father   . Asthma Sister   . Elevated Lipids Sister   . Hypertension Sister   . Depression Sister   . Asthma Paternal Aunt   . Elevated Lipids Mother   . Elevated Lipids Sister   . Hypertension Sister   . Bipolar disorder Sister   . Elevated Lipids Sister   . Hypertension Sister   . Depression Sister     Social History   Tobacco Use  . Smoking status: Former Smoker    Packs/day: 0.50    Years: 5.00    Pack years: 2.50     Types: Cigarettes    Quit date: 01/29/1987    Years since quitting: 33.1  . Smokeless tobacco: Never Used  Substance Use Topics  . Alcohol use: Yes    Comment: social ETOH only  . Drug use: No    Home Medications Prior to Admission medications   Medication Sig Start Date End Date Taking? Authorizing Provider  ALPRAZolam (XANAX) 1 MG tablet TAKE 1 TABLET AT BEDTIME AND 1/2-1 TABLET AS NEEDED FOR ANXIETY. 12/15/19   Thayer Headings, PMHNP  Bempedoic Acid (NEXLETOL) 180 MG TABS Take 180 mg by mouth daily. Patient not taking: Reported on 03/16/2020 08/10/19   Imogene Burn, PA-C  diphenhydrAMINE (BENADRYL) 25 mg capsule Take 25 mg by mouth every 6 (six) hours as needed.    [provider]  escitalopram (LEXAPRO) 20 MG tablet Take 1 tablet (20 mg total) by mouth daily. 03/16/20   Thayer Headings, PMHNP  L-Methylfolate-Algae (DEPLIN 15) 15-90.314 MG CAPS Take 15 mg by mouth daily. 12/15/19   Thayer Headings, PMHNP  metoprolol tartrate (LOPRESSOR) 25 MG tablet Take 1 tablet by mouth 2 (two) times daily. 04/16/13   [provider]  rizatriptan (MAXALT) 10 MG tablet rizatriptan 10 mg tablet    [provider]  vitamin B-12 (CYANOCOBALAMIN) 1000 MCG tablet Take 1,000 mcg by mouth.    [provider]    Allergies    Ace inhibitors, Amoxicillin, Codeine, Cortisone, Hydrocodone-acetaminophen, Losartan potassium, Statins, and Zetia [ezetimibe]  Review of Systems   Review of Systems  Constitutional: Negative.   HENT: Negative.   Respiratory: Negative.   Cardiovascular: Negative.   Gastrointestinal: Positive for abdominal pain, blood in stool, diarrhea and nausea.  Genitourinary: Negative.   Musculoskeletal: Negative.   Skin: Negative.   Neurological: Positive for weakness (Generalized) and light-headedness (Earlier, none currently). Negative for dizziness, facial asymmetry and headaches.  All other systems reviewed and are negative.   Physical Exam Updated  Vital Signs BP (!) 147/76   Pulse 96   Temp 99.3 F (37.4 C)   Resp 15   LMP 03/23/2011   SpO2 95%   Physical Exam Vitals and nursing note reviewed.  Constitutional:      General: She is not in acute distress.    Appearance: She is well-developed and well-nourished. She is not ill-appearing, toxic-appearing or diaphoretic.  HENT:     Head: Atraumatic.     Mouth/Throat:     Mouth: Mucous membranes are moist.  Eyes:     Pupils: Pupils are equal, round, and reactive to light.  Cardiovascular:     Rate and Rhythm: Normal rate.     Pulses: Intact distal pulses.     Heart sounds: Normal heart sounds.  Pulmonary:     Effort: Pulmonary effort is normal. No respiratory distress.     Breath sounds: Normal breath  sounds.  Abdominal:     General: There is no distension.     Palpations: Abdomen is soft.     Tenderness: There is abdominal tenderness in the right lower quadrant, suprapubic area and left lower quadrant. There is guarding. There is no rebound. Negative signs include Murphy's sign and McBurney's sign.     Hernia: No hernia is present.     Comments: Generalized tenderness to lower abdomen, worse left lower quadrant.  There is guarding, no rebound.  Genitourinary:    Rectum: Guaiac result positive. External hemorrhoid and internal hemorrhoid present. No tenderness.     Comments: Occult positive External old hemorrhoid skin tag Musculoskeletal:        General: Normal range of motion.     Cervical back: Normal range of motion.  Skin:    General: Skin is warm and dry.     Capillary Refill: Capillary refill takes less than 2 seconds.  Neurological:     General: No focal deficit present.     Mental Status: She is alert and oriented to person, place, and time.  Psychiatric:        Mood and Affect: Mood and affect normal.    ED Results / Procedures / Treatments   Labs (all labs ordered are listed, but only abnormal results are displayed) Labs Reviewed  COMPREHENSIVE  METABOLIC PANEL - Abnormal; Notable for the following components:      Result Value   Glucose, Bld 121 (*)    Creatinine, Ser 1.07 (*)    GFR, Estimated 60 (*)    All other components within normal limits  CBC - Abnormal; Notable for the following components:   WBC 13.0 (*)    All other components within normal limits  URINALYSIS, ROUTINE W REFLEX MICROSCOPIC - Abnormal; Notable for the following components:   APPearance TURBID (*)    Bacteria, UA FEW (*)    All other components within normal limits  POC OCCULT BLOOD, ED - Abnormal; Notable for the following components:   Fecal Occult Bld POSITIVE (*)    All other components within normal limits  RESP PANEL BY RT-PCR (FLU A&B, COVID) ARPGX2  LIPASE, BLOOD  HEMOGLOBIN AND HEMATOCRIT, BLOOD    EKG None  Radiology CT Abdomen Pelvis W Contrast  Result Date: 03/22/2020 CLINICAL DATA:  Left lower quadrant pain, rectal bleeding EXAM: CT ABDOMEN AND PELVIS WITH CONTRAST TECHNIQUE: Multidetector CT imaging of the abdomen and pelvis was performed using the standard protocol following bolus administration of intravenous contrast. CONTRAST:  140mL OMNIPAQUE IOHEXOL 300 MG/ML  SOLN COMPARISON:  Renal ultrasound 12/01/2019 FINDINGS: Lower chest: Lung bases are clear. Normal heart size. No pericardial effusion. Hepatobiliary: Hypoattenuation of the liver parenchyma may be related to contrast timing with a more normal appearance on delayed phase imaging. No concerning liver lesion. Smooth liver surface contour. Largely calcified 1.6 cm gallstone noted in the neck of the gallbladder. No peri or cystic fluid or inflammation. No biliary ductal dilatation. Pancreas: No pancreatic ductal dilatation or surrounding inflammatory changes. Spleen: Normal in size. No concerning splenic lesions. Adrenals/Urinary Tract: Normal adrenal glands. Kidneys are normally located with symmetric enhancement and excretion. No suspicious renal lesion, urolithiasis or  hydronephrosis. Urinary bladder largely decompressed at the time of exam. No gross bladder abnormality accounting for underdistention. Stomach/Bowel: Distal esophagus, stomach and duodenal sweep are unremarkable. No small bowel wall thickening or dilatation. There are postsurgical changes from prior appendectomy. Segmental thickening of the colon from the splenic flexure through the proximal  sigmoid with low attenuation mural thickening and pronounced mucosal hyperemia as well as some surrounding pericolonic hazy stranding. No evidence of bowel obstruction. No extraluminal gas, organized collection or abscess is seen. No pneumatosis or portal venous gas is seen at this time. Vascular/Lymphatic: Atherosclerotic calcifications within the abdominal aorta and branch vessels. No aneurysm or ectasia. No large acute central occlusion is seen within the limitations of this non arterial phase exam. No enlarged abdominopelvic lymph nodes. Reproductive: Anteverted uterus with calcified right uterine body fibroid measuring up to 1.5 cm. No concerning adnexal lesions. Other: Pericolonic hazy stranding, as above most pronounced in the descending colon. Musculoskeletal: Multilevel degenerative changes are present in the imaged portions of the spine. Vertebral body hemangioma T9. IMPRESSION: 1. Segmental thickening of the colon from the splenic flexure through the proximal sigmoid with low attenuation mural thickening and pronounced mucosal hyperemia as well as some surrounding pericolonic hazy stranding. Findings are consistent with a nonspecific colitis, which could be infectious, inflammatory or vascular in etiology. 2. Cholelithiasis without evidence of acute cholecystitis. 3. Fibroid uterus. 4. Aortic Atherosclerosis (ICD10-I70.0). Electronically Signed   By: Lovena Le M.D.   On: 03/22/2020 01:30    Procedures Procedures   Medications Ordered in ED Medications  ciprofloxacin (CIPRO) IVPB 400 mg (has no  administration in time range)    And  metroNIDAZOLE (FLAGYL) IVPB 500 mg (has no administration in time range)  sodium chloride 0.9 % bolus 1,000 mL (1,000 mLs Intravenous New Bag/Given 03/22/20 0059)  ondansetron (ZOFRAN) injection 4 mg (4 mg Intravenous Given 03/22/20 0059)  morphine 4 MG/ML injection 4 mg (4 mg Intravenous Given 03/22/20 0100)  iohexol (OMNIPAQUE) 300 MG/ML solution 100 mL (100 mLs Intravenous Contrast Given 03/22/20 0121)    ED Course  I have reviewed the triage vital signs and the nursing notes.  Pertinent labs & imaging results that were available during my care of the patient were reviewed by me and considered in my medical decision making (see chart for details).  Patient with lower abdominal pain, diarrhea number of blood per rectum.  Afebrile, nonseptic, non-ill-appearing.  Abdomen diffusely tender over his left lower quadrant.  Last colonoscopy greater than 10 years ago.  Not followed by GI.  Initially had some lightheadedness and dizziness however this resolved.  Apparently greater than 20 episodes of bloody stools since onset.  We will plan on labs, imaging and reassess  Labs and imaging personally reviewed and interpreted:  CBC mild leukocytosis at 23.5 Metabolic panel with creatinine 1.07, glucose 121, GFR 60 Lipase 33 UA negative for infection Occult positive Repeat H&H 14.3 CT AP with colitis, unclear etiology, possibly inflammatory versus vascular versus infectious  Patient reassessed. Pain improved.  Discussed CT findings, stable hemoglobin which is reassuring.  Given significant bowel movements with blood in frequency recommend overnight observation for pain control, trending H&H, IV Abx,  likely GI consult in the morning.  She is agreeable for this.  CONSULT with Dr.Gardner with [facility] who agrees to evaluate patient for admission.  The patient appears reasonably stabilized for admission considering the current resources, flow, and capabilities available in  the ED at this time, and I doubt any other [facility] requiring further screening and/or treatment in the ED prior to admission.    MDM Rules/Calculators/A&P                           Final Clinical Impression(s) / ED Diagnoses Final diagnoses:  Colitis  Rectal  bleeding    Rx / DC Orders ED Discharge Orders    None       Jarvis Sawa A, PA-C 03/22/20 0540    Orpah Greek, MD 03/22/20 [phone]

## 2020-03-22 NOTE — ED Notes (Signed)
Nausea improved, pt given ginger ale, able to tolerate sips

## 2020-03-23 LAB — CBC
HCT: 38.1 % (ref 36.0–46.0)
Hemoglobin: 12.6 g/dL (ref 12.0–15.0)
MCH: 29.5 pg (ref 26.0–34.0)
MCHC: 33.1 g/dL (ref 30.0–36.0)
MCV: 89.2 fL (ref 80.0–100.0)
Platelets: 305 10*3/uL (ref 150–400)
RBC: 4.27 MIL/uL (ref 3.87–5.11)
RDW: 12.3 % (ref 11.5–15.5)
WBC: 12.4 10*3/uL — ABNORMAL HIGH (ref 4.0–10.5)
nRBC: 0 % (ref 0.0–0.2)

## 2020-03-23 LAB — COMPREHENSIVE METABOLIC PANEL
ALT: 20 U/L (ref 0–44)
AST: 17 U/L (ref 15–41)
Albumin: 3.2 g/dL — ABNORMAL LOW (ref 3.5–5.0)
Alkaline Phosphatase: 73 U/L (ref 38–126)
Anion gap: 9 (ref 5–15)
BUN: 10 mg/dL (ref 6–20)
CO2: 23 mmol/L (ref 22–32)
Calcium: 9.2 mg/dL (ref 8.9–10.3)
Chloride: 105 mmol/L (ref 98–111)
Creatinine, Ser: 0.94 mg/dL (ref 0.44–1.00)
GFR, Estimated: >60 mL/min (ref >60)
Glucose, Bld: 113 mg/dL — ABNORMAL HIGH (ref 70–99)
Potassium: 4.1 mmol/L (ref 3.5–5.1)
Sodium: 137 mmol/L (ref 135–145)
Total Bilirubin: 0.5 mg/dL (ref 0.3–1.2)
Total Protein: 5.9 g/dL — ABNORMAL LOW (ref 6.5–8.1)

## 2020-03-23 NOTE — Progress Notes (Signed)
PROGRESS NOTE    Amanda [name]  PTW:[id number] DOB: [birth year] DOA: 03/21/2020 PCP: Antony Contras, MD    Brief Narrative:  60 y.o. female with medical history significant of CKD 3, HTN, HLD. Pt presents to the ED for c/o abd pain and BRBPR.  Pt woke up from sleep about 20h PTA with diffuse lower abd pain.  Initially had episode of diarrhea without any blood however subsequent episodes of diarrhea have bright red blood in them. In the ED, pt underwent CT abd with findings suspicious for segmental thickening of the colon from splenic flexure through prox sigmoid consistent with nonspecific colitis.   Assessment & Plan:   Principal Problem:   Colitis Active Problems:   Chronic kidney disease, stage 3 ([place])  1. Colitis - 1. Pt is continued on empiric rocephin with flagyl 2. Continue with analgesia as needed 3. Now tolerating clears, will advance to full liquid today 4. Denies florid diarrhea or blood per rectum today 2. BRBPR - 1. Hemoglobin stable initially in ED 2. Suspect secondary to acute colitis per above 3. Seems resolved now 4. Last episode was while pt was in ED, no further episodes this AM 5. Repeat cbc in AM 6. Recommend f/u with GI after d/c. May need repeat colonoscopy in several months. Last colon was around 56yrs ago by Dr. Collene Mares. Pt states she prefers to f/u with another gastroenterologist 3. CKD stage 3 1. Renal function stable with GFR>30 2. Repeat bmet in AM  DVT prophylaxis: SCD's Code Status: Full Family Communication: Pt in room, family at bedside  Status is: Inpatient  The patient will require continued inpatient stay because: IV treatments appropriate due to intensity of illness or inability to take PO and Inpatient level of care appropriate due to severity of illness  Dispo: The patient is from: Home              Anticipated d/c is to: Home              Anticipated d/c date is: 2 days              Patient currently is not medically stable to d/c.    Difficult to place patient No       Consultants:     Procedures:     Antimicrobials: Anti-infectives (From admission, onward)   Start     Dose/Rate Route Frequency Ordered Stop   03/22/20 1200  metroNIDAZOLE (FLAGYL) IVPB 500 mg        500 mg 100 mL/hr over 60 Minutes Intravenous Every 8 hours 03/22/20 0521     03/22/20 1000  cefTRIAXone (ROCEPHIN) 2 g in sodium chloride 0.9 % 100 mL IVPB        2 g 200 mL/hr over 30 Minutes Intravenous Every 24 hours 03/22/20 0521     03/22/20 0200  ciprofloxacin (CIPRO) IVPB 400 mg       "And" Linked Group Details   400 mg 200 mL/hr over 60 Minutes Intravenous  Once 03/22/20 0151 03/22/20 0418   03/22/20 0200  metroNIDAZOLE (FLAGYL) IVPB 500 mg       "And" Linked Group Details   500 mg 100 mL/hr over 60 Minutes Intravenous  Once 03/22/20 0151 03/22/20 0418      Subjective: Reports feeling better today. Eager to start to advance diet  Objective: Vitals:   03/23/20 0015 03/23/20 0400 03/23/20 0731 03/23/20 1201  BP: 134/88 (!) 148/84 (!) 154/78 (!) 164/83  Pulse: 85  86 78  Resp: 14 19 20 20   Temp: 98.2 F (36.8 C) 98.1 F (36.7 C) 98.2 F (36.8 C) (!) 97.4 F (36.3 C)  TempSrc: Oral Oral Oral Oral  SpO2: 93% 92% 95% 96%  Weight:      Height:        Intake/Output Summary (Last 24 hours) at 03/23/2020 1644 Last data filed at 03/23/2020 1040 Gross per 24 hour  Intake 574.78 ml  Output 500 ml  Net 74.78 ml   Filed Weights   03/22/20 0726  Weight: 68 kg    Examination: General exam: Awake, laying in bed, in nad Respiratory system: Normal respiratory effort, no wheezing Cardiovascular system: regular rate, s1, s2 Gastrointestinal system: Soft, nondistended, positive BS Central nervous system: CN2-12 grossly intact, strength intact Extremities: Perfused, no clubbing Skin: Normal skin turgor, no notable skin lesions seen Psychiatry: Mood normal // no visual hallucinations   Data Reviewed: I have personally reviewed  following labs and imaging studies  CBC: Recent Labs  Lab 03/21/20 1720 03/22/20 0042 03/22/20 0502 03/23/20 0032  WBC 13.0*  --  9.0 12.4*  HGB 14.2 14.3 12.3 12.6  HCT 42.6 40.8 37.8 38.1  MCV 89.1  --  91.5 89.2  PLT 353  --  281 106   Basic Metabolic Panel: Recent Labs  Lab 03/21/20 1720 03/22/20 0502 03/23/20 0032  NA 135 138 137  K 3.5 4.0 4.1  CL 102 105 105  CO2 22 24 23   GLUCOSE 121* 104* 113*  BUN 12 11 10   CREATININE 1.07* 1.05* 0.94  CALCIUM 9.7 8.8* 9.2   GFR: Estimated Creatinine Clearance: 58.3 mL/min (by C-G formula based on SCr of 0.94 mg/dL). Liver Function Tests: Recent Labs  Lab 03/21/20 1720 03/23/20 0032  AST 23 17  ALT 23 20  ALKPHOS 94 73  BILITOT 0.8 0.5  PROT 7.5 5.9*  ALBUMIN 4.0 3.2*   Recent Labs  Lab 03/21/20 1720  LIPASE 33   No results for input(s): AMMONIA in the last 168 hours. Coagulation Profile: No results for input(s): INR, PROTIME in the last 168 hours. Cardiac Enzymes: No results for input(s): CKTOTAL, CKMB, CKMBINDEX, TROPONINI in the last 168 hours. BNP (last 3 results) No results for input(s): PROBNP in the last 8760 hours. HbA1C: No results for input(s): HGBA1C in the last 72 hours. CBG: No results for input(s): GLUCAP in the last 168 hours. Lipid Profile: No results for input(s): CHOL, HDL, LDLCALC, TRIG, CHOLHDL, LDLDIRECT in the last 72 hours. Thyroid Function Tests: No results for input(s): TSH, T4TOTAL, FREET4, T3FREE, THYROIDAB in the last 72 hours. Anemia Panel: No results for input(s): VITAMINB12, FOLATE, FERRITIN, TIBC, IRON, RETICCTPCT in the last 72 hours. Sepsis Labs: No results for input(s): PROCALCITON, LATICACIDVEN in the last 168 hours.  Recent Results (from the past 240 hour(s))  Resp Panel by RT-PCR (Flu A&B, Covid) Nasopharyngeal Swab     Status: None   Collection Time: 03/22/20  3:16 AM   Specimen: Nasopharyngeal Swab; Nasopharyngeal(NP) swabs in vial transport medium  Result Value  Ref Range Status   SARS Coronavirus 2 by RT PCR NEGATIVE NEGATIVE Final    Comment: (NOTE) SARS-CoV-2 target nucleic acids are NOT DETECTED.  The SARS-CoV-2 RNA is generally detectable in upper respiratory specimens during the acute phase of infection. The lowest concentration of SARS-CoV-2 viral copies this assay can detect is 138 copies/mL. A negative result does not preclude SARS-Cov-2 infection and should not be used as the sole basis for treatment or other patient  management decisions. A negative result may occur with  improper specimen collection/handling, submission of specimen other than nasopharyngeal swab, presence of viral mutation(s) within the areas targeted by this assay, and inadequate number of viral copies(<138 copies/mL). A negative result must be combined with clinical observations, patient history, and epidemiological information. The expected result is Negative.  Fact Sheet for Patients:  EntrepreneurPulse.com.au  Fact Sheet for Healthcare Providers:  IncredibleEmployment.be  This test is no t yet approved or cleared by the [place] FDA and  has been authorized for detection and/or diagnosis of SARS-CoV-2 by FDA under an Emergency Use Authorization (EUA). This EUA will remain  in effect (meaning this test can be used) for the duration of the COVID-19 declaration under Section 564(b)(1) of the Act, 21 U.S.C.section 360bbb-3(b)(1), unless the authorization is terminated  or revoked sooner.       Influenza A by PCR NEGATIVE NEGATIVE Final   Influenza B by PCR NEGATIVE NEGATIVE Final    Comment: (NOTE) The Xpert Xpress SARS-CoV-2/FLU/RSV plus assay is intended as an aid in the diagnosis of influenza from Nasopharyngeal swab specimens and should not be used as a sole basis for treatment. Nasal washings and aspirates are unacceptable for Xpert Xpress SARS-CoV-2/FLU/RSV testing.  Fact Sheet for  Patients: EntrepreneurPulse.com.au  Fact Sheet for Healthcare Providers: IncredibleEmployment.be  This test is not yet approved or cleared by the [place] FDA and has been authorized for detection and/or diagnosis of SARS-CoV-2 by FDA under an Emergency Use Authorization (EUA). This EUA will remain in effect (meaning this test can be used) for the duration of the COVID-19 declaration under Section 564(b)(1) of the Act, 21 U.S.C. section 360bbb-3(b)(1), unless the authorization is terminated or revoked.  Performed at [facility], [place] [address] [place], [place] [address]      Radiology Studies: CT Abdomen Pelvis W Contrast  Result Date: 03/22/2020 CLINICAL DATA:  Left lower quadrant pain, rectal bleeding EXAM: CT ABDOMEN AND PELVIS WITH CONTRAST TECHNIQUE: Multidetector CT imaging of the abdomen and pelvis was performed using the standard protocol following bolus administration of intravenous contrast. CONTRAST:  130mL OMNIPAQUE IOHEXOL 300 MG/ML  SOLN COMPARISON:  Renal ultrasound 12/01/2019 FINDINGS: Lower chest: Lung bases are clear. Normal heart size. No pericardial effusion. Hepatobiliary: Hypoattenuation of the liver parenchyma may be related to contrast timing with a more normal appearance on delayed phase imaging. No concerning liver lesion. Smooth liver surface contour. Largely calcified 1.6 cm gallstone noted in the neck of the gallbladder. No peri or cystic fluid or inflammation. No biliary ductal dilatation. Pancreas: No pancreatic ductal dilatation or surrounding inflammatory changes. Spleen: Normal in size. No concerning splenic lesions. Adrenals/Urinary Tract: Normal adrenal glands. Kidneys are normally located with symmetric enhancement and excretion. No suspicious renal lesion, urolithiasis or hydronephrosis. Urinary bladder largely decompressed at the time of exam. No gross bladder abnormality accounting for underdistention.  Stomach/Bowel: Distal esophagus, stomach and duodenal sweep are unremarkable. No small bowel wall thickening or dilatation. There are postsurgical changes from prior appendectomy. Segmental thickening of the colon from the splenic flexure through the proximal sigmoid with low attenuation mural thickening and pronounced mucosal hyperemia as well as some surrounding pericolonic hazy stranding. No evidence of bowel obstruction. No extraluminal gas, organized collection or abscess is seen. No pneumatosis or portal venous gas is seen at this time. Vascular/Lymphatic: Atherosclerotic calcifications within the abdominal aorta and branch vessels. No aneurysm or ectasia. No large acute central occlusion is seen within the limitations of this non  arterial phase exam. No enlarged abdominopelvic lymph nodes. Reproductive: Anteverted uterus with calcified right uterine body fibroid measuring up to 1.5 cm. No concerning adnexal lesions. Other: Pericolonic hazy stranding, as above most pronounced in the descending colon. Musculoskeletal: Multilevel degenerative changes are present in the imaged portions of the spine. Vertebral body hemangioma T9. IMPRESSION: 1. Segmental thickening of the colon from the splenic flexure through the proximal sigmoid with low attenuation mural thickening and pronounced mucosal hyperemia as well as some surrounding pericolonic hazy stranding. Findings are consistent with a nonspecific colitis, which could be infectious, inflammatory or vascular in etiology. 2. Cholelithiasis without evidence of acute cholecystitis. 3. Fibroid uterus. 4. Aortic Atherosclerosis (ICD10-I70.0). Electronically Signed   By: Lovena Le M.D.   On: 03/22/2020 01:30    Scheduled Meds: . ALPRAZolam  1 mg Oral QHS  . escitalopram  20 mg Oral Daily  . metoprolol tartrate  25 mg Oral BID  . vitamin B-12  1,000 mcg Oral Daily   Continuous Infusions: . sodium chloride 75 mL/hr at 03/22/20 2146  . cefTRIAXone (ROCEPHIN)   IV 2 g (03/23/20 0902)  . metronidazole 500 mg (03/23/20 1154)     LOS: 1 day   Marylu Lund, MD [facility] Pager On Amion  If 7PM-7AM, please contact night-coverage 03/23/2020, 4:44 PM

## 2020-03-23 NOTE — Plan of Care (Signed)
  Problem: Education: Goal: Knowledge of General Education information will improve Description: Including pain rating scale, medication(s)/side effects and non-pharmacologic comfort measures Outcome: Progressing   Problem: Clinical Measurements: Goal: Ability to maintain clinical measurements within normal limits will improve Outcome: Progressing Goal: Will remain free from infection Outcome: Progressing Goal: Respiratory complications will improve Outcome: Progressing   Problem: Pain Managment: Goal: General experience of comfort will improve Outcome: Progressing

## 2020-03-23 NOTE — Plan of Care (Signed)
  Problem: Education: Goal: Knowledge of General Education information will improve Description: Including pain rating scale, medication(s)/side effects and non-pharmacologic comfort measures Outcome: Progressing   Problem: Clinical Measurements: Goal: Ability to maintain clinical measurements within normal limits will improve Outcome: Progressing Goal: Will remain free from infection Outcome: Progressing Goal: Diagnostic test results will improve Outcome: Progressing Goal: Respiratory complications will improve Outcome: Progressing Goal: Cardiovascular complication will be avoided Outcome: Progressing   Problem: Nutrition: Goal: Adequate nutrition will be maintained Outcome: Progressing   Problem: Elimination: Goal: Will not experience complications related to bowel motility Outcome: Progressing Goal: Will not experience complications related to urinary retention Outcome: Progressing   Problem: Pain Managment: Goal: General experience of comfort will improve Outcome: Progressing   

## 2020-03-23 NOTE — Progress Notes (Signed)
Notified by RN that pt requesting a soft diet. Has tolerated full liquids today.  No procedure planned for tomorrow Soft diet ordered per pt request

## 2020-03-24 ENCOUNTER — Other Ambulatory Visit: Payer: Self-pay | Admitting: Internal Medicine

## 2020-03-24 LAB — COMPREHENSIVE METABOLIC PANEL
ALT: 16 U/L (ref 0–44)
AST: 17 U/L (ref 15–41)
Albumin: 3 g/dL — ABNORMAL LOW (ref 3.5–5.0)
Alkaline Phosphatase: 63 U/L (ref 38–126)
Anion gap: 8 (ref 5–15)
BUN: 9 mg/dL (ref 6–20)
CO2: 23 mmol/L (ref 22–32)
Calcium: 8.9 mg/dL (ref 8.9–10.3)
Chloride: 106 mmol/L (ref 98–111)
Creatinine, Ser: 0.94 mg/dL (ref 0.44–1.00)
GFR, Estimated: >60 mL/min (ref >60)
Glucose, Bld: 95 mg/dL (ref 70–99)
Potassium: 3.7 mmol/L (ref 3.5–5.1)
Sodium: 137 mmol/L (ref 135–145)
Total Bilirubin: 0.6 mg/dL (ref 0.3–1.2)
Total Protein: 6 g/dL — ABNORMAL LOW (ref 6.5–8.1)

## 2020-03-24 LAB — CBC
HCT: 34.3 % — ABNORMAL LOW (ref 36.0–46.0)
Hemoglobin: 11.9 g/dL — ABNORMAL LOW (ref 12.0–15.0)
MCH: 30.7 pg (ref 26.0–34.0)
MCHC: 34.7 g/dL (ref 30.0–36.0)
MCV: 88.6 fL (ref 80.0–100.0)
Platelets: 273 10*3/uL (ref 150–400)
RBC: 3.87 MIL/uL (ref 3.87–5.11)
RDW: 12.2 % (ref 11.5–15.5)
WBC: 7.9 10*3/uL (ref 4.0–10.5)
nRBC: 0 % (ref 0.0–0.2)

## 2020-03-24 MED ORDER — METRONIDAZOLE 250 MG PO TABS: 250.0000 mg | ORAL_TABLET | Freq: Three times a day (TID) | ORAL | 0 refills | Status: AC

## 2020-03-24 MED ORDER — METRONIDAZOLE 500 MG PO TABS
500.0000 mg | ORAL_TABLET | Freq: Three times a day (TID) | ORAL | Status: DC
  Administered 2020-03-24: 500 mg via ORAL
  Filled 2020-03-24: qty 1

## 2020-03-24 MED ORDER — CEFDINIR 300 MG PO CAPS: 300.0000 mg | ORAL_CAPSULE | Freq: Two times a day (BID) | ORAL | 0 refills | Status: AC

## 2020-03-24 MED FILL — metroNIDAZOLE 250 MG TABS: 250 | 7 days supply | Qty: 21 | Fill #0

## 2020-03-24 MED FILL — CEFDINIR 300 MG CAPSULE: 300 | 7 days supply | Qty: 14 | Fill #0

## 2020-03-24 NOTE — Discharge Summary (Signed)
Physician Discharge Summary  Amanda [name] FMB:[id number] DOB: 04/30/1960 DOA: 03/21/2020  PCP: Antony Contras, MD  Admit date: 03/21/2020 Discharge date: 03/24/2020  Admitted From: Home Disposition:  Home  Recommendations for Outpatient Follow-up:  1. Follow up with PCP in 1-2 weeks 2. Follow up with GI as outpatient as scheduled  Discharge Condition:Improved CODE STATUS:Full Diet recommendation: Regular   Brief/Interim Summary: 60 y.o.femalewith medical history significant ofCKD 3, HTN, HLD. Pt presents to the ED for c/o abd pain and BRBPR. Pt woke up from sleep about 20h PTA with diffuse lower abd pain. Initially had episode of diarrhea without any blood however subsequent episodes of diarrhea have bright red blood in them. In the ED, pt underwent CT abd with findings suspicious for segmental thickening of the colon from splenic flexure through prox sigmoid consistent with nonspecific colitis.   Discharge Diagnoses:  Principal Problem:   Colitis Active Problems:   Chronic kidney disease, stage 3 ([place])  1. Colitis - 1. Pt had been continued on empiric rocephin with flagyl 2. Successfully advanced diet with no further significant diarrhea or blood per rectum 3. Will complete course of omnicef with flagyl on d/c 4. Will refer pt to GI for outpt follow up 2. BRBPR - 1. Hemoglobin stable initially in ED 2. Suspect secondary to acute colitis per above 3. Seems resolved now 4. Hgb has remained stable this visit 5. Per above, have referred pt to GI for outpt f/u 3. CKD stage 3 1. Renal function stable with GFR>30  Discharge Instructions   Allergies as of 03/24/2020      Reactions   Ace Inhibitors    REACTION: cough   Ambien [zolpidem]    Other reaction(s): headache/neck thumping   Amoxicillin    Gi upset   Codeine Nausea And Vomiting   Can tolerate hydrocodone if has food on stomach Can tolerate hydrocodone if has food on stomach   Cortisone    Extremely high  BP   Crestor [rosuvastatin]    Other reaction(s): facial rash   Felodipine    Other reaction(s): increased flushing and made her feel bad   Hydrocodone-acetaminophen    REACTION: vomitting   Lisinopril Cough   Losartan Potassium    REACTION: cough   Statins    Face and leg rash with rosuvastatin, pravastatin, and atorvastatin   Venlafaxine    Other reaction(s): headaches/elevated blood pressure   Zetia [ezetimibe] Diarrhea      Medication List    TAKE these medications   ALPRAZolam 1 MG tablet Commonly known as: XANAX TAKE 1 TABLET AT BEDTIME AND 1/2-1 TABLET AS NEEDED FOR ANXIETY. What changed:   how much to take  how to take this  when to take this   cefdinir 300 MG capsule Commonly known as: OMNICEF Take 1 capsule (300 mg total) by mouth 2 (two) times daily for 7 days.   Deplin 15 15-90.314 MG Caps Take 15 mg by mouth daily.   diphenhydrAMINE 25 mg capsule Commonly known as: BENADRYL Take 25 mg by mouth every 6 (six) hours as needed for itching, sleep or allergies.   escitalopram 20 MG tablet Commonly known as: LEXAPRO Take 1 tablet (20 mg total) by mouth daily.   ESOMEPRAZOLE MAGNESIUM PO Take 20 mg by mouth daily as needed (heartburn).   ibuprofen 200 MG tablet Commonly known as: ADVIL Take 200 mg by mouth every 6 (six) hours as needed for moderate pain or headache.   metoprolol tartrate 25 MG tablet Commonly known  as: LOPRESSOR Take 25 mg by mouth 2 (two) times daily.   metroNIDAZOLE 250 MG tablet Commonly known as: FLAGYL Take 1 tablet (250 mg total) by mouth 3 (three) times daily for 7 days.   rizatriptan 10 MG tablet Commonly known as: MAXALT Take 10 mg by mouth as needed for migraine.   TAGAMET PO Take 1 tablet by mouth daily as needed (heartburn).   vitamin B-12 1000 MCG tablet Commonly known as: CYANOCOBALAMIN Take 1,000 mcg by mouth.       Follow-up Information    Antony Contras, MD. Schedule an appointment as soon as possible for  a visit in 2 week(s).   Specialty: Family Medicine Contact information: [address] [place] [address] [phone]        Burnell Blanks, MD .   Specialty: Cardiology Contact information: [address] [place] [address] [phone]        Carol Ada, MD Follow up.   Specialty: Gastroenterology Why: as scheduled Contact information: [place], [address] [place] [address] [phone]              Allergies  Allergen Reactions  . Ace Inhibitors     REACTION: cough  . Ambien [Zolpidem]     Other reaction(s): headache/neck thumping  . Amoxicillin     Gi upset  . Codeine Nausea And Vomiting    Can tolerate hydrocodone if has food on stomach Can tolerate hydrocodone if has food on stomach   . Cortisone     Extremely high BP  . Crestor [Rosuvastatin]     Other reaction(s): facial rash  . Felodipine     Other reaction(s): increased flushing and made her feel bad  . Hydrocodone-Acetaminophen     REACTION: vomitting  . Lisinopril Cough  . Losartan Potassium     REACTION: cough  . Statins     Face and leg rash with rosuvastatin, pravastatin, and atorvastatin  . Venlafaxine     Other reaction(s): headaches/elevated blood pressure  . Zetia [Ezetimibe] Diarrhea    Procedures/Studies: CT Abdomen Pelvis W Contrast  Result Date: 03/22/2020 CLINICAL DATA:  Left lower quadrant pain, rectal bleeding EXAM: CT ABDOMEN AND PELVIS WITH CONTRAST TECHNIQUE: Multidetector CT imaging of the abdomen and pelvis was performed using the standard protocol following bolus administration of intravenous contrast. CONTRAST:  125mL OMNIPAQUE IOHEXOL 300 MG/ML  SOLN COMPARISON:  Renal ultrasound 12/01/2019 FINDINGS: Lower chest: Lung bases are clear. Normal heart size. No pericardial effusion. Hepatobiliary: Hypoattenuation of the liver parenchyma may be related to contrast timing with a more normal appearance on delayed  phase imaging. No concerning liver lesion. Smooth liver surface contour. Largely calcified 1.6 cm gallstone noted in the neck of the gallbladder. No peri or cystic fluid or inflammation. No biliary ductal dilatation. Pancreas: No pancreatic ductal dilatation or surrounding inflammatory changes. Spleen: Normal in size. No concerning splenic lesions. Adrenals/Urinary Tract: Normal adrenal glands. Kidneys are normally located with symmetric enhancement and excretion. No suspicious renal lesion, urolithiasis or hydronephrosis. Urinary bladder largely decompressed at the time of exam. No gross bladder abnormality accounting for underdistention. Stomach/Bowel: Distal esophagus, stomach and duodenal sweep are unremarkable. No small bowel wall thickening or dilatation. There are postsurgical changes from prior appendectomy. Segmental thickening of the colon from the splenic flexure through the proximal sigmoid with low attenuation mural thickening and pronounced mucosal hyperemia as well as some surrounding pericolonic hazy stranding. No evidence of bowel obstruction. No extraluminal gas, organized  collection or abscess is seen. No pneumatosis or portal venous gas is seen at this time. Vascular/Lymphatic: Atherosclerotic calcifications within the abdominal aorta and branch vessels. No aneurysm or ectasia. No large acute central occlusion is seen within the limitations of this non arterial phase exam. No enlarged abdominopelvic lymph nodes. Reproductive: Anteverted uterus with calcified right uterine body fibroid measuring up to 1.5 cm. No concerning adnexal lesions. Other: Pericolonic hazy stranding, as above most pronounced in the descending colon. Musculoskeletal: Multilevel degenerative changes are present in the imaged portions of the spine. Vertebral body hemangioma T9. IMPRESSION: 1. Segmental thickening of the colon from the splenic flexure through the proximal sigmoid with low attenuation mural thickening and  pronounced mucosal hyperemia as well as some surrounding pericolonic hazy stranding. Findings are consistent with a nonspecific colitis, which could be infectious, inflammatory or vascular in etiology. 2. Cholelithiasis without evidence of acute cholecystitis. 3. Fibroid uterus. 4. Aortic Atherosclerosis (ICD10-I70.0). Electronically Signed   By: Lovena Le M.D.   On: 03/22/2020 01:30     Subjective: Eager to go home  Discharge Exam: Vitals:   03/23/20 2133 03/24/20 0532  BP: (!) 151/85 139/63  Pulse: 81 76  Resp:  16  Temp: 98.5 F (36.9 C) 97.9 F (36.6 C)  SpO2: 96% 95%   Vitals:   03/23/20 1201 03/23/20 2000 03/23/20 2133 03/24/20 0532  BP: (!) 164/83  (!) 151/85 139/63  Pulse: 78  81 76  Resp: 20 18  16   Temp: (!) 97.4 F (36.3 C)  98.5 F (36.9 C) 97.9 F (36.6 C)  TempSrc: Oral  Oral Oral  SpO2: 96% 94% 96% 95%  Weight:      Height:        General: Pt is alert, awake, not in acute distress Cardiovascular: RRR, S1/S2 +, no rubs, no gallops Respiratory: CTA bilaterally, no wheezing, no rhonchi Abdominal: Soft, NT, ND, bowel sounds + Extremities: no edema, no cyanosis   The results of significant diagnostics from this hospitalization (including imaging, microbiology, ancillary and laboratory) are listed below for reference.     Microbiology: Recent Results (from the past 240 hour(s))  Resp Panel by RT-PCR (Flu A&B, Covid) Nasopharyngeal Swab     Status: None   Collection Time: 03/22/20  3:16 AM   Specimen: Nasopharyngeal Swab; Nasopharyngeal(NP) swabs in vial transport medium  Result Value Ref Range Status   SARS Coronavirus 2 by RT PCR NEGATIVE NEGATIVE Final    Comment: (NOTE) SARS-CoV-2 target nucleic acids are NOT DETECTED.  The SARS-CoV-2 RNA is generally detectable in upper respiratory specimens during the acute phase of infection. The lowest concentration of SARS-CoV-2 viral copies this assay can detect is 138 copies/mL. A negative result does not  preclude SARS-Cov-2 infection and should not be used as the sole basis for treatment or other patient management decisions. A negative result may occur with  improper specimen collection/handling, submission of specimen other than nasopharyngeal swab, presence of viral mutation(s) within the areas targeted by this assay, and inadequate number of viral copies(<138 copies/mL). A negative result must be combined with clinical observations, patient history, and epidemiological information. The expected result is Negative.  Fact Sheet for Patients:  EntrepreneurPulse.com.au  Fact Sheet for Healthcare Providers:  IncredibleEmployment.be  This test is no t yet approved or cleared by the [place] FDA and  has been authorized for detection and/or diagnosis of SARS-CoV-2 by FDA under an Emergency Use Authorization (EUA). This EUA will remain  in effect (meaning this test can  be used) for the duration of the COVID-19 declaration under Section 564(b)(1) of the Act, 21 U.S.C.section 360bbb-3(b)(1), unless the authorization is terminated  or revoked sooner.       Influenza A by PCR NEGATIVE NEGATIVE Final   Influenza B by PCR NEGATIVE NEGATIVE Final    Comment: (NOTE) The Xpert Xpress SARS-CoV-2/FLU/RSV plus assay is intended as an aid in the diagnosis of influenza from Nasopharyngeal swab specimens and should not be used as a sole basis for treatment. Nasal washings and aspirates are unacceptable for Xpert Xpress SARS-CoV-2/FLU/RSV testing.  Fact Sheet for Patients: EntrepreneurPulse.com.au  Fact Sheet for Healthcare Providers: IncredibleEmployment.be  This test is not yet approved or cleared by the [place] FDA and has been authorized for detection and/or diagnosis of SARS-CoV-2 by FDA under an Emergency Use Authorization (EUA). This EUA will remain in effect (meaning this test can be used) for the  duration of the COVID-19 declaration under Section 564(b)(1) of the Act, 21 U.S.C. section 360bbb-3(b)(1), unless the authorization is terminated or revoked.  Performed at [facility], [place] [address], [place], [place] [address]      Labs: BNP (last 3 results) No results for input(s): BNP in the last 8760 hours. Basic Metabolic Panel: Recent Labs  Lab 03/21/20 1720 03/22/20 0502 03/23/20 0032 03/24/20 0214  NA 135 138 137 137  K 3.5 4.0 4.1 3.7  CL 102 105 105 106  CO2 22 24 23 23   GLUCOSE 121* 104* 113* 95  BUN 12 11 10 9   CREATININE 1.07* 1.05* 0.94 0.94  CALCIUM 9.7 8.8* 9.2 8.9   Liver Function Tests: Recent Labs  Lab 03/21/20 1720 03/23/20 0032 03/24/20 0214  AST 23 17 17   ALT 23 20 16   ALKPHOS 94 73 63  BILITOT 0.8 0.5 0.6  PROT 7.5 5.9* 6.0*  ALBUMIN 4.0 3.2* 3.0*   Recent Labs  Lab 03/21/20 1720  LIPASE 33   No results for input(s): AMMONIA in the last 168 hours. CBC: Recent Labs  Lab 03/21/20 1720 03/22/20 0042 03/22/20 0502 03/23/20 0032 03/24/20 0214  WBC 13.0*  --  9.0 12.4* 7.9  HGB 14.2 14.3 12.3 12.6 11.9*  HCT 42.6 40.8 37.8 38.1 34.3*  MCV 89.1  --  91.5 89.2 88.6  PLT 353  --  281 305 273   Cardiac Enzymes: No results for input(s): CKTOTAL, CKMB, CKMBINDEX, TROPONINI in the last 168 hours. BNP: Invalid input(s): POCBNP CBG: No results for input(s): GLUCAP in the last 168 hours. D-Dimer No results for input(s): DDIMER in the last 72 hours. Hgb A1c No results for input(s): HGBA1C in the last 72 hours. Lipid Profile No results for input(s): CHOL, HDL, LDLCALC, TRIG, CHOLHDL, LDLDIRECT in the last 72 hours. Thyroid function studies No results for input(s): TSH, T4TOTAL, T3FREE, THYROIDAB in the last 72 hours.  Invalid input(s): FREET3 Anemia work up No results for input(s): VITAMINB12, FOLATE, FERRITIN, TIBC, IRON, RETICCTPCT in the last 72 hours. Urinalysis    Component Value Date/Time   COLORURINE YELLOW  03/21/2020 1718   APPEARANCEUR TURBID (A) 03/21/2020 1718   LABSPEC 1.029 03/21/2020 1718   PHURINE 5.0 03/21/2020 1718   GLUCOSEU NEGATIVE 03/21/2020 1718   HGBUR NEGATIVE 03/21/2020 1718   HGBUR negative 07/01/2007 [place] 03/21/2020 1718   KETONESUR NEGATIVE 03/21/2020 1718   PROTEINUR NEGATIVE 03/21/2020 1718   UROBILINOGEN 0.2 03/21/2020 1539   NITRITE NEGATIVE 03/21/2020 1718   LEUKOCYTESUR NEGATIVE 03/21/2020 1718   Sepsis Labs  Invalid input(s): PROCALCITONIN,  WBC,  LACTICIDVEN Microbiology Recent Results (from the past 240 hour(s))  Resp Panel by RT-PCR (Flu A&B, Covid) Nasopharyngeal Swab     Status: None   Collection Time: 03/22/20  3:16 AM   Specimen: Nasopharyngeal Swab; Nasopharyngeal(NP) swabs in vial transport medium  Result Value Ref Range Status   SARS Coronavirus 2 by RT PCR NEGATIVE NEGATIVE Final    Comment: (NOTE) SARS-CoV-2 target nucleic acids are NOT DETECTED.  The SARS-CoV-2 RNA is generally detectable in upper respiratory specimens during the acute phase of infection. The lowest concentration of SARS-CoV-2 viral copies this assay can detect is 138 copies/mL. A negative result does not preclude SARS-Cov-2 infection and should not be used as the sole basis for treatment or other patient management decisions. A negative result may occur with  improper specimen collection/handling, submission of specimen other than nasopharyngeal swab, presence of viral mutation(s) within the areas targeted by this assay, and inadequate number of viral copies(<138 copies/mL). A negative result must be combined with clinical observations, patient history, and epidemiological information. The expected result is Negative.  Fact Sheet for Patients:  EntrepreneurPulse.com.au  Fact Sheet for Healthcare Providers:  IncredibleEmployment.be  This test is no t yet approved or cleared by the [place] FDA and  has  been authorized for detection and/or diagnosis of SARS-CoV-2 by FDA under an Emergency Use Authorization (EUA). This EUA will remain  in effect (meaning this test can be used) for the duration of the COVID-19 declaration under Section 564(b)(1) of the Act, 21 U.S.C.section 360bbb-3(b)(1), unless the authorization is terminated  or revoked sooner.       Influenza A by PCR NEGATIVE NEGATIVE Final   Influenza B by PCR NEGATIVE NEGATIVE Final    Comment: (NOTE) The Xpert Xpress SARS-CoV-2/FLU/RSV plus assay is intended as an aid in the diagnosis of influenza from Nasopharyngeal swab specimens and should not be used as a sole basis for treatment. Nasal washings and aspirates are unacceptable for Xpert Xpress SARS-CoV-2/FLU/RSV testing.  Fact Sheet for Patients: EntrepreneurPulse.com.au  Fact Sheet for Healthcare Providers: IncredibleEmployment.be  This test is not yet approved or cleared by the [place] FDA and has been authorized for detection and/or diagnosis of SARS-CoV-2 by FDA under an Emergency Use Authorization (EUA). This EUA will remain in effect (meaning this test can be used) for the duration of the COVID-19 declaration under Section 564(b)(1) of the Act, 21 U.S.C. section 360bbb-3(b)(1), unless the authorization is terminated or revoked.  Performed at [facility], [place] [address], [place], [place] [address]    Time spent: 53min  SIGNED:   Marylu Lund, MD  [facility] 03/24/2020, 12:12 PM  If 7PM-7AM, please contact night-coverage

## 2020-03-24 NOTE — Plan of Care (Signed)
  Problem: Education: Goal: Knowledge of General Education information will improve Description: Including pain rating scale, medication(s)/side effects and non-pharmacologic comfort measures Outcome: Adequate for Discharge   Problem: Clinical Measurements: Goal: Ability to maintain clinical measurements within normal limits will improve Outcome: Adequate for Discharge Goal: Will remain free from infection Outcome: Adequate for Discharge Goal: Diagnostic test results will improve Outcome: Adequate for Discharge Goal: Respiratory complications will improve Outcome: Adequate for Discharge Goal: Cardiovascular complication will be avoided Outcome: Adequate for Discharge   Problem: Nutrition: Goal: Adequate nutrition will be maintained Outcome: Adequate for Discharge   Problem: Elimination: Goal: Will not experience complications related to bowel motility Outcome: Adequate for Discharge Goal: Will not experience complications related to urinary retention Outcome: Adequate for Discharge   Problem: Pain Managment: Goal: General experience of comfort will improve Outcome: Adequate for Discharge   

## 2020-03-27 DIAGNOSIS — R1084 Generalized abdominal pain: Secondary | ICD-10-CM | POA: Diagnosis not present

## 2020-03-27 DIAGNOSIS — R197 Diarrhea, unspecified: Secondary | ICD-10-CM | POA: Diagnosis not present

## 2020-03-27 DIAGNOSIS — K559 Vascular disorder of intestine, unspecified: Secondary | ICD-10-CM | POA: Diagnosis not present

## 2020-04-03 DIAGNOSIS — I700 Atherosclerosis of aorta: Secondary | ICD-10-CM | POA: Diagnosis not present

## 2020-04-03 DIAGNOSIS — K529 Noninfective gastroenteritis and colitis, unspecified: Secondary | ICD-10-CM | POA: Diagnosis not present

## 2020-04-03 DIAGNOSIS — R5381 Other malaise: Secondary | ICD-10-CM | POA: Diagnosis not present

## 2020-04-06 DIAGNOSIS — R933 Abnormal findings on diagnostic imaging of other parts of digestive tract: Secondary | ICD-10-CM | POA: Diagnosis not present

## 2020-04-06 DIAGNOSIS — R197 Diarrhea, unspecified: Secondary | ICD-10-CM | POA: Diagnosis not present

## 2020-04-27 DIAGNOSIS — I129 Hypertensive chronic kidney disease with stage 1 through stage 4 chronic kidney disease, or unspecified chronic kidney disease: Secondary | ICD-10-CM | POA: Diagnosis not present

## 2020-04-27 DIAGNOSIS — E559 Vitamin D deficiency, unspecified: Secondary | ICD-10-CM | POA: Diagnosis not present

## 2020-04-27 DIAGNOSIS — E782 Mixed hyperlipidemia: Secondary | ICD-10-CM | POA: Diagnosis not present

## 2020-04-27 DIAGNOSIS — Z0000 Encounter for general adult medical examination without abnormal findings: Secondary | ICD-10-CM | POA: Diagnosis not present

## 2020-04-27 DIAGNOSIS — N1832 Chronic kidney disease, stage 3b: Secondary | ICD-10-CM | POA: Diagnosis not present

## 2020-04-27 DIAGNOSIS — E538 Deficiency of other specified B group vitamins: Secondary | ICD-10-CM | POA: Diagnosis not present

## 2020-04-27 DIAGNOSIS — G43909 Migraine, unspecified, not intractable, without status migrainosus: Secondary | ICD-10-CM | POA: Diagnosis not present

## 2020-05-03 DIAGNOSIS — Z23 Encounter for immunization: Secondary | ICD-10-CM | POA: Diagnosis not present

## 2020-05-04 ENCOUNTER — Telehealth: Payer: Self-pay | Admitting: Pharmacist

## 2020-05-04 DIAGNOSIS — E785 Hyperlipidemia, unspecified: Secondary | ICD-10-CM

## 2020-05-04 NOTE — Telephone Encounter (Addendum)
Lipid panel faxed over from [facility] 04/27/20: TC 234, TG 201, HDL 47, LDL 150, nonHDL 186.  Pt is supposed to be taking Nexletol, it looks like this was discontinued from her med list 03/22/20 at hospital discharge for colitis with no reason for d/c mentioned. LDL had previously improved to 102 after starting Nexletol.  Pt is previously intolerant to pravastatin 20mg  every other day, rosuvastatin, atorvastatin (facial rash with all) and ezetimibe (GI upset with 5-10mg  dose, 5mg  dose didn't lower LDL at all).  Abdominal CT 03/22/20 showed aortic atherosclerosis. Pt would benefit from LDL goal < 70.  Left message for pt to clarify why she stopped Nexletol, will plan to resume. Can also consider PCSK9i therapy - previously not pursued since PCSK9i initially were not approved for primary prevention and pt didn't have documented atherosclerosis at initial visit.

## 2020-05-05 MED ORDER — REPATHA SURECLICK 140 MG/ML ~~LOC~~ SOAJ: 1.0000 "pen " | SUBCUTANEOUS | 11 refills | Status: DC

## 2020-05-05 NOTE — Telephone Encounter (Addendum)
Called pt again. She states she was experiencing brain fog and stopped her Nexletol 1-2 months ago and felt better after. She is interested in trying Repatha. Prior authorization submitted and approved. Rx sent to pharmacy and $5 copay card activated/called in (ID [id number]). Scheduled f/u labs in 2 months to assess efficacy. Reviewed injection technique over the phone per pt preference, she will call with any concerns.

## 2020-05-08 DIAGNOSIS — R197 Diarrhea, unspecified: Secondary | ICD-10-CM | POA: Diagnosis not present

## 2020-05-22 DIAGNOSIS — E785 Hyperlipidemia, unspecified: Secondary | ICD-10-CM | POA: Diagnosis not present

## 2020-05-22 DIAGNOSIS — N183 Chronic kidney disease, stage 3 unspecified: Secondary | ICD-10-CM | POA: Diagnosis not present

## 2020-05-22 DIAGNOSIS — I129 Hypertensive chronic kidney disease with stage 1 through stage 4 chronic kidney disease, or unspecified chronic kidney disease: Secondary | ICD-10-CM | POA: Diagnosis not present

## 2020-06-13 ENCOUNTER — Telehealth (INDEPENDENT_AMBULATORY_CARE_PROVIDER_SITE_OTHER): Payer: BC Managed Care – PPO | Admitting: Psychiatry

## 2020-06-13 ENCOUNTER — Encounter: Payer: Self-pay | Admitting: Psychiatry

## 2020-06-13 DIAGNOSIS — R059 Cough, unspecified: Secondary | ICD-10-CM | POA: Diagnosis not present

## 2020-06-13 DIAGNOSIS — F5104 Psychophysiologic insomnia: Secondary | ICD-10-CM

## 2020-06-13 DIAGNOSIS — F32A Depression, unspecified: Secondary | ICD-10-CM | POA: Diagnosis not present

## 2020-06-13 DIAGNOSIS — J40 Bronchitis, not specified as acute or chronic: Secondary | ICD-10-CM | POA: Diagnosis not present

## 2020-06-13 DIAGNOSIS — F419 Anxiety disorder, unspecified: Secondary | ICD-10-CM | POA: Diagnosis not present

## 2020-06-13 MED ORDER — DEPLIN 15 15-90.314 MG PO CAPS
15.0000 mg | ORAL_CAPSULE | Freq: Every day | ORAL | 3 refills | Status: DC
Start: 2020-06-13 — End: 2020-12-14

## 2020-06-13 MED ORDER — ESCITALOPRAM OXALATE 20 MG PO TABS: 20.0000 mg | ORAL_TABLET | Freq: Every day | ORAL | 1 refills | Status: DC

## 2020-06-13 MED ORDER — ALPRAZOLAM 1 MG PO TABS
ORAL_TABLET | ORAL | 5 refills | Status: DC
Start: 2020-06-13 — End: 2020-12-14

## 2020-06-13 NOTE — Progress Notes (Signed)
Amanda [name] [id number] 1960/11/04 60 y.o.  Virtual Visit via Video Note  I connected with pt @ on 06/13/20 at 10:00 AM EDT by a video enabled telemedicine application and verified that I am speaking with the correct person using two identifiers.   I discussed the limitations of evaluation and management by telemedicine and the availability of in person appointments. The patient expressed understanding and agreed to proceed.  I discussed the assessment and treatment plan with the patient. The patient was provided an opportunity to ask questions and all were answered. The patient agreed with the plan and demonstrated an understanding of the instructions.   The patient was advised to call back or seek an in-person evaluation if the symptoms worsen or if the condition fails to improve as anticipated.  I provided 17 minutes of non-face-to-face time during this encounter.  The patient was located at home.  The provider was located at home.   Amanda [name], PMHNP   Subjective:   Patient ID:  Amanda [name] is a 60 y.o. (DOB 1961-01-07) female.  Chief Complaint:  Chief Complaint  Patient presents with  . Follow-up    H/o anxiety, depression, and insomnia.    HPI Amanda [name] presents for follow-up of anxiety and depression. She reports that she was in the hospital in February with Colitis and lost a lot of blood. She reports that last week she had the flu and her daughter also had it. "Mentally I have been doing ok."   She reports that her energy and motivation have been low and this may be due to sickness. She reports that she has been interested in things and able to enjoy things. Denies anxiety. She reports that her sleep has been ok with occasional difficulty. Had some sleep disturbance with congestion due to flu. Appetite has been decreased with flu. She reports difficulty with concentration. She reports that Nexletol was causing some decreased concentration and she has been  switched to Repatha. Denies SI.   She went to [place] in between illnesses. They went to [employer].   Taking Xanax at bedtime and uses it prn.   Review of Systems:  Review of Systems  HENT: Positive for congestion.   Musculoskeletal: Negative for gait problem.  Psychiatric/Behavioral:       Please refer to HPI  She reports that nephrologist discontinued HCTZ and her kidney function improved She reports that heartburn resolved after hospital.    Medications: I have reviewed the patient's current medications.  Current Outpatient Medications  Medication Sig Dispense Refill  . Cimetidine (TAGAMET PO) Take 1 tablet by mouth daily as needed (heartburn).    . diphenhydrAMINE (BENADRYL) 25 mg capsule Take 25 mg by mouth every 6 (six) hours as needed for itching, sleep or allergies.    . Evolocumab (REPATHA SURECLICK) 606 MG/ML SOAJ Inject 1 pen into the skin every 14 (fourteen) days. 2 mL 11  . metoprolol tartrate (LOPRESSOR) 25 MG tablet Take 25 mg by mouth 2 (two) times daily.    . rizatriptan (MAXALT) 10 MG tablet Take 10 mg by mouth as needed for migraine.    . vitamin B-12 (CYANOCOBALAMIN) 1000 MCG tablet Take 1,000 mcg by mouth.    . ALPRAZolam (XANAX) 1 MG tablet TAKE 1 TABLET AT BEDTIME AND 1/2-1 TABLET AS NEEDED FOR ANXIETY. 45 tablet 5  . cefdinir (OMNICEF) 300 MG capsule TAKE 1 CAPSULE (300 MG TOTAL) BY MOUTH TWO TIMES DAILY FOR 7 DAYS. (Patient not taking:  Reported on 06/13/2020) 14 capsule 0  . escitalopram (LEXAPRO) 20 MG tablet Take 1 tablet (20 mg total) by mouth daily. 90 tablet 1  . ESOMEPRAZOLE MAGNESIUM PO Take 20 mg by mouth daily as needed (heartburn). (Patient not taking: Reported on 06/13/2020)    . ibuprofen (ADVIL) 200 MG tablet Take 200 mg by mouth every 6 (six) hours as needed for moderate pain or headache.    Marland Kitchen L-Methylfolate-Algae (DEPLIN 15) 15-90.314 MG CAPS Take 15 mg by mouth daily. 90 capsule 3  . metroNIDAZOLE (FLAGYL) 250 MG tablet TAKE 1  TABLET (250 MG TOTAL) BY MOUTH THREE TIMES DAILY FOR 7 DAYS. (Patient not taking: Reported on 06/13/2020) 21 tablet 0   No current facility-administered medications for this visit.    Medication Side Effects: None  Allergies:  Allergies  Allergen Reactions  . Ace Inhibitors     REACTION: cough  . Ambien [Zolpidem]     Other reaction(s): headache/neck thumping  . Amoxicillin     Gi upset  . Codeine Nausea And Vomiting    Can tolerate hydrocodone if has food on stomach Can tolerate hydrocodone if has food on stomach   . Cortisone     Extremely high BP  . Crestor [Rosuvastatin]     Other reaction(s): facial rash  . Felodipine     Other reaction(s): increased flushing and made her feel bad  . Hydrocodone-Acetaminophen     REACTION: vomitting  . Lisinopril Cough  . Losartan Potassium     REACTION: cough  . Nexletol [Bempedoic Acid]     Brain fog  . Statins     Face and leg rash with rosuvastatin, pravastatin, and atorvastatin  . Venlafaxine     Other reaction(s): headaches/elevated blood pressure  . Zetia [Ezetimibe] Diarrhea    Past Medical History:  Diagnosis Date  . Anxiety   . Asthma   . Chronic kidney disease   . Chronic kidney disease, stage 3 ([place]) 08/10/2019  . Depression   . Hyperlipidemia   . Hypertension   . Migraine     Family History  Problem Relation Age of Onset  . Asthma Father   . Heart disease Father        CAD s/p CABG  . Elevated Lipids Father   . Depression Father   . Asthma Sister   . Elevated Lipids Sister   . Hypertension Sister   . Depression Sister   . Asthma Paternal Aunt   . Elevated Lipids Mother   . Elevated Lipids Sister   . Hypertension Sister   . Bipolar disorder Sister   . Elevated Lipids Sister   . Hypertension Sister   . Depression Sister     Social History   Socioeconomic History  . Marital status: Married    Spouse name: Not on file  . Number of children: 1  . Years of education: Not on file  . Highest  education level: Not on file  Occupational History  . Occupation: Aeronautical engineer: [employer]  Tobacco Use  . Smoking status: Former Smoker    Packs/day: 0.50    Years: 5.00    Pack years: 2.50    Types: Cigarettes    Quit date: 01/29/1987    Years since quitting: 33.3  . Smokeless tobacco: Never Used  Substance and Sexual Activity  . Alcohol use: Yes    Comment: social ETOH only  . Drug use: No  . Sexual activity: Not on file  Other Topics  Concern  . Not on file  Social History Narrative  . Not on file   Social Determinants of Health   Financial Resource Strain: Not on file  Food Insecurity: Not on file  Transportation Needs: Not on file  Physical Activity: Not on file  Stress: Not on file  Social Connections: Not on file  Intimate Partner Violence: Not on file    Past Medical History, Surgical history, Social history, and Family history were reviewed and updated as appropriate.   Please see review of systems for further details on the patient's review from today.   Objective:   Physical Exam:  BP (!) 146/90   Pulse 75   LMP 03/23/2011   Physical Exam Neurological:     Mental Status: She is alert and oriented to person, place, and time.     Cranial Nerves: No dysarthria.  Psychiatric:        Attention and Perception: Attention and perception normal.        Mood and Affect: Mood normal.        Speech: Speech normal.        Behavior: Behavior is cooperative.        Thought Content: Thought content normal. Thought content is not paranoid or delusional. Thought content does not include homicidal or suicidal ideation. Thought content does not include homicidal or suicidal plan.        Cognition and Memory: Cognition and memory normal.        Judgment: Judgment normal.     Comments: Insight intact     Lab Review:     Component Value Date/Time   NA 137 03/24/2020 0214   NA 138 11/03/2019 1107   K 3.7 03/24/2020 0214   CL 106 03/24/2020 0214    CO2 23 03/24/2020 0214   GLUCOSE 95 03/24/2020 0214   BUN 9 03/24/2020 0214   BUN 17 11/03/2019 1107   CREATININE 0.94 03/24/2020 0214   CALCIUM 8.9 03/24/2020 0214   PROT 6.0 (L) 03/24/2020 0214   PROT 7.4 08/10/2019 1300   ALBUMIN 3.0 (L) 03/24/2020 0214   ALBUMIN 4.6 08/10/2019 1300   AST 17 03/24/2020 0214   ALT 16 03/24/2020 0214   ALKPHOS 63 03/24/2020 0214   BILITOT 0.6 03/24/2020 0214   BILITOT 0.5 08/10/2019 1300   GFRNONAA >60 03/24/2020 0214   GFRAA 38 (L) 11/03/2019 1107       Component Value Date/Time   WBC 7.9 03/24/2020 0214   RBC 3.87 03/24/2020 0214   HGB 11.9 (L) 03/24/2020 0214   HCT 34.3 (L) 03/24/2020 0214   PLT 273 03/24/2020 0214   MCV 88.6 03/24/2020 0214   MCH 30.7 03/24/2020 0214   MCHC 34.7 03/24/2020 0214   RDW 12.2 03/24/2020 0214   LYMPHSABS 2.4 04/04/2011 1622   MONOABS 0.7 04/04/2011 1622   EOSABS 0.1 04/04/2011 1622   BASOSABS 0.1 04/04/2011 1622    No results found for: POCLITH, LITHIUM   No results found for: PHENYTOIN, PHENOBARB, VALPROATE, CBMZ   .res Assessment: Plan:   Will continue current plan of care since target signs and symptoms are well controlled without any tolerability issues. Continue Lexapro 20 mg po qd for depression and anxiety.  Continue Deplin 15 mg po qd for augmentation of depression.  Continue Alprazolam 1 mg at bedtime for insomnia and 1/2-1 tablet as needed for anxiety.  Pt to follow-up in 6 months or sooner if clinically indicated.  Patient advised to contact office with any questions, adverse  effects, or acute worsening in signs and symptoms.  Liselle was seen today for follow-up.  Diagnoses and all orders for this visit:  Anxiety -     ALPRAZolam (XANAX) 1 MG tablet; TAKE 1 TABLET AT BEDTIME AND 1/2-1 TABLET AS NEEDED FOR ANXIETY. -     escitalopram (LEXAPRO) 20 MG tablet; Take 1 tablet (20 mg total) by mouth daily.  Chronic insomnia -     ALPRAZolam (XANAX) 1 MG tablet; TAKE 1 TABLET AT BEDTIME AND  1/2-1 TABLET AS NEEDED FOR ANXIETY.  Depression, unspecified depression type -     L-Methylfolate-Algae (DEPLIN 15) 15-90.314 MG CAPS; Take 15 mg by mouth daily. -     escitalopram (LEXAPRO) 20 MG tablet; Take 1 tablet (20 mg total) by mouth daily.     Please see After Visit Summary for patient specific instructions.  Future Appointments  Date Time Provider [place]  07/03/2020 11:30 AM [facility]    No orders of the defined types were placed in this encounter.     -------------------------------

## 2020-06-25 DIAGNOSIS — J209 Acute bronchitis, unspecified: Secondary | ICD-10-CM | POA: Diagnosis not present

## 2020-06-25 DIAGNOSIS — R059 Cough, unspecified: Secondary | ICD-10-CM | POA: Diagnosis not present

## 2020-07-03 ENCOUNTER — Other Ambulatory Visit: Payer: BC Managed Care – PPO | Admitting: *Deleted

## 2020-07-03 ENCOUNTER — Other Ambulatory Visit: Payer: Self-pay

## 2020-07-03 DIAGNOSIS — E785 Hyperlipidemia, unspecified: Secondary | ICD-10-CM | POA: Diagnosis not present

## 2020-07-03 LAB — LIPID PANEL
Chol/HDL Ratio: 2.2 ratio (ref 0.0–4.4)
Cholesterol, Total: 130 mg/dL (ref 100–199)
HDL: 60 mg/dL (ref >39)
LDL Chol Calc (NIH): 36 mg/dL (ref 0–99)
Triglycerides: 221 mg/dL — ABNORMAL HIGH (ref 0–149)
VLDL Cholesterol Cal: 34 mg/dL (ref 5–40)

## 2020-07-03 LAB — HEPATIC FUNCTION PANEL
ALT: 13 IU/L (ref 0–32)
AST: 9 IU/L (ref 0–40)
Albumin: 3.9 g/dL (ref 3.8–4.9)
Alkaline Phosphatase: 106 IU/L (ref 44–121)
Bilirubin Total: 0.5 mg/dL (ref 0.0–1.2)
Bilirubin, Direct: 0.16 mg/dL (ref 0.00–0.40)
Total Protein: 6.6 g/dL (ref 6.0–8.5)

## 2020-08-02 DIAGNOSIS — Z23 Encounter for immunization: Secondary | ICD-10-CM | POA: Diagnosis not present

## 2020-10-24 DIAGNOSIS — E559 Vitamin D deficiency, unspecified: Secondary | ICD-10-CM | POA: Diagnosis not present

## 2020-10-24 DIAGNOSIS — G43909 Migraine, unspecified, not intractable, without status migrainosus: Secondary | ICD-10-CM | POA: Diagnosis not present

## 2020-10-24 DIAGNOSIS — E538 Deficiency of other specified B group vitamins: Secondary | ICD-10-CM | POA: Diagnosis not present

## 2020-10-24 DIAGNOSIS — I129 Hypertensive chronic kidney disease with stage 1 through stage 4 chronic kidney disease, or unspecified chronic kidney disease: Secondary | ICD-10-CM | POA: Diagnosis not present

## 2020-10-24 DIAGNOSIS — N1832 Chronic kidney disease, stage 3b: Secondary | ICD-10-CM | POA: Diagnosis not present

## 2020-10-24 DIAGNOSIS — E782 Mixed hyperlipidemia: Secondary | ICD-10-CM | POA: Diagnosis not present

## 2020-12-14 ENCOUNTER — Encounter: Payer: Self-pay | Admitting: Psychiatry

## 2020-12-14 ENCOUNTER — Ambulatory Visit: Payer: BC Managed Care – PPO | Admitting: Psychiatry

## 2020-12-14 ENCOUNTER — Other Ambulatory Visit: Payer: Self-pay

## 2020-12-14 DIAGNOSIS — F5104 Psychophysiologic insomnia: Secondary | ICD-10-CM | POA: Diagnosis not present

## 2020-12-14 DIAGNOSIS — F419 Anxiety disorder, unspecified: Secondary | ICD-10-CM | POA: Diagnosis not present

## 2020-12-14 DIAGNOSIS — F32A Depression, unspecified: Secondary | ICD-10-CM | POA: Diagnosis not present

## 2020-12-14 MED ORDER — ALPRAZOLAM 1 MG PO TABS: ORAL_TABLET | ORAL | 5 refills | Status: DC

## 2020-12-14 MED ORDER — ESCITALOPRAM OXALATE 20 MG PO TABS: 20.0000 mg | ORAL_TABLET | Freq: Every day | ORAL | 1 refills | Status: DC

## 2020-12-14 MED ORDER — TRAZODONE HCL 100 MG PO TABS: ORAL_TABLET | ORAL | 2 refills | Status: DC

## 2020-12-14 NOTE — Progress Notes (Signed)
Amanda [name] [id number] September 17, 1960 60 y.o.  Subjective:   Patient ID:  Amanda [name] is a 60 y.o. (DOB 01-08-1961) female.  Chief Complaint:  Chief Complaint  Patient presents with   Follow-up    Anxiety and depression   Insomnia    HPI Amanda [name] presents to the office today for follow-up of depression and anxiety. Reports, "haven't had a great time lately." Mood was worse in June and July and mood improved some.   Having difficulty falling asleep for 1.5 months. Reports that Xanax has not been helpful- "my brain won't shut off." She reports that she has had some sleepless nights. Denies anxious thoughts.   She reports that she stopped Vit D at the recommendation of her kidney specialist. Her PCP re-checked her level and level was significantly low and recommended she re-start Vitamin D. She re-started Vit D in late September and noticed some improvement in mood after about a week.  Mood remains low. She reports that her motivation is low and does not feel like doing things in the morning. She reports that her concentration and focus is poor. Looking forward to trip over Christmas. Appetite has been good. Denies SI.   Daughter is 4.5 yo.   Has almost eliminated caffeine. Tried Hemp gummies and Tylenol PM without improvement.   Has been trying to reduce Xanax and noticed some withdrawal s/s.   Past Psychiatric Medication Trials: Wellbutrin XL- No improvement at 300 mg dose. Trintellix Lexapro- "felt the best on." Reports that she had some hair loss Zoloft Celexa Viibryd Prozac Effexor Remeron Ambien- Ineffective and caused HA Belsomra Doxepin Klonopin xanax Gabapentin Abilify Deplin   Flowsheet Row ED to Hosp-Admission (Discharged) from 03/21/2020 in [facility] Most recent reading at 03/22/2020 10:02 PM ED from 03/21/2020 in [facility] Most recent reading at 03/21/2020  3:29 PM  C-SSRS RISK CATEGORY No Risk  No Risk        Review of Systems:  Review of Systems  Musculoskeletal:  Negative for gait problem.  Neurological:  Negative for tremors and headaches.  Psychiatric/Behavioral:         Please refer to HPI   Medications: I have reviewed the patient's current medications.  Current Outpatient Medications  Medication Sig Dispense Refill   Cimetidine (TAGAMET PO) Take 1 tablet by mouth daily as needed (heartburn).     diphenhydrAMINE (BENADRYL) 25 mg capsule Take 25 mg by mouth every 6 (six) hours as needed for itching, sleep or allergies.     Evolocumab (REPATHA SURECLICK) 595 MG/ML SOAJ Inject 1 pen into the skin every 14 (fourteen) days. 2 mL 11   hydrochlorothiazide (MICROZIDE) 12.5 MG capsule Take 12.5 mg by mouth daily. Takes 1/2 daily     metoprolol tartrate (LOPRESSOR) 25 MG tablet Take 25 mg by mouth 2 (two) times daily.     rizatriptan (MAXALT) 10 MG tablet Take 10 mg by mouth as needed for migraine.     traZODone (DESYREL) 100 MG tablet Take 1/2-1 tablet po QHS prn insomnia 30 tablet 2   vitamin B-12 (CYANOCOBALAMIN) 1000 MCG tablet Take 1,000 mcg by mouth.     [START ON 12/15/2020] ALPRAZolam (XANAX) 1 MG tablet TAKE 1 TABLET AT BEDTIME AND 1/2-1 TABLET AS NEEDED FOR ANXIETY. 45 tablet 5   cefdinir (OMNICEF) 300 MG capsule TAKE 1 CAPSULE (300 MG TOTAL) BY MOUTH TWO TIMES DAILY FOR 7 DAYS. (Patient not taking: Reported on 06/13/2020) 14 capsule  0   escitalopram (LEXAPRO) 20 MG tablet Take 1 tablet (20 mg total) by mouth daily. 90 tablet 1   ESOMEPRAZOLE MAGNESIUM PO Take 20 mg by mouth daily as needed (heartburn). (Patient not taking: Reported on 06/13/2020)     metroNIDAZOLE (FLAGYL) 250 MG tablet TAKE 1 TABLET (250 MG TOTAL) BY MOUTH THREE TIMES DAILY FOR 7 DAYS. (Patient not taking: Reported on 06/13/2020) 21 tablet 0   No current facility-administered medications for this visit.    Medication Side Effects: None  Allergies:  Allergies  Allergen Reactions   Ace Inhibitors      REACTION: cough   Ambien [Zolpidem]     Other reaction(s): headache/neck thumping   Amoxicillin     Gi upset   Codeine Nausea And Vomiting    Can tolerate hydrocodone if has food on stomach Can tolerate hydrocodone if has food on stomach    Cortisone     Extremely high BP   Crestor [Rosuvastatin]     Other reaction(s): facial rash   Felodipine     Other reaction(s): increased flushing and made her feel bad   Hydrocodone-Acetaminophen     REACTION: vomitting   Lisinopril Cough   Losartan Potassium     REACTION: cough   Nexletol [Bempedoic Acid]     Brain fog   Statins     Face and leg rash with rosuvastatin, pravastatin, and atorvastatin   Venlafaxine     Other reaction(s): headaches/elevated blood pressure   Zetia [Ezetimibe] Diarrhea    Past Medical History:  Diagnosis Date   Anxiety    Asthma    Chronic kidney disease    Chronic kidney disease, stage 3 ([place]) 08/10/2019   Depression    Hyperlipidemia    Hypertension    Migraine     Past Medical History, Surgical history, Social history, and Family history were reviewed and updated as appropriate.   Please see review of systems for further details on the patient's review from today.   Objective:   Physical Exam:  LMP 03/23/2011   Physical Exam Constitutional:      General: She is not in acute distress. Musculoskeletal:        General: No deformity.  Neurological:     Mental Status: She is alert and oriented to person, place, and time.     Coordination: Coordination normal.  Psychiatric:        Attention and Perception: Attention and perception normal. She does not perceive auditory or visual hallucinations.        Mood and Affect: Mood is depressed. Mood is not anxious. Affect is not labile, blunt, angry or inappropriate.        Speech: Speech normal.        Behavior: Behavior normal.        Thought Content: Thought content normal. Thought content is not paranoid or delusional. Thought content does  not include homicidal or suicidal ideation. Thought content does not include homicidal or suicidal plan.        Cognition and Memory: Cognition and memory normal.        Judgment: Judgment normal.     Comments: Insight intact    Lab Review:     Component Value Date/Time   NA 137 03/24/2020 0214   NA 138 11/03/2019 1107   K 3.7 03/24/2020 0214   CL 106 03/24/2020 0214   CO2 23 03/24/2020 0214   GLUCOSE 95 03/24/2020 0214   BUN 9 03/24/2020 0214   BUN 17  11/03/2019 1107   CREATININE 0.94 03/24/2020 0214   CALCIUM 8.9 03/24/2020 0214   PROT 6.6 07/03/2020 1128   ALBUMIN 3.9 07/03/2020 1128   AST 9 07/03/2020 1128   ALT 13 07/03/2020 1128   ALKPHOS 106 07/03/2020 1128   BILITOT 0.5 07/03/2020 1128   GFRNONAA >60 03/24/2020 0214   GFRAA 38 (L) 11/03/2019 1107       Component Value Date/Time   WBC 7.9 03/24/2020 0214   RBC 3.87 03/24/2020 0214   HGB 11.9 (L) 03/24/2020 0214   HCT 34.3 (L) 03/24/2020 0214   PLT 273 03/24/2020 0214   MCV 88.6 03/24/2020 0214   MCH 30.7 03/24/2020 0214   MCHC 34.7 03/24/2020 0214   RDW 12.2 03/24/2020 0214   LYMPHSABS 2.4 04/04/2011 1622   MONOABS 0.7 04/04/2011 1622   EOSABS 0.1 04/04/2011 1622   BASOSABS 0.1 04/04/2011 1622    No results found for: POCLITH, LITHIUM   No results found for: PHENYTOIN, PHENOBARB, VALPROATE, CBMZ   .res Assessment: Plan:    Pt seen for 30 minutes and time spent discussing possible treatment options for insomnia. Discussed potential benefits, risks, and side effects of Trazodone. Pt agrees to trial of Trazodone. Will start Trazodone 100 mg 1/2-1 tablet at bedtime as needed for insomnia.  She reports that she would like to start treatment for insomnia and not start additional treatment for depression since improved sleep may positively affect depressive s/s. Advised to contact office if depression does not improve or worsens.  She reports that she has been gradually reducing Xanax. Discussed that Xanax could  be changed to 0.5 mg tabs to help with more gradual dose reductions to minimize withdrawal s/s. Pt reports that she will continue to use 1 mg tabs and taking 1/2 tabs.  Continue Lexapro 20 mg po qd for anxiety and depression.  Pt to follow-up in 2 months or sooner if clinically indicated.  Patient advised to contact office with any questions, adverse effects, or acute worsening in signs and symptoms.   Malaina was seen today for follow-up and insomnia.  Diagnoses and all orders for this visit:  Depression, unspecified depression type -     escitalopram (LEXAPRO) 20 MG tablet; Take 1 tablet (20 mg total) by mouth daily.  Chronic insomnia -     traZODone (DESYREL) 100 MG tablet; Take 1/2-1 tablet po QHS prn insomnia -     ALPRAZolam (XANAX) 1 MG tablet; TAKE 1 TABLET AT BEDTIME AND 1/2-1 TABLET AS NEEDED FOR ANXIETY.  Anxiety -     escitalopram (LEXAPRO) 20 MG tablet; Take 1 tablet (20 mg total) by mouth daily. -     ALPRAZolam (XANAX) 1 MG tablet; TAKE 1 TABLET AT BEDTIME AND 1/2-1 TABLET AS NEEDED FOR ANXIETY.    Please see After Visit Summary for patient specific instructions.  Future Appointments  Date Time Provider [place]  02/13/2021  1:30 PM Thayer Headings, PMHNP [facility] None    No orders of the defined types were placed in this encounter.   -------------------------------

## 2020-12-28 ENCOUNTER — Other Ambulatory Visit: Payer: Self-pay | Admitting: Obstetrics and Gynecology

## 2020-12-28 DIAGNOSIS — Z1231 Encounter for screening mammogram for malignant neoplasm of breast: Secondary | ICD-10-CM

## 2021-02-01 ENCOUNTER — Ambulatory Visit
Admission: RE | Admit: 2021-02-01 | Discharge: 2021-02-01 | Disposition: A | Discharge status: Home / Self Care | Payer: BC Managed Care – PPO | Source: Ambulatory Visit | Attending: Obstetrics and Gynecology | Admitting: Obstetrics and Gynecology

## 2021-02-01 DIAGNOSIS — Z1231 Encounter for screening mammogram for malignant neoplasm of breast: Secondary | ICD-10-CM

## 2021-02-06 NOTE — Progress Notes (Signed)
Chief Complaint  Patient presents with   Follow-up    palpitations    History of Present Illness: 61 yo female with history of HLD, HTN and palpitations (PACs/PVCs) here today for follow up. I saw her as a new patient for the evaluation of her hyperlipidemia and palpitations in June 2019. Marland Kitchen She has been tried on Lipitor and Crestor and developed a rash on her face and legs. Palpitations at night. She feels her heart racing when in bed. No chest pain or dyspnea, LE edema or syncope. Also reported fatigue. I ordered an echo but she did not come in for the study. 48 hour cardiac monitor with rare PACs and PVCs. She was referred to the lipid clinic and started on Zetia and continued on Lovaza. She did not tolerate Zetia due to diarrhea. She was tried on Pravastatin but did not tolerate. She is now on Repatha and tolerating. Mild sleep apnea but she says she has not needed a device.   She is here today for follow up. The patient denies any chest pain, dyspnea, palpitations, lower extremity edema, orthopnea, PND, dizziness, near syncope or syncope.   Primary Care Physician: Antony Contras, MD  Past Medical History:  Diagnosis Date   Anxiety    Asthma    Chronic kidney disease    Chronic kidney disease, stage 3 ([place]) 08/10/2019   Depression    Hyperlipidemia    Hypertension    Migraine     Past Surgical History:  Procedure Laterality Date   APPENDECTOMY     CARPAL TUNNEL RELEASE     rt   FOOT SURGERY Bilateral    Plantar fasciitis   OOPHORECTOMY      Current Outpatient Medications  Medication Sig Dispense Refill   ALPRAZolam (XANAX) 1 MG tablet TAKE 1 TABLET AT BEDTIME AND 1/2-1 TABLET AS NEEDED FOR ANXIETY. 45 tablet 5   Cholecalciferol (VITAMIN D3 PO) Take 100 mcg by mouth daily.     Cimetidine (TAGAMET PO) Take 1 tablet by mouth daily as needed (heartburn).     escitalopram (LEXAPRO) 20 MG tablet Take 1 tablet (20 mg total) by mouth daily. 90 tablet 1   ESOMEPRAZOLE MAGNESIUM PO  Take 20 mg by mouth daily as needed (heartburn).     Evolocumab (REPATHA SURECLICK) 740 MG/ML SOAJ Inject 1 pen into the skin every 14 (fourteen) days. 2 mL 11   hydrochlorothiazide (MICROZIDE) 12.5 MG capsule Take 6.25 mg by mouth daily. \     metoprolol tartrate (LOPRESSOR) 25 MG tablet Take 25 mg by mouth 2 (two) times daily.     rizatriptan (MAXALT) 10 MG tablet Take 10 mg by mouth as needed for migraine.     vitamin B-12 (CYANOCOBALAMIN) 1000 MCG tablet Take 1,000 mcg by mouth.     No current facility-administered medications for this visit.    Allergies  Allergen Reactions   Ace Inhibitors     REACTION: cough   Ambien [Zolpidem]     Other reaction(s): headache/neck thumping   Amoxicillin     Gi upset   Codeine Nausea And Vomiting    Can tolerate hydrocodone if has food on stomach Can tolerate hydrocodone if has food on stomach    Cortisone     Extremely high BP   Crestor [Rosuvastatin]     Other reaction(s): facial rash   Felodipine     Other reaction(s): increased flushing and made her feel bad   Hydrocodone-Acetaminophen     REACTION: vomitting  Lisinopril Cough   Losartan Potassium     REACTION: cough   Nexletol [Bempedoic Acid]     Brain fog   Statins     Face and leg rash with rosuvastatin, pravastatin, and atorvastatin   Venlafaxine     Other reaction(s): headaches/elevated blood pressure   Zetia [Ezetimibe] Diarrhea    Social History   Socioeconomic History   Marital status: Married    Spouse name: Not on file   Number of children: 1   Years of education: Not on file   Highest education level: Not on file  Occupational History   Occupation: Aeronautical engineer: [employer]  Tobacco Use   Smoking status: Former    Packs/day: 0.50    Years: 5.00    Pack years: 2.50    Types: Cigarettes    Quit date: 01/29/1987    Years since quitting: 34.0   Smokeless tobacco: Never  Substance and Sexual Activity   Alcohol use: Yes    Comment: social  ETOH only   Drug use: No   Sexual activity: Not on file  Other Topics Concern   Not on file  Social History Narrative   Not on file   Social Determinants of Health   Financial Resource Strain: Not on file  Food Insecurity: Not on file  Transportation Needs: Not on file  Physical Activity: Not on file  Stress: Not on file  Social Connections: Not on file  Intimate Partner Violence: Not on file    Family History  Problem Relation Age of Onset   Elevated Lipids Mother    Asthma Father    Heart disease Father        CAD s/p CABG   Elevated Lipids Father    Depression Father    Asthma Sister    Elevated Lipids Sister    Hypertension Sister    Depression Sister    Elevated Lipids Sister    Hypertension Sister    Bipolar disorder Sister    Elevated Lipids Sister    Hypertension Sister    Depression Sister    Asthma Paternal Aunt    Breast cancer Neg Hx     Review of Systems:  As stated in the HPI and otherwise negative.   BP 120/82    Pulse 63    Ht 5\' 2"  (1.575 m)    Wt 150 lb 12.8 oz (68.4 kg)    LMP 03/23/2011    SpO2 99%    BMI 27.58 kg/m   Physical Examination:  General: Well developed, well nourished, NAD  HEENT: OP clear, mucus membranes moist  SKIN: warm, dry. No rashes. Neuro: No focal deficits  Musculoskeletal: Muscle strength 5/5 all ext  Psychiatric: Mood and affect normal  Neck: No JVD, no carotid bruits, no thyromegaly, no lymphadenopathy.  Lungs:Clear bilaterally, no wheezes, rhonci, crackles Cardiovascular: Regular rate and rhythm. No murmurs, gallops or rubs. Abdomen:Soft. Bowel sounds present. Non-tender.  Extremities: No lower extremity edema. Pulses are 2 + in the bilateral DP/PT.  EKG:  EKG is ordered today. The ekg ordered today demonstrates Sinus  Recent Labs: 03/24/2020: BUN 9; Creatinine, Ser 0.94; Hemoglobin 11.9; Platelets 273; Potassium 3.7; Sodium 137 07/03/2020: ALT 13   Lipid Panel    Component Value Date/Time   CHOL 130  07/03/2020 1128   TRIG 221 (H) 07/03/2020 1128   HDL 60 07/03/2020 1128   CHOLHDL 2.2 07/03/2020 1128   CHOLHDL 4 05/25/2008 1036   VLDL 13.8 05/25/2008 1036  LDLCALC 36 07/03/2020 1128   LDLDIRECT 121.7 07/02/2007 0906     Wt Readings from Last 3 Encounters:  02/07/21 150 lb 12.8 oz (68.4 kg)  03/22/20 150 lb (68 kg)  03/21/20 150 lb (68 kg)     Other studies Reviewed: Additional studies/ records that were reviewed today include: . Review of the above records demonstrates:    Assessment and Plan:   1. Palpitations: Likely due to PACs/PVCs. No palpitations. Continue beta blocker.   2. Hyperlipidemia:  She has not tolerated statins or Zetia. She is being followed in the lipid clinic. Now on Repatha with LDL of 36 in June 2022.    Current medicines are reviewed at length with the patient today.  The patient does not have concerns regarding medicines.  The following changes have been made:  no change  Labs/ tests ordered today include:   Orders Placed This Encounter  Procedures   EKG 12-Lead     Disposition:   F/U with me in 12 months   Signed, Lauree Chandler, MD 02/07/2021 11:30 AM    [facility] [address], [place], [place]  [address] Phone: [phone]; Fax: [phone]

## 2021-02-07 ENCOUNTER — Encounter: Payer: Self-pay | Admitting: Cardiovascular Disease

## 2021-02-07 ENCOUNTER — Other Ambulatory Visit: Payer: Self-pay

## 2021-02-07 ENCOUNTER — Ambulatory Visit: Payer: BC Managed Care – PPO | Admitting: Cardiovascular Disease

## 2021-02-07 VITALS — BP 120/82 | HR 63 | Ht 62.0 in | Wt 150.8 lb

## 2021-02-07 DIAGNOSIS — E785 Hyperlipidemia, unspecified: Secondary | ICD-10-CM

## 2021-02-07 DIAGNOSIS — R002 Palpitations: Secondary | ICD-10-CM

## 2021-02-07 NOTE — Patient Instructions (Signed)
Medication Instructions:  Your physician recommends that you continue on your current medications as directed. Please refer to the Current Medication list given to you today.  *If you need a refill on your cardiac medications before your next appointment, please call your pharmacy*   Lab Work: None today If you have labs (blood work) drawn today and your tests are completely normal, you will receive your results only by: [place] (if you have MyChart) OR A paper copy in the mail If you have any lab test that is abnormal or we need to change your treatment, we will call you to review the results.   Testing/Procedures: None today   Follow-Up: At [facility], you and your health needs are our priority.  As part of our continuing mission to provide you with exceptional heart care, we have created designated Provider Care Teams.  These Care Teams include your primary Cardiologist (physician) and Advanced Practice Providers (APPs -  Physician Assistants and Nurse Practitioners) who all work together to provide you with the care you need, when you need it.  We recommend signing up for the patient portal called "MyChart".  Sign up information is provided on this After Visit Summary.  MyChart is used to connect with patients for Virtual Visits (Telemedicine).  Patients are able to view lab/test results, encounter notes, upcoming appointments, etc.  Non-urgent messages can be sent to your provider as well.   To learn more about what you can do with MyChart, go to [email or web address].    Your next appointment:   1 year(s)  The format for your next appointment:   In Person  Provider:   Lauree Chandler, MD {

## 2021-02-13 ENCOUNTER — Ambulatory Visit: Payer: BC Managed Care – PPO | Admitting: Psychiatry

## 2021-02-13 ENCOUNTER — Other Ambulatory Visit: Payer: Self-pay

## 2021-02-13 ENCOUNTER — Encounter: Payer: Self-pay | Admitting: Psychiatry

## 2021-02-13 DIAGNOSIS — F419 Anxiety disorder, unspecified: Secondary | ICD-10-CM

## 2021-02-13 DIAGNOSIS — F5104 Psychophysiologic insomnia: Secondary | ICD-10-CM

## 2021-02-13 DIAGNOSIS — F32A Depression, unspecified: Secondary | ICD-10-CM | POA: Diagnosis not present

## 2021-02-13 MED ORDER — LAMOTRIGINE 100 MG PO TABS: ORAL_TABLET | ORAL | 1 refills | Status: DC

## 2021-02-13 MED ORDER — QUVIVIQ 50 MG PO TABS: 50.0000 mg | ORAL_TABLET | Freq: Every day | ORAL | 2 refills | Status: DC

## 2021-02-13 MED ORDER — LAMOTRIGINE 25 MG PO TABS: ORAL_TABLET | ORAL | 0 refills | Status: DC

## 2021-02-13 MED ORDER — ESCITALOPRAM OXALATE 20 MG PO TABS: 20.0000 mg | ORAL_TABLET | Freq: Every day | ORAL | 1 refills | Status: DC

## 2021-02-13 NOTE — Progress Notes (Signed)
Amanda [name] [id number] [birth year] 61 y.o.  Subjective:   Patient ID:  Amanda [name] is a 61 y.o. (DOB Nov 25, 1960) female.  Chief Complaint:  Chief Complaint  Patient presents with   Insomnia   Depression    Insomnia PMH includes: depression.   Depression        Associated symptoms include insomnia. Amanda [name] presents to the office today for follow-up of depression and anxiety.   She reports that she continues not to sleep well. She reports that she saw no improvement in Trazodone. She is awake until about 4 am.   She reports that she had some depression over Thanksgiving for about 4 days and then it resolved. She reports that she was unable to identify a trigger. She reports that her energy and motivation is lower. Having diminished interest in things. Daughter is 62 yo and has been having some difficulties- "and that bothers me." She reports worry about daughter. Will frequently check on her daughter. She reports that she has less negative thoughts with Lexapro and can "cut that off." She reports that she tries to take one day at a time. She reports that she has days where she wakes up and feels more down and has lower energy and motivation. "I feel like I am lazy and I have never felt that way." Less interested in things. Enjoys spending time with family and recent trip. Concentration has been good and seems to be improved. Appetite has been decreased. No change in appetite. Denies SI.   She reports that this is their busy season at work.   She and daughter had food poisoning on trip.   Past Psychiatric Medication Trials: Wellbutrin XL- No improvement at 300 mg dose. Trintellix Lexapro- "felt the best on." Reports that she had some hair loss Zoloft Celexa Viibryd Prozac Effexor Remeron Ambien-Effective and then no longer effective. Sleep was not restful. Parasomnias. Lunesta-Ineffective Belsomra-Ineffective Trazodone-ineffective.   Doxepin-Ineffective Klonopin xanax Gabapentin Abilify Deplin   Flowsheet Row ED to Hosp-Admission (Discharged) from 03/21/2020 in [facility] Most recent reading at 03/22/2020 10:02 PM ED from 03/21/2020 in [facility] Most recent reading at 03/21/2020  3:29 PM  C-SSRS RISK CATEGORY No Risk No Risk        Review of Systems:  Review of Systems  Cardiovascular:  Negative for palpitations.  Musculoskeletal:  Negative for gait problem.  Psychiatric/Behavioral:  Positive for depression. The patient has insomnia.        Please refer to HPI   Medications: I have reviewed the patient's current medications.  Current Outpatient Medications  Medication Sig Dispense Refill   ALPRAZolam (XANAX) 1 MG tablet TAKE 1 TABLET AT BEDTIME AND 1/2-1 TABLET AS NEEDED FOR ANXIETY. 45 tablet 5   Cholecalciferol (VITAMIN D3 PO) Take 100 mcg by mouth daily.     Cimetidine (TAGAMET PO) Take 1 tablet by mouth daily as needed (heartburn).     Daridorexant HCl (QUVIVIQ) 50 MG TABS Take 50 mg by mouth at bedtime. 30 tablet 2   diphenhydramine-acetaminophen (TYLENOL PM) 25-500 MG TABS tablet Take 1 tablet by mouth at bedtime as needed.     ESOMEPRAZOLE MAGNESIUM PO Take 20 mg by mouth daily as needed (heartburn).     Evolocumab (REPATHA SURECLICK) 010 MG/ML SOAJ Inject 1 pen into the skin every 14 (fourteen) days. 2 mL 11   hydrochlorothiazide (MICROZIDE) 12.5 MG capsule Take 6.25 mg by mouth daily. \  lamoTRIgine (LAMICTAL) 100 MG tablet Take 1 tab po qd x 2 weeks, then increase to 1.5 tabs po qd 45 tablet 1   lamoTRIgine (LAMICTAL) 25 MG tablet Take 1 tablet (25 mg total) by mouth daily for 14 days, THEN 2 tablets (50 mg total) daily for 14 days. 45 tablet 0   metoprolol tartrate (LOPRESSOR) 25 MG tablet Take 25 mg by mouth 2 (two) times daily.     vitamin B-12 (CYANOCOBALAMIN) 1000 MCG tablet Take 1,000 mcg by mouth.     escitalopram (LEXAPRO) 20 MG tablet  Take 1 tablet (20 mg total) by mouth daily. 90 tablet 1   rizatriptan (MAXALT) 10 MG tablet Take 10 mg by mouth as needed for migraine.     No current facility-administered medications for this visit.    Medication Side Effects: None  Allergies:  Allergies  Allergen Reactions   Ace Inhibitors     REACTION: cough   Ambien [Zolpidem]     Other reaction(s): headache/neck thumping   Amoxicillin     Gi upset   Codeine Nausea And Vomiting    Can tolerate hydrocodone if has food on stomach Can tolerate hydrocodone if has food on stomach    Cortisone     Extremely high BP   Crestor [Rosuvastatin]     Other reaction(s): facial rash   Felodipine     Other reaction(s): increased flushing and made her feel bad   Hydrocodone-Acetaminophen     REACTION: vomitting   Lisinopril Cough   Losartan Potassium     REACTION: cough   Nexletol [Bempedoic Acid]     Brain fog   Statins     Face and leg rash with rosuvastatin, pravastatin, and atorvastatin   Venlafaxine     Other reaction(s): headaches/elevated blood pressure   Zetia [Ezetimibe] Diarrhea    Past Medical History:  Diagnosis Date   Anxiety    Asthma    Chronic kidney disease    Chronic kidney disease, stage 3 ([place]) 08/10/2019   Depression    Hyperlipidemia    Hypertension    Migraine     Past Medical History, Surgical history, Social history, and Family history were reviewed and updated as appropriate.   Please see review of systems for further details on the patient's review from today.   Objective:   Physical Exam:  LMP 03/23/2011   Physical Exam Constitutional:      General: She is not in acute distress. Musculoskeletal:        General: No deformity.  Neurological:     Mental Status: She is alert and oriented to person, place, and time.     Coordination: Coordination normal.  Psychiatric:        Attention and Perception: Attention and perception normal. She does not perceive auditory or visual  hallucinations.        Mood and Affect: Mood is depressed. Mood is not anxious. Affect is not labile, blunt, angry or inappropriate.        Speech: Speech normal.        Behavior: Behavior normal.        Thought Content: Thought content normal. Thought content is not paranoid or delusional. Thought content does not include homicidal or suicidal ideation. Thought content does not include homicidal or suicidal plan.        Cognition and Memory: Cognition and memory normal.        Judgment: Judgment normal.     Comments: Insight intact    Lab  Review:     Component Value Date/Time   NA 137 03/24/2020 0214   NA 138 11/03/2019 1107   K 3.7 03/24/2020 0214   CL 106 03/24/2020 0214   CO2 23 03/24/2020 0214   GLUCOSE 95 03/24/2020 0214   BUN 9 03/24/2020 0214   BUN 17 11/03/2019 1107   CREATININE 0.94 03/24/2020 0214   CALCIUM 8.9 03/24/2020 0214   PROT 6.6 07/03/2020 1128   ALBUMIN 3.9 07/03/2020 1128   AST 9 07/03/2020 1128   ALT 13 07/03/2020 1128   ALKPHOS 106 07/03/2020 1128   BILITOT 0.5 07/03/2020 1128   GFRNONAA >60 03/24/2020 0214   GFRAA 38 (L) 11/03/2019 1107       Component Value Date/Time   WBC 7.9 03/24/2020 0214   RBC 3.87 03/24/2020 0214   HGB 11.9 (L) 03/24/2020 0214   HCT 34.3 (L) 03/24/2020 0214   PLT 273 03/24/2020 0214   MCV 88.6 03/24/2020 0214   MCH 30.7 03/24/2020 0214   MCHC 34.7 03/24/2020 0214   RDW 12.2 03/24/2020 0214   LYMPHSABS 2.4 04/04/2011 1622   MONOABS 0.7 04/04/2011 1622   EOSABS 0.1 04/04/2011 1622   BASOSABS 0.1 04/04/2011 1622    No results found for: POCLITH, LITHIUM   No results found for: PHENYTOIN, PHENOBARB, VALPROATE, CBMZ   .res Assessment: Plan:   Pt seen for 30 minutes and time spent discussing treatment options for depression and insomnia. Counseled patient regarding potential benefits, risks, and side effects of Lamictal to include potential risk of Stevens-Johnson syndrome. Advised patient to stop taking Lamictal and  contact office immediately if rash develops and to seek urgent medical attention if rash is severe and/or spreading quickly. Discussed that Lamictal is used off-label for depression. Will start Lamictal 25 mg daily for 2 weeks, then increase to 50 mg daily for 2 weeks, then 100 mg daily for 2 weeks, then 150 mg daily for mood symptoms.  Discussed potential benefits, risks, and side effects of Quviviq for insomnia. Pt agrees to trial of Quviviq. Will start Quviviq 50 mg po QHS for insomnia.  Continue Lexapro 20 mg po qd for depression and anxiety.  Continue Xanax 1 mg at bedtime and 1/2-1 tab po qd prn anxiety.  Pt to follow-up in 3 months or sooner if clinically indicated.  Patient advised to contact office with any questions, adverse effects, or acute worsening in signs and symptoms.  Amanda [name] was seen today for insomnia and depression.  Diagnoses and all orders for this visit:  Chronic insomnia -     Daridorexant HCl (QUVIVIQ) 50 MG TABS; Take 50 mg by mouth at bedtime.  Depression, unspecified depression type -     lamoTRIgine (LAMICTAL) 25 MG tablet; Take 1 tablet (25 mg total) by mouth daily for 14 days, THEN 2 tablets (50 mg total) daily for 14 days. -     lamoTRIgine (LAMICTAL) 100 MG tablet; Take 1 tab po qd x 2 weeks, then increase to 1.5 tabs po qd -     escitalopram (LEXAPRO) 20 MG tablet; Take 1 tablet (20 mg total) by mouth daily.  Anxiety -     escitalopram (LEXAPRO) 20 MG tablet; Take 1 tablet (20 mg total) by mouth daily.     Please see After Visit Summary for patient specific instructions.  Future Appointments  Date Time Provider [place]  05/17/2021  1:30 PM Thayer Headings, PMHNP CP-CP None    No orders of the defined types were placed in this encounter.   -------------------------------

## 2021-02-16 ENCOUNTER — Telehealth: Payer: Self-pay | Admitting: Psychiatry

## 2021-02-16 DIAGNOSIS — F5104 Psychophysiologic insomnia: Secondary | ICD-10-CM

## 2021-02-16 MED ORDER — DAYVIGO 5 MG PO TABS: 5.0000 mg | ORAL_TABLET | Freq: Every day | ORAL | 0 refills | Status: DC

## 2021-02-16 NOTE — Telephone Encounter (Signed)
Please review

## 2021-02-16 NOTE — Telephone Encounter (Signed)
Please let her know that Jarold Motto was sent to her pharmacy for #10 tabs. She can download a free-trial offer from the [facility] website to determine if Jarold Motto is helpful. There is a copay savings card and a voucher. It is the free trial voucher she will want to download and take to pharmacy. Please advise her to call the office after she knows how she is responding to [facility]. Can then either send in a script for 30-day and try to get insurance approval or try another option.

## 2021-02-16 NOTE — Telephone Encounter (Signed)
Patient lm stating Janett Billow gave her a Rx card to obtain a sleep medication. Patient states the Rx card did not work, and the medication cost is $576. Charon is inquiring if there is anything else she can try for sleep. Direct call to # [phone].

## 2021-02-16 NOTE — Telephone Encounter (Signed)
LVM to rtc 

## 2021-02-19 NOTE — Telephone Encounter (Signed)
Patient lvm regarding previous message. States that she is returning call from Friday about a sleep medication that [place] changed. The "other medication that [place] added for depression has given her a rash and she has stopped taking." Pls rtc to discuss [phone]

## 2021-02-19 NOTE — Telephone Encounter (Signed)
Pt returned phone call at [phone].Please call her back

## 2021-02-20 NOTE — Telephone Encounter (Signed)
Has her rash resolved? Is it spreading? What parts of her body did she have the rash?

## 2021-02-20 NOTE — Telephone Encounter (Signed)
Pt will try to get dayvigo today.She also stopped taking lamotrigine due to developing a rash.She stopped taking on sunday

## 2021-02-20 NOTE — Telephone Encounter (Signed)
The rash was on her legs and face.She stated it was little red itchy blister like bumps.She has noticed that it is going away since she stopped taking med

## 2021-02-28 ENCOUNTER — Encounter: Payer: Self-pay | Admitting: Psychiatry

## 2021-02-28 ENCOUNTER — Telehealth: Payer: Self-pay | Admitting: Psychiatry

## 2021-02-28 ENCOUNTER — Telehealth: Payer: Self-pay

## 2021-02-28 DIAGNOSIS — F32A Depression, unspecified: Secondary | ICD-10-CM

## 2021-02-28 DIAGNOSIS — F5104 Psychophysiologic insomnia: Secondary | ICD-10-CM

## 2021-02-28 MED ORDER — DAYVIGO 10 MG PO TABS: 10.0000 mg | ORAL_TABLET | Freq: Every day | ORAL | 2 refills | Status: DC

## 2021-02-28 MED ORDER — REXULTI 0.5 MG PO TABS: ORAL_TABLET | ORAL | 0 refills | Status: DC

## 2021-02-28 MED ORDER — BREXPIPRAZOLE 1 MG PO TABS: 1.0000 mg | ORAL_TABLET | Freq: Every day | ORAL | 2 refills | Status: DC

## 2021-02-28 NOTE — Telephone Encounter (Signed)
Returned call to patient. She reports that she developed "little blisters" on her left leg and her face. She reports that the blisters were stinging and itching. She reports that it was relieved with Hydrocortisone cream. Some relief with topical benadryl.  She reports that she stopped Lamictal and it took days to clear up. She reports that the blisters and rash has cleared up with some slight redness where the areas were. She reports that Jarold Motto is helping some with sleep and it is no longer taking as long to fall asleep. She reports that she is continuing to experience depression, low energy, and low motivation. She would like to continue Lexapro. Discussed potential benefits, risks, and side effects of Rexulti. Discussed potential metabolic side effects associated with atypical antipsychotics, as well as potential risk for movement side effects. Advised pt to contact office if movement side effects occur.   Will pull samples of Rexulti and send script to pharmacy to initiate PA for Rexulti (previous trial of Abilify). Script sent for Dayvigo 10 mg po QHS for insomnia.   Patient advised to contact office with any questions, adverse effects, or acute worsening in signs and symptoms.

## 2021-02-28 NOTE — Telephone Encounter (Signed)
Amanda [name] called and said that she never heard back about the rash that the Lamotrigine was causing. She states that the [facility] is helping with sleep and takes 2 night. She is requesting a refill on this called to   [place], [place] - [address]  [place], [place] [address]  Phone:  [phone]  Fax:  [phone]  DEA #:  [id number]  [place] Reason: --    She states that the Lamotrigine caused a rash and she stopped it. Can she take something else to replace this? Please call her at [phone].

## 2021-02-28 NOTE — Telephone Encounter (Signed)
Rtc to pt.She stated the rash is still present but it's going away and is better then before.She would like the dayvigo sent in a full rx and wants something to replace lamotrigine

## 2021-02-28 NOTE — Telephone Encounter (Signed)
Prior Authorization submitted and approved for REXULTI 1 MG effective 02/28/2021-02/27/2022 with [employer] ID# [id number]

## 2021-03-07 ENCOUNTER — Telehealth: Payer: Self-pay

## 2021-03-07 DIAGNOSIS — F5104 Psychophysiologic insomnia: Secondary | ICD-10-CM

## 2021-03-07 MED ORDER — ZALEPLON 10 MG PO CAPS: 10.0000 mg | ORAL_CAPSULE | Freq: Every evening | ORAL | 1 refills | Status: DC | PRN

## 2021-03-07 NOTE — Telephone Encounter (Signed)
Prior Authorization submitted and DENIED for DAYVIGO 10 MG the request does not meet the definition of medical necessity under the pt's benefit plan with [employer].  The pt must try and fail ALL formulary alternatives which they should refer to the [facility] formulary, two listed on report were zaleplon, ramelteon, etc

## 2021-03-13 ENCOUNTER — Other Ambulatory Visit: Payer: Self-pay | Admitting: Cardiovascular Disease

## 2021-03-13 ENCOUNTER — Telehealth: Payer: Self-pay

## 2021-03-13 NOTE — Telephone Encounter (Signed)
The patient called and was requesting a refill on Repatha. She states she received a text from her pharmacy that we were not going to refill the [place].   Reviewed the chart and advised the patient that the rx had been sent over this morning and for her to contact her pharmacy. She stated she would give her pharmacy a call and thanked me for my help.

## 2021-03-23 ENCOUNTER — Telehealth: Payer: Self-pay | Admitting: Psychiatry

## 2021-03-23 ENCOUNTER — Other Ambulatory Visit: Payer: Self-pay

## 2021-03-23 DIAGNOSIS — F32A Depression, unspecified: Secondary | ICD-10-CM

## 2021-03-23 MED ORDER — BREXPIPRAZOLE 1 MG PO TABS: 1.0000 mg | ORAL_TABLET | Freq: Every day | ORAL | 0 refills | Status: DC

## 2021-03-23 NOTE — Telephone Encounter (Signed)
Pt called and said that she has been taking samples of rexulti 1 mg. This medicine is working great and she would like to have a script of the rexulti 1 mg be sent into the [facility] on [address]

## 2021-03-23 NOTE — Telephone Encounter (Signed)
Rx sent 

## 2021-04-16 ENCOUNTER — Emergency Department (HOSPITAL_COMMUNITY): Payer: BC Managed Care – PPO

## 2021-04-16 ENCOUNTER — Encounter (HOSPITAL_COMMUNITY): Payer: Self-pay

## 2021-04-16 ENCOUNTER — Observation Stay (HOSPITAL_COMMUNITY)
Admission: EM | Admit: 2021-04-16 | Discharge: 2021-04-18 | Disposition: A | Discharge status: Home / Self Care | Payer: BC Managed Care – PPO | Attending: Internal Medicine | Admitting: Internal Medicine

## 2021-04-16 ENCOUNTER — Other Ambulatory Visit: Payer: Self-pay

## 2021-04-16 DIAGNOSIS — R55 Syncope and collapse: Secondary | ICD-10-CM | POA: Diagnosis present

## 2021-04-16 DIAGNOSIS — F411 Generalized anxiety disorder: Secondary | ICD-10-CM | POA: Diagnosis present

## 2021-04-16 DIAGNOSIS — R42 Dizziness and giddiness: Secondary | ICD-10-CM | POA: Diagnosis not present

## 2021-04-16 DIAGNOSIS — I129 Hypertensive chronic kidney disease with stage 1 through stage 4 chronic kidney disease, or unspecified chronic kidney disease: Secondary | ICD-10-CM | POA: Insufficient documentation

## 2021-04-16 DIAGNOSIS — K219 Gastro-esophageal reflux disease without esophagitis: Secondary | ICD-10-CM

## 2021-04-16 DIAGNOSIS — Z87891 Personal history of nicotine dependence: Secondary | ICD-10-CM | POA: Diagnosis not present

## 2021-04-16 DIAGNOSIS — I951 Orthostatic hypotension: Principal | ICD-10-CM | POA: Insufficient documentation

## 2021-04-16 DIAGNOSIS — N1831 Chronic kidney disease, stage 3a: Secondary | ICD-10-CM | POA: Diagnosis not present

## 2021-04-16 DIAGNOSIS — Z79899 Other long term (current) drug therapy: Secondary | ICD-10-CM | POA: Diagnosis not present

## 2021-04-16 DIAGNOSIS — T391X1A Poisoning by 4-Aminophenol derivatives, accidental (unintentional), initial encounter: Secondary | ICD-10-CM | POA: Diagnosis not present

## 2021-04-16 DIAGNOSIS — R609 Edema, unspecified: Secondary | ICD-10-CM | POA: Diagnosis not present

## 2021-04-16 DIAGNOSIS — N179 Acute kidney failure, unspecified: Secondary | ICD-10-CM | POA: Insufficient documentation

## 2021-04-16 DIAGNOSIS — I959 Hypotension, unspecified: Secondary | ICD-10-CM | POA: Diagnosis not present

## 2021-04-16 DIAGNOSIS — E871 Hypo-osmolality and hyponatremia: Secondary | ICD-10-CM | POA: Insufficient documentation

## 2021-04-16 DIAGNOSIS — E860 Dehydration: Secondary | ICD-10-CM | POA: Diagnosis not present

## 2021-04-16 DIAGNOSIS — E876 Hypokalemia: Secondary | ICD-10-CM | POA: Insufficient documentation

## 2021-04-16 DIAGNOSIS — J45909 Unspecified asthma, uncomplicated: Secondary | ICD-10-CM | POA: Diagnosis not present

## 2021-04-16 DIAGNOSIS — R000 Tachycardia, unspecified: Secondary | ICD-10-CM | POA: Diagnosis not present

## 2021-04-16 DIAGNOSIS — I10 Essential (primary) hypertension: Secondary | ICD-10-CM | POA: Diagnosis present

## 2021-04-16 LAB — CBC WITH DIFFERENTIAL/PLATELET
Abs Immature Granulocytes: 0.03 10*3/uL (ref 0.00–0.07)
Basophils Absolute: 0 10*3/uL (ref 0.0–0.1)
Basophils Relative: 1 %
Eosinophils Absolute: 0.1 10*3/uL (ref 0.0–0.5)
Eosinophils Relative: 2 %
HCT: 39.2 % (ref 36.0–46.0)
Hemoglobin: 13.5 g/dL (ref 12.0–15.0)
Immature Granulocytes: 1 %
Lymphocytes Relative: 27 %
Lymphs Abs: 1.8 10*3/uL (ref 0.7–4.0)
MCH: 30.3 pg (ref 26.0–34.0)
MCHC: 34.4 g/dL (ref 30.0–36.0)
MCV: 88.1 fL (ref 80.0–100.0)
Monocytes Absolute: 0.6 10*3/uL (ref 0.1–1.0)
Monocytes Relative: 9 %
Neutro Abs: 4 10*3/uL (ref 1.7–7.7)
Neutrophils Relative %: 60 %
Platelets: 322 10*3/uL (ref 150–400)
RBC: 4.45 MIL/uL (ref 3.87–5.11)
RDW: 12 % (ref 11.5–15.5)
WBC: 6.5 10*3/uL (ref 4.0–10.5)
nRBC: 0 % (ref 0.0–0.2)

## 2021-04-16 LAB — COMPREHENSIVE METABOLIC PANEL
ALT: 29 U/L (ref 0–44)
AST: 24 U/L (ref 15–41)
Albumin: 4.3 g/dL (ref 3.5–5.0)
Alkaline Phosphatase: 60 U/L (ref 38–126)
Anion gap: 11 (ref 5–15)
BUN: 39 mg/dL — ABNORMAL HIGH (ref 6–20)
CO2: 24 mmol/L (ref 22–32)
Calcium: 9.6 mg/dL (ref 8.9–10.3)
Chloride: 97 mmol/L — ABNORMAL LOW (ref 98–111)
Creatinine, Ser: 1.21 mg/dL — ABNORMAL HIGH (ref 0.44–1.00)
GFR, Estimated: 51 mL/min — ABNORMAL LOW (ref >60)
Glucose, Bld: 139 mg/dL — ABNORMAL HIGH (ref 70–99)
Potassium: 3.4 mmol/L — ABNORMAL LOW (ref 3.5–5.1)
Sodium: 132 mmol/L — ABNORMAL LOW (ref 135–145)
Total Bilirubin: 0.3 mg/dL (ref 0.3–1.2)
Total Protein: 7.5 g/dL (ref 6.5–8.1)

## 2021-04-16 LAB — URINALYSIS, ROUTINE W REFLEX MICROSCOPIC
Bilirubin Urine: NEGATIVE
Glucose, UA: 150 mg/dL — AB
Hgb urine dipstick: NEGATIVE
Ketones, ur: NEGATIVE mg/dL
Leukocytes,Ua: NEGATIVE
Nitrite: NEGATIVE
Protein, ur: NEGATIVE mg/dL
Specific Gravity, Urine: 1.004 — ABNORMAL LOW (ref 1.005–1.030)
pH: 8 (ref 5.0–8.0)

## 2021-04-16 LAB — MAGNESIUM: Magnesium: 2.2 mg/dL (ref 1.7–2.4)

## 2021-04-16 LAB — D-DIMER, QUANTITATIVE: D-Dimer, Quant: 0.86 ug/mL-FEU — ABNORMAL HIGH (ref 0.00–0.50)

## 2021-04-16 LAB — LACTIC ACID, PLASMA: Lactic Acid, Venous: 1.8 mmol/L (ref 0.5–1.9)

## 2021-04-16 LAB — TSH: TSH: 1.274 u[IU]/mL (ref 0.350–4.500)

## 2021-04-16 LAB — TROPONIN I (HIGH SENSITIVITY): Troponin I (High Sensitivity): 3 ng/L (ref <18)

## 2021-04-16 MED ORDER — ENOXAPARIN SODIUM 40 MG/0.4ML IJ SOSY
40.0000 mg | PREFILLED_SYRINGE | INTRAMUSCULAR | Status: DC
  Administered 2021-04-16 – 2021-04-17 (×2): 40 mg via SUBCUTANEOUS
  Filled 2021-04-16 (×2): qty 0.4

## 2021-04-16 MED ORDER — SODIUM CHLORIDE 0.9 % IV SOLN: INTRAVENOUS | Status: DC

## 2021-04-16 MED ORDER — PANTOPRAZOLE SODIUM 40 MG PO TBEC
40.0000 mg | DELAYED_RELEASE_TABLET | Freq: Every day | ORAL | Status: DC
  Administered 2021-04-16 – 2021-04-18 (×3): 40 mg via ORAL
  Filled 2021-04-16 (×3): qty 1

## 2021-04-16 MED ORDER — POTASSIUM CHLORIDE CRYS ER 20 MEQ PO TBCR
40.0000 meq | EXTENDED_RELEASE_TABLET | Freq: Every day | ORAL | Status: DC
  Administered 2021-04-16: 40 meq via ORAL
  Filled 2021-04-16 (×2): qty 2

## 2021-04-16 MED ORDER — LACTATED RINGERS IV BOLUS
1000.0000 mL | Freq: Once | INTRAVENOUS | Status: AC
  Administered 2021-04-16: 1000 mL via INTRAVENOUS

## 2021-04-16 MED ORDER — LOPERAMIDE HCL 2 MG PO CAPS
2.0000 mg | ORAL_CAPSULE | Freq: Once | ORAL | Status: AC
  Administered 2021-04-16: 2 mg via ORAL
  Filled 2021-04-16: qty 1

## 2021-04-16 MED ORDER — ONDANSETRON HCL 4 MG PO TABS: 4.0000 mg | ORAL_TABLET | Freq: Four times a day (QID) | ORAL | Status: DC | PRN

## 2021-04-16 MED ORDER — ONDANSETRON HCL 4 MG/2ML IJ SOLN
4.0000 mg | Freq: Four times a day (QID) | INTRAMUSCULAR | Status: DC | PRN
  Administered 2021-04-17 – 2021-04-18 (×2): 4 mg via INTRAVENOUS
  Filled 2021-04-16 (×2): qty 2

## 2021-04-16 MED ORDER — LACTATED RINGERS IV SOLN
INTRAVENOUS | Status: DC
  Administered 2021-04-16: 125 mL/h via INTRAVENOUS

## 2021-04-16 MED ORDER — BREXPIPRAZOLE 1 MG PO TABS
1.0000 mg | ORAL_TABLET | Freq: Every morning | ORAL | Status: DC
Start: 2021-04-17 — End: 2021-04-18
  Administered 2021-04-17 – 2021-04-18 (×2): 1 mg via ORAL
  Filled 2021-04-16 (×2): qty 1

## 2021-04-16 MED ORDER — SODIUM CHLORIDE 0.9% FLUSH
3.0000 mL | Freq: Two times a day (BID) | INTRAVENOUS | Status: DC
  Administered 2021-04-16: 3 mL via INTRAVENOUS

## 2021-04-16 MED ORDER — ALPRAZOLAM 1 MG PO TABS
1.0000 mg | ORAL_TABLET | Freq: Every day | ORAL | Status: DC
  Administered 2021-04-16 – 2021-04-17 (×2): 1 mg via ORAL
  Filled 2021-04-16 (×2): qty 1

## 2021-04-16 MED ORDER — METOPROLOL TARTRATE 25 MG PO TABS
25.0000 mg | ORAL_TABLET | Freq: Two times a day (BID) | ORAL | Status: DC
  Administered 2021-04-16 – 2021-04-18 (×4): 25 mg via ORAL
  Filled 2021-04-16 (×4): qty 1

## 2021-04-16 NOTE — ED Triage Notes (Signed)
Per EMS, Pt, from home, c/o weakness and dry mouth after taking "a Delta 9 product and Tylenol PM x2" last night.  Hx of insomnia.  Denies pain.  A &Ox4.  ?

## 2021-04-16 NOTE — H&P (Signed)
?History and Physical  ? ? ?Patient: Amanda [name] TIR:[id number] DOB: October 12, 1960 ?DOA: 04/16/2021 ?DOS: the patient was seen and examined on 04/16/2021 ?PCP: Antony Contras, MD  ?Patient coming from: Home ? ?Chief Complaint:  ?Chief Complaint  ?Patient presents with  ? Medication Reaction  ? Weakness  ? ?HPI: Amanda [name] is a 61 y.o. female with medical history significant of HTN, anxiety, headaches, insomnia. Presenting after a syncopal episode. History is from family at bedside. The patient was in her normal state of health until last night. Before going to bed, she took a hemp edible and 2 APAP PM. She felt a little lightheaded, but dismissed it. This morning she woke and went to the bathroom. When she tried to stand up from the toilet, she had a witnessed syncopal event. She fell into her family's arms. She did not hit her head. She was "out of it" for a few minutes. There was not tonic-clonic jerks, foaming at the mouth, or incontinence. EMS was alerted. The family reports that she has started the "[place]" and recently added a new anti-anxiety medication to her regimen. Otherwise, they deny and other aggravating or alleviating factors.   ? ?Review of Systems: As mentioned in the history of present illness. All other systems reviewed and are negative. ?Past Medical History:  ?Diagnosis Date  ? Anxiety   ? Asthma   ? Chronic kidney disease   ? Chronic kidney disease, stage 3 ([place]) 08/10/2019  ? Depression   ? Hyperlipidemia   ? Hypertension   ? Migraine   ? ?Past Surgical History:  ?Procedure Laterality Date  ? APPENDECTOMY    ? CARPAL TUNNEL RELEASE    ? rt  ? FOOT SURGERY Bilateral   ? Plantar fasciitis  ? OOPHORECTOMY    ? ?Social History:  reports that she quit smoking about 34 years ago. Her smoking use included cigarettes. She has a 2.50 pack-year smoking history. She has never used smokeless tobacco. She reports current alcohol use. She reports that she does not use drugs. ? ?Allergies  ?Allergen  Reactions  ? Ace Inhibitors   ?  REACTION: cough  ? Ambien [Zolpidem]   ?  Other reaction(s): headache/neck thumping  ? Amoxicillin   ?  Gi upset  ? Codeine Nausea And Vomiting  ?  Can tolerate hydrocodone if has food on stomach ?Can tolerate hydrocodone if has food on stomach ?  ? Cortisone   ?  Extremely high BP  ? Crestor [Rosuvastatin]   ?  Other reaction(s): facial rash  ? Felodipine   ?  Other reaction(s): increased flushing and made her feel bad  ? Hydrocodone-Acetaminophen   ?  REACTION: vomitting  ? Lisinopril Cough  ? Losartan Potassium   ?  REACTION: cough  ? Nexletol [Bempedoic Acid]   ?  Brain fog  ? Statins   ?  Face and leg rash with rosuvastatin, pravastatin, and atorvastatin  ? Venlafaxine   ?  Other reaction(s): headaches/elevated blood pressure  ? Zetia [Ezetimibe] Diarrhea  ? Lamictal [Lamotrigine] Rash  ? ? ?Family History  ?Problem Relation Age of Onset  ? Elevated Lipids Mother   ? Asthma Father   ? Heart disease Father   ?     CAD s/p CABG  ? Elevated Lipids Father   ? Depression Father   ? Asthma Sister   ? Elevated Lipids Sister   ? Hypertension Sister   ? Depression Sister   ? Elevated Lipids Sister   ?  Hypertension Sister   ? Bipolar disorder Sister   ? Elevated Lipids Sister   ? Hypertension Sister   ? Depression Sister   ? Asthma Paternal Aunt   ? Breast cancer Neg Hx   ? ? ?Prior to Admission medications   ?Medication Sig Start Date End Date Taking? Authorizing Provider  ?ALPRAZolam (XANAX) 1 MG tablet TAKE 1 TABLET AT BEDTIME AND 1/2-1 TABLET AS NEEDED FOR ANXIETY. 12/15/20   Thayer Headings, [place]  ?Brexpiprazole (REXULTI) 0.5 MG TABS Start Rexulti 0.5 mg daily for one week, then increase to 1 mg daily 02/28/21   Thayer Headings, PMHNP  ?brexpiprazole (REXULTI) 1 MG TABS tablet Take 1 tablet (1 mg total) by mouth daily. 03/23/21 04/22/21  Thayer Headings, [place]  ?Cholecalciferol (VITAMIN D3 PO) Take 100 mcg by mouth daily.    [provider]  ?Cimetidine (TAGAMET PO) Take 1  tablet by mouth daily as needed (heartburn).    [provider]  ?diphenhydramine-acetaminophen (TYLENOL PM) 25-500 MG TABS tablet Take 1 tablet by mouth at bedtime as needed.    [provider]  ?escitalopram (LEXAPRO) 20 MG tablet Take 1 tablet (20 mg total) by mouth daily. 02/13/21   Thayer Headings, PMHNP  ?ESOMEPRAZOLE MAGNESIUM PO Take 20 mg by mouth daily as needed (heartburn).    [provider]  ?hydrochlorothiazide (MICROZIDE) 12.5 MG capsule Take 6.25 mg by mouth daily. \    [provider]  ?metoprolol tartrate (LOPRESSOR) 25 MG tablet Take 25 mg by mouth 2 (two) times daily. 04/16/13   [provider]  ?REPATHA SURECLICK 106 MG/ML SOAJ INJECT 1 PEN INTO THE SKIN EVERY 14 DAYS 03/13/21   Burnell Blanks, MD  ?rizatriptan (MAXALT) 10 MG tablet Take 10 mg by mouth as needed for migraine.    [provider]  ?vitamin B-12 (CYANOCOBALAMIN) 1000 MCG tablet Take 1,000 mcg by mouth.    [provider]  ?zaleplon (SONATA) 10 MG capsule Take 1 capsule (10 mg total) by mouth at bedtime as needed for sleep. 03/07/21   Thayer Headings, [place]  ? ? ?Physical Exam: ?Vitals:  ? 04/16/21 0932 04/16/21 1030 04/16/21 1100 04/16/21 1130  ?BP: 137/70 121/71 (!) 148/83 140/89  ?Pulse: (!) 112 (!) 103 (!) 104 (!) 111  ?Resp: '14 18 15 16  '$ ?Temp: 97.9 ?F (36.6 ?C)     ?TempSrc: Oral     ?SpO2: 98% 97% 100% 97%  ?Weight:      ?Height:      ? ?General: 61 y.o. female resting in bed in NAD ?Eyes: PERRL, normal sclera ?ENMT: Nares patent w/o discharge, orophaynx clear, dentition normal, ears w/o discharge/lesions/ulcers ?Neck: Supple, trachea midline ?Cardiovascular: tachy, +S1, S2, no m/g/r, equal pulses throughout ?Respiratory: CTABL, no w/r/r, normal WOB ?GI: BS+, NDNT, no masses noted, no organomegaly noted ?MSK: No e/c/c ?Neuro: A&O x 3, no focal deficits ?Psyc: Appropriate interaction and affect, calm/cooperative ? ?Data Reviewed: ? ?Na+  132 ?K+ 3.4 ?Glucose   139 ?BUN  39 ?SCr  1.21 ? ?EKG   sinus tach, no st elevation ?CXR: No active disease. ? ?Assessment and Plan: ?No notes have been filed under this hospital service. ?Service: Hospitalist ?Syncope ?    - place in obs, tele ?    - EKG w/ sinus tach ?    - check d-dimer ?    - CXR negative; no respiratory symptoms; UA pending; No fever and white count is normal; doesn't have the feel of infection ?    -  her recent diet could have some play here ?    - check echo, continue fluids ?    - check TSH ? ?AKI ?    - check renal US ?    - watch nephrotoxins ?    - continue fluids ? ?Hypokalemia ?Hyponatremia ?    - Replace K+, Mg2+ is ok ?    - mild hypoNa+, follow ? ?HTN ?    - hold HCTZ for now d/t hyponatremia ? ?Anxiety ?Depression ?    - hold lexapro for now d/t hyponatremia ? ?GERD ?    - PPI ? ? Advance Care Planning:   Code Status: FULL ? ?Consults: None at bedside ? ?Family Communication: w/ family at bedside ? ?Severity of Illness: ?The appropriate patient status for this patient is OBSERVATION. Observation status is judged to be reasonable and necessary in order to provide the required intensity of service to ensure the patient's safety. The patient's presenting symptoms, physical exam findings, and initial radiographic and laboratory data in the context of their medical condition is felt to place them at decreased risk for further clinical deterioration. Furthermore, it is anticipated that the patient will be medically stable for discharge from the hospital within 2 midnights of admission.  ? ?Author: ?Jonnie Finner, DO ?04/16/2021 11:51 AM ? ?For on call review [email or web address].  ?

## 2021-04-16 NOTE — ED Provider Notes (Addendum)
?[facility] DEPT ?Provider Note ? ? ?CSN: [id number] ?Arrival date & time: 04/16/21  8786 ? ?  ? ?History ? ?Chief Complaint  ?Patient presents with  ? Medication Reaction  ? Weakness  ? ? ?Amanda [name] is a 61 y.o. female. ? ? ?Weakness ? ?61 year old female presenting to the emergency department with a complaint of syncope, generalized weakness and a feeling of being dehydrated.  The patient normally takes a Tylenol PM before bed to help sleep.  She took 2 Tylenol PMs last night and ate delta 9 gummy.  She is on currently a diet that includes weighing herself, hydrating herself with fluids and electrolyte-based solutions.  She was sitting on the toilet to use the bathroom and stood up to weigh herself when she became lightheaded and had episode of syncope.  It was a witnessed episode with no significant head trauma.  The patient's daughter states that she was "out of it" for a period of time with no seizure-like activity.  No facial droop, numbness or focal weakness or deficit.  The patient was unable to speak during this event but had no clear aphasia or dysarthria.  When she came to after a few minutes her speech was normal and she had no deficits.  She states that she feels dehydrated with a very dry mouth.  She denied any chest pain or shortness of breath.  She endorsed a prodrome sensation of nausea, lightheadedness, slight palpitations prior to her syncopal event. ? ?Home Medications ?Prior to Admission medications   ?Medication Sig Start Date End Date Taking? Authorizing Provider  ?ALPRAZolam (XANAX) 1 MG tablet TAKE 1 TABLET AT BEDTIME AND 1/2-1 TABLET AS NEEDED FOR ANXIETY. ?Patient taking differently: Take 1 mg by mouth at bedtime. 12/15/20  Yes Thayer Headings, PMHNP  ?brexpiprazole (REXULTI) 1 MG TABS tablet Take 1 tablet (1 mg total) by mouth daily. ?Patient taking differently: Take 1 mg by mouth every morning. 03/23/21 04/22/21 Yes Thayer Headings, [place]   ?Cholecalciferol (VITAMIN D3 PO) Take 1 tablet by mouth every morning.   Yes [provider]  ?Cyanocobalamin (VITAMIN B-12 PO) Take 1 tablet by mouth every morning.   Yes [provider]  ?diphenhydramine-acetaminophen (TYLENOL PM) 25-500 MG TABS tablet Take 2 tablets by mouth See admin instructions. Take 2 tablets by mouth every other night at bedtime (alternate with Advil PM)   Yes [provider]  ?escitalopram (LEXAPRO) 20 MG tablet Take 1 tablet (20 mg total) by mouth daily. ?Patient taking differently: Take 20 mg by mouth every morning. 02/13/21  Yes Thayer Headings, PMHNP  ?esomeprazole (NEXIUM) 20 MG capsule Take 20 mg by mouth every morning.   Yes [provider]  ?hydrochlorothiazide (MICROZIDE) 12.5 MG capsule Take 6.25 mg by mouth in the morning.   Yes [provider]  ?Ibuprofen-diphenhydrAMINE Cit (ADVIL PM PO) Take 2 tablets by mouth See admin instructions. Take 2 tablets by mouth every other night at bedtime (alternate with Tylenol PM)   Yes [provider]  ?metoprolol tartrate (LOPRESSOR) 25 MG tablet Take 25 mg by mouth 2 (two) times daily. 04/16/13  Yes [provider]  ?OVER THE COUNTER MEDICATION Take 1 tablet by mouth at bedtime. Delta 9 - Hemp gummies   Yes [provider]  ?REPATHA SURECLICK 767 MG/ML SOAJ INJECT 1 PEN INTO THE SKIN EVERY 14 DAYS ?Patient taking differently: Inject 140 mg into the skin every 14 (fourteen) days. Every other Monday 03/13/21  Yes Burnell Blanks, MD  ?  rizatriptan (MAXALT) 10 MG tablet Take 10 mg by mouth See admin instructions. Take one tablet (10 mg) by mouth at onset of migraine headache, may repeat in 2 hours if still needed.   Yes [provider]  ?zaleplon (SONATA) 10 MG capsule Take 1 capsule (10 mg total) by mouth at bedtime as needed for sleep. ?Patient taking differently: Take 10 mg by mouth at bedtime. 03/07/21  Yes Thayer Headings, PMHNP  ?   ? ?Allergies    ?Ace  inhibitors, Ambien [zolpidem], Amoxicillin, Codeine, Cortisone, Felodipine, Hydrocodone-acetaminophen, Lisinopril, Losartan potassium, Nexletol [bempedoic acid], Venlafaxine, Zetia [ezetimibe], Crestor [rosuvastatin], Lamictal [lamotrigine], and Statins   ? ?Review of Systems   ?Review of Systems  ?Neurological:  Positive for syncope and weakness.  ?All other systems reviewed and are negative. ? ?Physical Exam ?Updated Vital Signs ?BP 139/86 (BP Location: Left Arm)   Pulse (!) 123   Temp 98.1 ?F (36.7 ?C) (Oral)   Resp 20   Ht '5\' 2"'$  (1.575 m)   Wt 68.5 kg   LMP 03/23/2011   SpO2 98%   BMI 27.64 kg/m?  ?Physical Exam ?Vitals and nursing note reviewed.  ?Constitutional:   ?   General: She is not in acute distress. ?   Appearance: She is well-developed.  ?HENT:  ?   Head: Normocephalic and atraumatic.  ?   Mouth/Throat:  ?   Mouth: Mucous membranes are dry.  ?Eyes:  ?   Conjunctiva/sclera: Conjunctivae normal.  ?Cardiovascular:  ?   Rate and Rhythm: Regular rhythm. Tachycardia present.  ?   Pulses: Normal pulses.  ?Pulmonary:  ?   Effort: Pulmonary effort is normal. No respiratory distress.  ?   Breath sounds: Normal breath sounds.  ?Abdominal:  ?   Palpations: Abdomen is soft.  ?   Tenderness: There is no abdominal tenderness.  ?Musculoskeletal:     ?   General: No swelling.  ?   Cervical back: Neck supple.  ?Skin: ?   General: Skin is warm and dry.  ?   Capillary Refill: Capillary refill takes less than 2 seconds.  ?Neurological:  ?   Mental Status: She is alert.  ?   Comments: MENTAL STATUS EXAM:    ?Orientation: Alert and oriented to person, place and time.  ?Memory: Cooperative, follows commands well.  ?Language: Speech is clear and language is normal.  ? ?CRANIAL NERVES:    ?CN 2 (Optic): Visual fields intact to confrontation.  ?CN 3,4,6 (EOM): Pupils equal and reactive to light. Full extraocular eye movement without nystagmus.  ?CN 5 (Trigeminal): Facial sensation is normal, no weakness of masticatory  muscles.  ?CN 7 (Facial): No facial weakness or asymmetry.  ?CN 8 (Auditory): Auditory acuity grossly normal.  ?CN 9,10 (Glossophar): The uvula is midline, the palate elevates symmetrically.  ?CN 11 (spinal access): Normal sternocleidomastoid and trapezius strength.  ?CN 12 (Hypoglossal): The tongue is midline. No atrophy or fasciculations..  ? ?MOTOR:  Muscle Strength: 5/5RUE, 5/5LUE, 5/5RLE, 5/5LLE.  ? ?SENSATION:   Intact to light touch all four extremities. ? ? ?  ?Psychiatric:     ?   Mood and Affect: Mood normal.  ? ? ?ED Results / Procedures / Treatments   ?Labs ?(all labs ordered are listed, but only abnormal results are displayed) ?Labs Reviewed  ?COMPREHENSIVE METABOLIC PANEL - Abnormal; Notable for the following components:  ?    Result Value  ? Sodium 132 (*)   ? Potassium 3.4 (*)   ? Chloride 97 (*)   ?  Glucose, Bld 139 (*)   ? BUN 39 (*)   ? Creatinine, Ser 1.21 (*)   ? GFR, Estimated 51 (*)   ? All other components within normal limits  ?D-DIMER, QUANTITATIVE - Abnormal; Notable for the following components:  ? D-Dimer, Quant 0.86 (*)   ? All other components within normal limits  ?CBC WITH DIFFERENTIAL/PLATELET  ?MAGNESIUM  ?LACTIC ACID, PLASMA  ?URINALYSIS, ROUTINE W REFLEX MICROSCOPIC  ?HIV ANTIBODY (ROUTINE TESTING W REFLEX)  ?TSH  ?TROPONIN I (HIGH SENSITIVITY)  ? ? ?EKG ?EKG Interpretation ? ?Date/Time:  Monday April 16 2021 09:31:26 EDT ?Ventricular Rate:  111 ?PR Interval:  180 ?QRS Duration: 86 ?QT Interval:  297 ?QTC Calculation: 404 ?R Axis:   40 ?Text Interpretation: Sinus tachycardia Low voltage, precordial leads Borderline repolarization abnormality No previous ECGs available Confirmed by Regan Lemming [phone]) on 04/16/2021 12:29:41 PM ? ?Radiology ?DG Chest Portable 1 View ? ?Result Date: 04/16/2021 ?CLINICAL DATA:  Syncope EXAM: PORTABLE CHEST 1 VIEW COMPARISON:  11/26/2010 FINDINGS: The heart size and mediastinal contours are within normal limits. Both lungs are clear. The visualized  skeletal structures are unremarkable. IMPRESSION: No active disease. Electronically Signed   By: Elmer Picker M.D.   On: 04/16/2021 10:23   ? ?Procedures ?Procedures  ? ? ?Medications Ordered in ED ?Medications  ?potassium c

## 2021-04-17 ENCOUNTER — Observation Stay (HOSPITAL_COMMUNITY): Payer: BC Managed Care – PPO

## 2021-04-17 ENCOUNTER — Observation Stay (HOSPITAL_BASED_OUTPATIENT_CLINIC_OR_DEPARTMENT_OTHER): Payer: BC Managed Care – PPO

## 2021-04-17 DIAGNOSIS — E860 Dehydration: Secondary | ICD-10-CM | POA: Diagnosis not present

## 2021-04-17 DIAGNOSIS — I951 Orthostatic hypotension: Secondary | ICD-10-CM

## 2021-04-17 DIAGNOSIS — F411 Generalized anxiety disorder: Secondary | ICD-10-CM

## 2021-04-17 DIAGNOSIS — I10 Essential (primary) hypertension: Secondary | ICD-10-CM | POA: Diagnosis not present

## 2021-04-17 DIAGNOSIS — E876 Hypokalemia: Secondary | ICD-10-CM

## 2021-04-17 DIAGNOSIS — R55 Syncope and collapse: Secondary | ICD-10-CM | POA: Diagnosis not present

## 2021-04-17 DIAGNOSIS — N179 Acute kidney failure, unspecified: Secondary | ICD-10-CM | POA: Diagnosis present

## 2021-04-17 DIAGNOSIS — E871 Hypo-osmolality and hyponatremia: Secondary | ICD-10-CM

## 2021-04-17 LAB — CBC
HCT: 34.9 % — ABNORMAL LOW (ref 36.0–46.0)
Hemoglobin: 11.9 g/dL — ABNORMAL LOW (ref 12.0–15.0)
MCH: 30.4 pg (ref 26.0–34.0)
MCHC: 34.1 g/dL (ref 30.0–36.0)
MCV: 89.3 fL (ref 80.0–100.0)
Platelets: 276 10*3/uL (ref 150–400)
RBC: 3.91 MIL/uL (ref 3.87–5.11)
RDW: 12.6 % (ref 11.5–15.5)
WBC: 8.8 10*3/uL (ref 4.0–10.5)
nRBC: 0 % (ref 0.0–0.2)

## 2021-04-17 LAB — ECHOCARDIOGRAM COMPLETE
AR max vel: 1.53 cm2
AV Area VTI: 1.57 cm2
AV Area mean vel: 1.56 cm2
AV Mean grad: 5 mmHg
AV Peak grad: 9.5 mmHg
Ao pk vel: 1.54 m/s
Area-P 1/2: 4.57 cm2
Height: 62 in
S' Lateral: 2.6 cm
Weight: 2370.39 oz

## 2021-04-17 LAB — COMPREHENSIVE METABOLIC PANEL
ALT: 23 U/L (ref 0–44)
AST: 17 U/L (ref 15–41)
Albumin: 3.8 g/dL (ref 3.5–5.0)
Alkaline Phosphatase: 52 U/L (ref 38–126)
Anion gap: 7 (ref 5–15)
BUN: 16 mg/dL (ref 6–20)
CO2: 23 mmol/L (ref 22–32)
Calcium: 8.8 mg/dL — ABNORMAL LOW (ref 8.9–10.3)
Chloride: 107 mmol/L (ref 98–111)
Creatinine, Ser: 0.83 mg/dL (ref 0.44–1.00)
GFR, Estimated: >60 mL/min (ref >60)
Glucose, Bld: 112 mg/dL — ABNORMAL HIGH (ref 70–99)
Potassium: 3.3 mmol/L — ABNORMAL LOW (ref 3.5–5.1)
Sodium: 137 mmol/L (ref 135–145)
Total Bilirubin: 0.5 mg/dL (ref 0.3–1.2)
Total Protein: 6.7 g/dL (ref 6.5–8.1)

## 2021-04-17 LAB — GLUCOSE, CAPILLARY
Glucose-Capillary: 96 mg/dL (ref 70–99)
Glucose-Capillary: 120 mg/dL — ABNORMAL HIGH (ref 70–99)

## 2021-04-17 LAB — HIV ANTIBODY (ROUTINE TESTING W REFLEX): HIV Screen 4th Generation wRfx: NONREACTIVE

## 2021-04-17 MED ORDER — LOPERAMIDE HCL 2 MG PO CAPS
2.0000 mg | ORAL_CAPSULE | Freq: Once | ORAL | Status: AC
  Administered 2021-04-17: 2 mg via ORAL
  Filled 2021-04-17: qty 1

## 2021-04-17 MED ORDER — HYDRALAZINE HCL 25 MG PO TABS
25.0000 mg | ORAL_TABLET | Freq: Three times a day (TID) | ORAL | Status: DC
Start: 2021-04-17 — End: 2021-04-18
  Administered 2021-04-17 – 2021-04-18 (×3): 25 mg via ORAL
  Filled 2021-04-17 (×3): qty 1

## 2021-04-17 MED ORDER — POTASSIUM CHLORIDE CRYS ER 20 MEQ PO TBCR: 50.0000 meq | EXTENDED_RELEASE_TABLET | Freq: Once | ORAL | Status: DC

## 2021-04-17 MED ORDER — POTASSIUM CHLORIDE CRYS ER 10 MEQ PO TBCR
50.0000 meq | EXTENDED_RELEASE_TABLET | Freq: Once | ORAL | Status: AC
  Administered 2021-04-17: 50 meq via ORAL
  Filled 2021-04-17: qty 1

## 2021-04-17 NOTE — Progress Notes (Signed)
Echocardiogram ?2D Echocardiogram has been performed. ? ?Arlyss Gandy ?04/17/2021, 12:51 PM ?

## 2021-04-17 NOTE — Progress Notes (Signed)
?PROGRESS NOTE ? ? ? ?Amanda [name]  XTG:[id number] DOB: 03-21-1960 DOA: 04/16/2021 ?PCP: Antony Contras, MD  ? ? ? ?Brief Narrative:  ? ?61 y.o. WF PMHx HTN, Anxiety, Depression, Asthma, headaches, insomnia.  CKD stage III ? ?Presenting after a syncopal episode. History is from family at bedside. The patient was in her normal state of health until last night. Before going to bed, she took a hemp edible and 2 APAP PM. She felt a little lightheaded, but dismissed it. This morning she woke and went to the bathroom. When she tried to stand up from the toilet, she had a witnessed syncopal event. She fell into her family's arms. She did not hit her head. She was "out of it" for a few minutes. There was not tonic-clonic jerks, foaming at the mouth, or incontinence. EMS was alerted. The family reports that she has started the "[place]" and recently added a new anti-anxiety medication to her regimen. Otherwise, they deny and other aggravating or alleviating factors. ? ?Subjective: ?3/21 A/O x4, negative CP, negative nausea, vomiting.  Negative feeling of lightheadedness. ? ? ?Assessment & Plan: ?Covid vaccination; ?  ?Principal Problem: ?  Syncope ?Active Problems: ?  Anxiety state ?  Essential hypertension ?  Hypokalemia ?  Hyponatremia ?  GERD (gastroesophageal reflux disease) ?  AKI (acute kidney injury) ([place]) ? ? ? Syncope ?-Most likely multifactorial to include recent dieting, dehydration, AKI, and electrolyte abnormalities. ?-EKG sinus tachycardia ?- CXR: Negative ?-3/20 TSH= 1.2 WNL ?-D-dimer 0.86 mildly elevated. ?-CXR negative ?-3/21 orthostatic vitals q shift ?-3/21 Echocardiogram, reading pending     ? ?HTN ?-3/21 Hydralazine 25 mg TID ?-Metoprolol 25 mg BID ?-Would not discharge patient on HCTZ as it can cause hyponatremia and known to be nephrotoxic. ? ?AKI ?Lab Results  ?Component Value Date  ? CREATININE 0.83 04/17/2021  ? CREATININE 1.21 (H) 04/16/2021  ? CREATININE 0.94 03/24/2020  ? CREATININE 0.94  03/23/2020  ? CREATININE 1.05 (H) 03/22/2020  ?-Renal US pending ?-Avoid nephrotoxins ?-Normal saline 112m/hr ?-Strict in and out ?- Daily weight ?-Resolved ?  ?Anxiety/Depression ?-Lexapro on hold known to cause hyponatremia.  Would not discharge patient on this medication.  Would leave up to PCP to find better medication. ?-Rexulti 1 mg daily ?-Xanax 1 mg qhs ? ?GERD ?    - PPI ? ?Hypokalemia ?- Potassium goal> 4 ?- 3/21 K-Dur 50 mEq ? ?Hyponatremia ?- Most likely secondary to SSRI, and new diet. ?-3/21 resolved ? ?  ? ? ?Mobility Assessment (last 72 hours)   ? ? Mobility Assessment   ? ? RBeltonName 04/16/21 1700  ?  ?  ?  ?  ? Does patient have an order for bedrest or is patient medically unstable No - Continue assessment      ? What is the highest level of mobility based on the progressive mobility assessment? Level 5 (Walks with assist in room/hall) - Balance while stepping forward/back and can walk in room with assist - Complete      ? ?  ?  ? ?  ? ? ?DVT prophylaxis: Lovenox ?Code Status: Full ?Family Communication: 3/21 wife at bedside for discussion of plan of care all questions answered ?Status is: Inpatient ? ? ? ?Dispo: The patient is from: Home ?             Anticipated d/c is to: Home ?             Anticipated d/c date is: 1 day ?  Patient currently is not medically stable to d/c. ? ? ? ? ? ?Consultants:  ? ? ?Procedures/Significant Events:  ?3/21 Echocardiogram: Reading pending ?3/21 renal US pending ? ? ? ?I have personally reviewed and interpreted all radiology studies and my findings are as above. ? ?VENTILATOR SETTINGS: ? ? ? ?Cultures ? ? ?Antimicrobials: ? ? ? ?Devices ? ? ?LINES / TUBES:  ? ? ? ? ?Continuous Infusions: ? sodium chloride 100 mL/hr at 04/17/21 0406  ? ? ? ?Objective: ?Vitals:  ? 04/16/21 2009 04/17/21 0141 04/17/21 0500 04/17/21 0506  ?BP: (!) 154/93 (!) 150/98  (!) 163/88  ?Pulse: (!) 107 78  82  ?Resp: '14 14  16  '$ ?Temp: 98.2 ?F (36.8 ?C) 98.1 ?F (36.7 ?C)  98.1 ?F  (36.7 ?C)  ?TempSrc: Oral Oral  Oral  ?SpO2: 98% 99%  98%  ?Weight:   67.2 kg   ?Height:      ? ? ?Intake/Output Summary (Last 24 hours) at 04/17/2021 1116 ?Last data filed at 04/17/2021 0500 ?Gross per 24 hour  ?Intake 1140 ml  ?Output 1150 ml  ?Net -10 ml  ? ?Filed Weights  ? 04/16/21 0931 04/16/21 1427 04/17/21 0500  ?Weight: 68 kg 68.5 kg 67.2 kg  ? ? ?Examination: ? ?General: A/O x4, No acute respiratory distress ?Eyes: negative scleral hemorrhage, negative anisocoria, negative icterus ?ENT: Negative Runny nose, negative gingival bleeding, ?Neck:  Negative scars, masses, torticollis, lymphadenopathy, JVD ?Lungs: Clear to auscultation bilaterally without wheezes or crackles ?Cardiovascular: Regular rate and rhythm without murmur gallop or rub normal S1 and S2 ?Abdomen: negative abdominal pain, nondistended, positive soft, bowel sounds, no rebound, no ascites, no appreciable mass ?Extremities: No significant cyanosis, clubbing, or edema bilateral lower extremities ?Skin: Negative rashes, lesions, ulcers ?Psychiatric:  Negative depression, negative anxiety, negative fatigue, negative mania  ?Central nervous system:  Cranial nerves II through XII intact, tongue/uvula midline, all extremities muscle strength 5/5, sensation intact throughout, negative dysarthria, negative expressive aphasia, negative receptive aphasia. ? ?.  ? ? ? ?Data Reviewed: Care during the described time interval was provided by me .  I have reviewed this patient's available data, including medical history, events of note, physical examination, and all test results as part of my evaluation. ? ?CBC: ?Recent Labs  ?Lab 04/16/21 ?1023 04/17/21 ?0352  ?WBC 6.5 8.8  ?NEUTROABS 4.0  --   ?HGB 13.5 11.9*  ?HCT 39.2 34.9*  ?MCV 88.1 89.3  ?PLT 322 276  ? ?Basic Metabolic Panel: ?Recent Labs  ?Lab 04/16/21 ?1023 04/17/21 ?0352  ?NA 132* 137  ?K 3.4* 3.3*  ?CL 97* 107  ?CO2 24 23  ?GLUCOSE 139* 112*  ?BUN 39* 16  ?CREATININE 1.21* 0.83  ?CALCIUM 9.6 8.8*   ?MG 2.2  --   ? ?GFR: ?Estimated Creatinine Clearance: 64.7 mL/min (by C-G formula based on SCr of 0.83 mg/dL). ?Liver Function Tests: ?Recent Labs  ?Lab 04/16/21 ?1023 04/17/21 ?0352  ?AST 24 17  ?ALT 29 23  ?ALKPHOS 60 52  ?BILITOT 0.3 0.5  ?PROT 7.5 6.7  ?ALBUMIN 4.3 3.8  ? ?No results for input(s): LIPASE, AMYLASE in the last 168 hours. ?No results for input(s): AMMONIA in the last 168 hours. ?Coagulation Profile: ?No results for input(s): INR, PROTIME in the last 168 hours. ?Cardiac Enzymes: ?No results for input(s): CKTOTAL, CKMB, CKMBINDEX, TROPONINI in the last 168 hours. ?BNP (last 3 results) ?No results for input(s): PROBNP in the last 8760 hours. ?HbA1C: ?No results for input(s): HGBA1C in the last 72 hours. ?  CBG: ?Recent Labs  ?Lab 04/17/21 ?2297 04/17/21 ?0731  ?GLUCAP 96 120*  ? ?Lipid Profile: ?No results for input(s): CHOL, HDL, LDLCALC, TRIG, CHOLHDL, LDLDIRECT in the last 72 hours. ?Thyroid Function Tests: ?Recent Labs  ?  04/16/21 ?1444  ?TSH 1.274  ? ?Anemia Panel: ?No results for input(s): VITAMINB12, FOLATE, FERRITIN, TIBC, IRON, RETICCTPCT in the last 72 hours. ?Sepsis Labs: ?Recent Labs  ?Lab 04/16/21 ?1155  ?LATICACIDVEN 1.8  ? ? ?No results found for this or any previous visit (from the past 240 hour(s)).  ? ? ? ? ? ?Radiology Studies: ?DG Chest Portable 1 View ? ?Result Date: 04/16/2021 ?CLINICAL DATA:  Syncope EXAM: PORTABLE CHEST 1 VIEW COMPARISON:  11/26/2010 FINDINGS: The heart size and mediastinal contours are within normal limits. Both lungs are clear. The visualized skeletal structures are unremarkable. IMPRESSION: No active disease. Electronically Signed   By: Elmer Picker M.D.   On: 04/16/2021 10:23   ? ? ? ? ? ?Scheduled Meds: ? ALPRAZolam  1 mg Oral QHS  ? brexpiprazole  1 mg Oral q morning  ? enoxaparin (LOVENOX) injection  40 mg Subcutaneous Q24H  ? metoprolol tartrate  25 mg Oral BID  ? pantoprazole  40 mg Oral Daily  ? potassium chloride  40 mEq Oral Daily  ? sodium  chloride flush  3 mL Intravenous Q12H  ? ?Continuous Infusions: ? sodium chloride 100 mL/hr at 04/17/21 0406  ? ? ? LOS: 0 days  ? ? ?Time spent:40 min ? ? ? ?Martise Waddell, Geraldo Docker, MD ?[facility] ? ? ?If 7P

## 2021-04-18 DIAGNOSIS — R55 Syncope and collapse: Secondary | ICD-10-CM | POA: Diagnosis not present

## 2021-04-18 LAB — CBC WITH DIFFERENTIAL/PLATELET
Abs Immature Granulocytes: 0.01 10*3/uL (ref 0.00–0.07)
Basophils Absolute: 0.1 10*3/uL (ref 0.0–0.1)
Basophils Relative: 1 %
Eosinophils Absolute: 0.1 10*3/uL (ref 0.0–0.5)
Eosinophils Relative: 2 %
HCT: 36.1 % (ref 36.0–46.0)
Hemoglobin: 12.2 g/dL (ref 12.0–15.0)
Immature Granulocytes: 0 %
Lymphocytes Relative: 43 %
Lymphs Abs: 3.3 10*3/uL (ref 0.7–4.0)
MCH: 30.7 pg (ref 26.0–34.0)
MCHC: 33.8 g/dL (ref 30.0–36.0)
MCV: 90.9 fL (ref 80.0–100.0)
Monocytes Absolute: 0.7 10*3/uL (ref 0.1–1.0)
Monocytes Relative: 9 %
Neutro Abs: 3.4 10*3/uL (ref 1.7–7.7)
Neutrophils Relative %: 45 %
Platelets: 275 10*3/uL (ref 150–400)
RBC: 3.97 MIL/uL (ref 3.87–5.11)
RDW: 12.7 % (ref 11.5–15.5)
WBC: 7.5 10*3/uL (ref 4.0–10.5)
nRBC: 0 % (ref 0.0–0.2)

## 2021-04-18 LAB — COMPREHENSIVE METABOLIC PANEL
ALT: 23 U/L (ref 0–44)
AST: 20 U/L (ref 15–41)
Albumin: 3.7 g/dL (ref 3.5–5.0)
Alkaline Phosphatase: 53 U/L (ref 38–126)
Anion gap: 5 (ref 5–15)
BUN: 9 mg/dL (ref 6–20)
CO2: 23 mmol/L (ref 22–32)
Calcium: 9 mg/dL (ref 8.9–10.3)
Chloride: 109 mmol/L (ref 98–111)
Creatinine, Ser: 0.79 mg/dL (ref 0.44–1.00)
GFR, Estimated: >60 mL/min (ref >60)
Glucose, Bld: 103 mg/dL — ABNORMAL HIGH (ref 70–99)
Potassium: 4.1 mmol/L (ref 3.5–5.1)
Sodium: 137 mmol/L (ref 135–145)
Total Bilirubin: 0.4 mg/dL (ref 0.3–1.2)
Total Protein: 6.7 g/dL (ref 6.5–8.1)

## 2021-04-18 LAB — GLUCOSE, CAPILLARY: Glucose-Capillary: 99 mg/dL (ref 70–99)

## 2021-04-18 LAB — MAGNESIUM: Magnesium: 2 mg/dL (ref 1.7–2.4)

## 2021-04-18 LAB — PHOSPHORUS: Phosphorus: 2.7 mg/dL (ref 2.5–4.6)

## 2021-04-18 MED ORDER — HYDRALAZINE HCL 50 MG PO TABS
50.0000 mg | ORAL_TABLET | Freq: Three times a day (TID) | ORAL | Status: DC
  Administered 2021-04-18: 50 mg via ORAL
  Filled 2021-04-18: qty 1

## 2021-04-18 MED ORDER — ACETAMINOPHEN 325 MG PO TABS
650.0000 mg | ORAL_TABLET | Freq: Once | ORAL | Status: AC
  Administered 2021-04-18: 650 mg via ORAL
  Filled 2021-04-18: qty 2

## 2021-04-18 MED ORDER — HYDRALAZINE HCL 50 MG PO TABS: 50.0000 mg | ORAL_TABLET | Freq: Three times a day (TID) | ORAL | 1 refills | Status: DC

## 2021-04-18 NOTE — Progress Notes (Signed)
Patient has been read and explained discharge instructions. Patient has no further questions at this time. Right arm IV has been removed. Site is clean, dry and intact. ? ?Layla Maw, RN ?

## 2021-04-18 NOTE — Discharge Summary (Signed)
Physician Discharge Summary  ?Amanda [name] OIB:[id number] DOB: [birth year] DOA: 04/16/2021 ? ?PCP: Antony Contras, MD ? ?Admit date: 04/16/2021 ?Discharge date: 04/18/2021 ? ?Admitted From: Home  ?Disposition:  Home  ? ?Recommendations for Outpatient Follow-up:  ?Follow up with PCP in 1-2 weeks ?Please obtain BMP/CBC in one week ?Needs Bmet to follow sodium level, after resumption of Lexapro.  ?Monitor BP and adjust medications as needed. Avoid HCTZ due to hyponatremia.  ? ? ?Home Health: none ? ?Discharge Condition: Stable.  ?CODE STATUS:Full code ?Diet recommendation: Heart Healthy  ? ?Brief/Interim Summary:  ?61 year old with past medical history significant for hypertension, anxiety, depression, asthma, headache, insomnia, CKD stage II who presented after a syncopal episode.  Patient took hemp edible 1 to Tylenol PM the night prior to admission.  She felt a little lightheaded, but dismisses it.  The morning she wake up went to the bathroom, when she tried to stand up from the toilet she passed out.  She has been doing new diet "[place] "  ? ?She was found to be dehydrated, AKI on CKD, hyponatremia and hypokalemia.  She received IV fluids.  Hydrochlorothiazide discontinued.  Initially Lexapro was held, less likely Lexapro causing hyponatremia.  Will resume at discharge.  Echo normal ejection fraction no aortic stenosis. ? ?1-Syncope: ?Multifactorial related to recent dieting, dehydration, AKI, electrolyte abnormalities medications, she was taking Tylenol PM and hemp.  ?She received IV fluids.  Dehydration resolved.  Echo normal ejection fraction, diastolic dysfunction grade 1. ?Repeated Orthostatic negative. ? ?2-Hypertension: ?Discontinue hydrochlorothiazide due to AKI and hyponatremia ?Continue with metoprolol.  Started hydralazine increased dose to 50 mg 3 times daily. ? ?3-AKI: CKD stage II; ?Presented with a creatinine 1.2, BUN 39. ?Hydrochlorothiazide discontinued.  Received IV fluids.  Renal function  improved ?Ultrasound negative ? ?4-Anxiety/ depression; ?Lexapro was on hold due to hyponatremia, her hyponatremia was very mild, and likely related to hydrochlorothiazide and dehydration.  Will resume her Lexapro.  Continue with Rexulti and Xanax ?She will need to B-met  follow-up at the end of this week. ? ?5-Hypokalemia: Replaced ?GERD: Continue with PPI ?Hyponatremia: Likely secondary to hydrochlorothiazide and dehydration. ?Hydrochlorothiazide discontinued.  Okay to resume his SSRI ?B-met at the end of this week.  ? ? ?Discharge Diagnoses:  ?Principal Problem: ?  Syncope ?Active Problems: ?  Anxiety state ?  Essential hypertension ?  Hypokalemia ?  Hyponatremia ?  GERD (gastroesophageal reflux disease) ?  AKI (acute kidney injury) ([place]) ?  Acute renal failure superimposed on stage 3a chronic kidney disease ([place]) ? ? ? ?Discharge Instructions ? ?Discharge Instructions   ? ? Diet - low sodium heart healthy   Complete by: As directed ?  ? Increase activity slowly   Complete by: As directed ?  ? ?  ? ?Allergies as of 04/18/2021   ? ?   Reactions  ? Ace Inhibitors Cough  ? Ambien [zolpidem] Other (See Comments)  ? Other reaction(s): headache/neck throbbing  ? Amoxicillin Other (See Comments)  ? Gi upset  ? Codeine Nausea And Vomiting  ? Can tolerate hydrocodone if has food on stomach  ? Cortisone Other (See Comments)  ? Extremely high BP- reaction to injection  ? Felodipine Other (See Comments)  ?  increased flushing and made her feel bad  ? Hydrocodone-acetaminophen Nausea And Vomiting  ? Must eat before and after each dose  ? Lisinopril Swelling, Cough  ? Throat swelling  ? Losartan Potassium Cough  ? Nexletol [bempedoic Acid] Other (See Comments)  ?  Brain fog  ? Venlafaxine Other (See Comments)  ? headaches/elevated blood pressure  ? Zetia [ezetimibe] Diarrhea  ? Crestor [rosuvastatin] Rash  ? facial rash  ? Lamictal [lamotrigine] Rash  ? Statins Rash  ? Face and leg rash with rosuvastatin, pravastatin, and  atorvastatin  ? ?  ? ?  ?Medication List  ?  ? ?STOP taking these medications   ? ?ADVIL PM PO ?  ?diphenhydramine-acetaminophen 25-500 MG Tabs tablet ?Commonly known as: TYLENOL PM ?  ?hydrochlorothiazide 12.5 MG capsule ?Commonly known as: MICROZIDE ?  ?OVER THE COUNTER MEDICATION ?  ? ?  ? ?TAKE these medications   ? ?ALPRAZolam 1 MG tablet ?Commonly known as: Duanne Moron ?TAKE 1 TABLET AT BEDTIME AND 1/2-1 TABLET AS NEEDED FOR ANXIETY. ?What changed:  ?how much to take ?how to take this ?when to take this ?additional instructions ?  ?brexpiprazole 1 MG Tabs tablet ?Commonly known as: Rexulti ?Take 1 tablet (1 mg total) by mouth daily. ?What changed: when to take this ?  ?escitalopram 20 MG tablet ?Commonly known as: LEXAPRO ?Take 1 tablet (20 mg total) by mouth daily. ?What changed: when to take this ?  ?esomeprazole 20 MG capsule ?Commonly known as: [place] ?Take 20 mg by mouth every morning. ?  ?hydrALAZINE 50 MG tablet ?Commonly known as: APRESOLINE ?Take 1 tablet (50 mg total) by mouth every 8 (eight) hours. ?  ?metoprolol tartrate 25 MG tablet ?Commonly known as: LOPRESSOR ?Take 25 mg by mouth 2 (two) times daily. ?  ?Repatha SureClick 163 MG/ML Soaj ?Generic drug: Evolocumab ?INJECT 1 PEN INTO THE SKIN EVERY 14 DAYS ?What changed: See the new instructions. ?  ?rizatriptan 10 MG tablet ?Commonly known as: MAXALT ?Take 10 mg by mouth See admin instructions. Take one tablet (10 mg) by mouth at onset of migraine headache, may repeat in 2 hours if still needed. ?  ?VITAMIN B-12 PO ?Take 1 tablet by mouth every morning. ?  ?VITAMIN D3 PO ?Take 1 tablet by mouth every morning. ?  ?zaleplon 10 MG capsule ?Commonly known as: SONATA ?Take 1 capsule (10 mg total) by mouth at bedtime as needed for sleep. ?What changed: when to take this ?  ? ?  ? ? Follow-up Information   ? ? Antony Contras, MD Follow up.   ?Specialty: Family Medicine ?Why: you need lab work at end of week, doium level. ?Contact information: ?[address], [address] ?[place] [address] ?[phone] ? ? ?  ?  ? ? Burnell Blanks, MD .   ?Specialty: Cardiology ?Contact information: ?[address] ?[address] ?[place] [address] ?[phone] ? ? ?  ?  ? ?  ?  ? ?  ? ?Allergies  ?Allergen Reactions  ? Ace Inhibitors Cough  ? Ambien [Zolpidem] Other (See Comments)  ?  Other reaction(s): headache/neck throbbing  ? Amoxicillin Other (See Comments)  ?  Gi upset  ? Codeine Nausea And Vomiting  ?  Can tolerate hydrocodone if has food on stomach ? ?  ? Cortisone Other (See Comments)  ?  Extremely high BP- reaction to injection  ? Felodipine Other (See Comments)  ?   increased flushing and made her feel bad  ? Hydrocodone-Acetaminophen Nausea And Vomiting  ?  Must eat before and after each dose  ? Lisinopril Swelling and Cough  ?  Throat swelling  ? Losartan Potassium Cough  ? Nexletol [Bempedoic Acid] Other (See Comments)  ?  Brain fog  ? Venlafaxine Other (  See Comments)  ?  headaches/elevated blood pressure  ? Zetia [Ezetimibe] Diarrhea  ? Crestor [Rosuvastatin] Rash  ?  facial rash  ? Lamictal [Lamotrigine] Rash  ? Statins Rash  ?  Face and leg rash with rosuvastatin, pravastatin, and atorvastatin  ? ? ?Consultations: ?None ? ? ?Procedures/Studies: ?US RENAL ? ?Result Date: 04/17/2021 ?CLINICAL DATA:  Acute kidney injury EXAM: RENAL / URINARY TRACT ULTRASOUND COMPLETE COMPARISON:  03/22/2020 CT with contrast FINDINGS: Right Kidney: Renal measurements: 8.2 x 3.8 x 5.1 cm = volume: 83 mL. Echogenicity within normal limits. No mass or hydronephrosis visualized. Left Kidney: Renal measurements: 8.7 x 5.1 x 4.5 cm = volume: 104 mL. Echogenicity within normal limits. No mass or hydronephrosis visualized. Bladder: Appears normal for degree of bladder distention. Other: None. IMPRESSION: Normal renal ultrasound for age Electronically Signed   By: Jerilynn Mages.  Shick M.D.   On: 04/17/2021 16:17  ? ?DG Chest Portable 1 View ? ?Result Date: 04/16/2021 ?CLINICAL DATA:  Syncope  EXAM: PORTABLE CHEST 1 VIEW COMPARISON:  11/26/2010 FINDINGS: The heart size and mediastinal contours are within normal limits. Both lungs are clear. The visualized skeletal structures are unremarkable. IMPRESSION:

## 2021-04-20 ENCOUNTER — Telehealth: Payer: Self-pay | Admitting: Cardiovascular Disease

## 2021-04-20 DIAGNOSIS — G43909 Migraine, unspecified, not intractable, without status migrainosus: Secondary | ICD-10-CM | POA: Diagnosis not present

## 2021-04-20 DIAGNOSIS — N1832 Chronic kidney disease, stage 3b: Secondary | ICD-10-CM | POA: Diagnosis not present

## 2021-04-20 DIAGNOSIS — F419 Anxiety disorder, unspecified: Secondary | ICD-10-CM | POA: Diagnosis not present

## 2021-04-20 DIAGNOSIS — I129 Hypertensive chronic kidney disease with stage 1 through stage 4 chronic kidney disease, or unspecified chronic kidney disease: Secondary | ICD-10-CM | POA: Diagnosis not present

## 2021-04-20 NOTE — Telephone Encounter (Signed)
Pt c/o BP issue: STAT if pt c/o blurred vision, one-sided weakness or slurred speech ? ?1. What are your last 5 BP readings? 170/85, 160/88 ? ?2. Are you having any other symptoms (ex. Dizziness, headache, blurred vision, passed out)? Weak ? ? ?3. What is your BP issue? Patient went to PCP and her BP was still elevated. Her PCP advised her to see Dr. Sherrye Payor. ? ?Patient did have syncope spell Monday and was transported to the hospital via EMS. She was started on a new medication during her hospital stay. She was not sure how long it would take for that new medication to get into her system and start working  ?

## 2021-04-20 NOTE — Telephone Encounter (Signed)
Left message for patient to call back  

## 2021-04-25 NOTE — Telephone Encounter (Signed)
The patient was seen by PCP on 3/24, who recommended she follow up with Dr. Angelena Form for her blood pressure management. She said at the ER they stopped her hctz and started hydralazine 50 mg TID and she feels it is not working.  Her readings are 150s-160s/80s. ? ? ?I have scheduled her with Dr. Angelena Form on 04/27/21.  She is grateful for assistance.  Will bring list of BP readings w her. ?

## 2021-04-26 NOTE — Progress Notes (Signed)
? ?Chief Complaint  ?Patient presents with  ? Follow-up  ?  HTN  ?  ?History of Present Illness: 61 yo female with history of HLD, HTN and palpitations (PACs/PVCs) here today for follow up. I saw her as a new patient for the evaluation of her hyperlipidemia and palpitations in June 2019. She has been tried on Lipitor and Crestor and developed a rash on her face and legs. Palpitations at night. She feels her heart racing when in bed. No chest pain or dyspnea, LE edema or syncope. Also reported fatigue. I ordered an echo but she did not come in for the study. 48 hour cardiac monitor with rare PACs and PVCs. She was referred to the lipid clinic and started on Zetia and continued on Lovaza. She did not tolerate Zetia due to diarrhea. She was tried on Pravastatin but did not tolerate. She is now on Repatha and tolerating. Mild sleep apnea but she says she has not needed a device. She was admitted to [facility] 3/20-3/22/23 following a syncopal episode and was felt to be dehydrated with mild worsening of renal function. She had been dieting. Her HCTZ was stopped. She was started on hydralazine for BP control and continued on metoprolol. Echo 04/17/21 with LVEF=60-65%, mild mitral regurgitation.  ? ?She is here today for follow up. The patient denies any chest pain, dyspnea, palpitations, lower extremity edema, orthopnea, PND. She is feeling well overall.  ? ?Primary Care Physician: Antony Contras, MD ? ?Past Medical History:  ?Diagnosis Date  ? Anxiety   ? Asthma   ? Chronic kidney disease   ? Chronic kidney disease, stage 3 ([place]) 08/10/2019  ? Depression   ? Hyperlipidemia   ? Hypertension   ? Migraine   ? ? ?Past Surgical History:  ?Procedure Laterality Date  ? APPENDECTOMY    ? CARPAL TUNNEL RELEASE    ? rt  ? FOOT SURGERY Bilateral   ? Plantar fasciitis  ? OOPHORECTOMY    ? ? ?Current Outpatient Medications  ?Medication Sig Dispense Refill  ? ALPRAZolam (XANAX) 1 MG tablet TAKE 1 TABLET AT BEDTIME AND 1/2-1  TABLET AS NEEDED FOR ANXIETY. (Patient taking differently: Take 1 mg by mouth at bedtime.) 45 tablet 5  ? Cholecalciferol (VITAMIN D3 PO) Take 1 tablet by mouth every morning.    ? Cyanocobalamin (VITAMIN B-12 PO) Take 1 tablet by mouth every morning.    ? escitalopram (LEXAPRO) 20 MG tablet Take 1 tablet (20 mg total) by mouth daily. (Patient taking differently: Take 20 mg by mouth every morning.) 90 tablet 1  ? esomeprazole (NEXIUM) 20 MG capsule Take 20 mg by mouth every morning.    ? hydrALAZINE (APRESOLINE) 50 MG tablet Take 1 tablet (50 mg total) by mouth every 8 (eight) hours. 90 tablet 1  ? metoprolol tartrate (LOPRESSOR) 50 MG tablet Take 1 tablet (50 mg total) by mouth 2 (two) times daily. 180 tablet 3  ? REPATHA SURECLICK 093 MG/ML SOAJ INJECT 1 PEN INTO THE SKIN EVERY 14 DAYS (Patient taking differently: Inject 140 mg into the skin every 14 (fourteen) days. Every other Monday) 2 mL 11  ? rizatriptan (MAXALT) 10 MG tablet Take 10 mg by mouth See admin instructions. Take one tablet (10 mg) by mouth at onset of migraine headache, may repeat in 2 hours if still needed.    ? zaleplon (SONATA) 10 MG capsule Take 1 capsule (10 mg total) by mouth at bedtime as needed for sleep. (Patient taking differently:  Take 10 mg by mouth at bedtime.) 30 capsule 1  ? brexpiprazole (REXULTI) 1 MG TABS tablet Take 1 tablet (1 mg total) by mouth daily. (Patient taking differently: Take 1 mg by mouth every morning.) 30 tablet 0  ? ?No current facility-administered medications for this visit.  ? ? ?Allergies  ?Allergen Reactions  ? Ace Inhibitors Cough  ? Ambien [Zolpidem] Other (See Comments)  ?  Other reaction(s): headache/neck throbbing  ? Amoxicillin Other (See Comments)  ?  Gi upset  ? Codeine Nausea And Vomiting  ?  Can tolerate hydrocodone if has food on stomach ? ?  ? Cortisone Other (See Comments)  ?  Extremely high BP- reaction to injection  ? Felodipine Other (See Comments)  ?   increased flushing and made her feel bad   ? Hydrocodone-Acetaminophen Nausea And Vomiting  ?  Must eat before and after each dose  ? Lisinopril Swelling and Cough  ?  Throat swelling  ? Losartan Potassium Cough  ? Nexletol [Bempedoic Acid] Other (See Comments)  ?  Brain fog  ? Venlafaxine Other (See Comments)  ?  headaches/elevated blood pressure  ? Zetia [Ezetimibe] Diarrhea  ? Crestor [Rosuvastatin] Rash  ?  facial rash  ? Lamictal [Lamotrigine] Rash  ? Statins Rash  ?  Face and leg rash with rosuvastatin, pravastatin, and atorvastatin  ? ? ?Social History  ? ?Socioeconomic History  ? Marital status: Married  ?  Spouse name: Not on file  ? Number of children: 1  ? Years of education: Not on file  ? Highest education level: Not on file  ?Occupational History  ? Occupation: Optometrist  ?  Employer: [employer]  ?Tobacco Use  ? Smoking status: Former  ?  Packs/day: 0.50  ?  Years: 5.00  ?  Pack years: 2.50  ?  Types: Cigarettes  ?  Quit date: 01/29/1987  ?  Years since quitting: 34.2  ? Smokeless tobacco: Never  ?Substance and Sexual Activity  ? Alcohol use: Yes  ?  Comment: social ETOH only  ? Drug use: No  ? Sexual activity: Not on file  ?Other Topics Concern  ? Not on file  ?Social History Narrative  ? Not on file  ? ?Social Determinants of Health  ? ?Financial Resource Strain: Not on file  ?Food Insecurity: Not on file  ?Transportation Needs: Not on file  ?Physical Activity: Not on file  ?Stress: Not on file  ?Social Connections: Not on file  ?Intimate Partner Violence: Not on file  ? ? ?Family History  ?Problem Relation Age of Onset  ? Elevated Lipids Mother   ? Asthma Father   ? Heart disease Father   ?     CAD s/p CABG  ? Elevated Lipids Father   ? Depression Father   ? Asthma Sister   ? Elevated Lipids Sister   ? Hypertension Sister   ? Depression Sister   ? Elevated Lipids Sister   ? Hypertension Sister   ? Bipolar disorder Sister   ? Elevated Lipids Sister   ? Hypertension Sister   ? Depression Sister   ? Asthma Paternal Aunt   ? Breast cancer  Neg Hx   ? ? ?Review of Systems:  As stated in the HPI and otherwise negative.  ? ?BP 130/72   Pulse 76   Ht '5\' 2"'$  (1.575 m)   Wt 151 lb (68.5 kg)   LMP 03/23/2011   SpO2 98%   BMI 27.62 kg/m?  ? ?  Physical Examination: ?General: Well developed, well nourished, NAD  ?HEENT: OP clear, mucus membranes moist  ?SKIN: warm, dry. No rashes. ?Neuro: No focal deficits  ?Musculoskeletal: Muscle strength 5/5 all ext  ?Psychiatric: Mood and affect normal  ?Neck: No JVD, no carotid bruits, no thyromegaly, no lymphadenopathy.  ?Lungs:Clear bilaterally, no wheezes, rhonci, crackles ?Cardiovascular: Regular rate and rhythm. No murmurs, gallops or rubs. ?Abdomen:Soft. Bowel sounds present. Non-tender.  ?Extremities: No lower extremity edema. Pulses are 2 + in the bilateral DP/PT. ? ?EKG:  EKG is not ordered today. ?The ekg ordered today demonstrates  ? ?Echo 04/17/21: ? 1. Left ventricular ejection fraction, by estimation, is 60 to 65%. The  ?left ventricle has normal function. The left ventricle has no regional  ?wall motion abnormalities. Left ventricular diastolic parameters are  ?consistent with Grade I diastolic  ?dysfunction (impaired relaxation).  ? 2. Right ventricular systolic function is normal. The right ventricular  ?size is normal. There is normal pulmonary artery systolic pressure. The  ?estimated right ventricular systolic pressure is 57.3 mmHg.  ? 3. The mitral valve is normal in structure. Mild mitral valve  ?regurgitation. No evidence of mitral stenosis.  ? 4. The aortic valve is tricuspid. Aortic valve regurgitation is not  ?visualized. No aortic stenosis is present.  ? 5. The inferior vena cava is normal in size with greater than 50%  ?respiratory variability, suggesting right atrial pressure of 3 mmHg.  ? ?Recent Labs: ?04/16/2021: TSH 1.274 ?04/18/2021: ALT 23; BUN 9; Creatinine, Ser 0.79; Hemoglobin 12.2; Magnesium 2.0; Platelets 275; Potassium 4.1; Sodium 137  ? ?Lipid Panel ?   ?Component Value Date/Time   ? CHOL 130 07/03/2020 1128  ? TRIG 221 (H) 07/03/2020 1128  ? HDL 60 07/03/2020 1128  ? CHOLHDL 2.2 07/03/2020 1128  ? CHOLHDL 4 05/25/2008 1036  ? VLDL 13.8 05/25/2008 1036  ? [place] 36 07/03/2020 1128  ?

## 2021-04-27 ENCOUNTER — Ambulatory Visit: Payer: BC Managed Care – PPO | Admitting: Cardiovascular Disease

## 2021-04-27 ENCOUNTER — Encounter: Payer: Self-pay | Admitting: Cardiovascular Disease

## 2021-04-27 VITALS — BP 130/72 | HR 76 | Ht 62.0 in | Wt 151.0 lb

## 2021-04-27 DIAGNOSIS — I493 Ventricular premature depolarization: Secondary | ICD-10-CM

## 2021-04-27 DIAGNOSIS — E785 Hyperlipidemia, unspecified: Secondary | ICD-10-CM

## 2021-04-27 DIAGNOSIS — I10 Essential (primary) hypertension: Secondary | ICD-10-CM | POA: Diagnosis not present

## 2021-04-27 MED ORDER — METOPROLOL TARTRATE 50 MG PO TABS: 50.0000 mg | ORAL_TABLET | Freq: Two times a day (BID) | ORAL | 3 refills | Status: DC

## 2021-04-27 NOTE — Patient Instructions (Signed)
Medication Instructions:  ?Your physician has recommended you make the following change in your medication:  ?1.) increase metoprolol tartrate to 50 mg twice a daily ? ?*If you need a refill on your cardiac medications before your next appointment, please call your pharmacy* ? ? ?Lab Work: ?none ? ? ?Testing/Procedures: ?none ? ? ?Follow-Up: ?At [facility], you and your health needs are our priority.  As part of our continuing mission to provide you with exceptional heart care, we have created designated Provider Care Teams.  These Care Teams include your primary Cardiologist (physician) and Advanced Practice Providers (APPs -  Physician Assistants and Nurse Practitioners) who all work together to provide you with the care you need, when you need it. ? ? ?Your next appointment:   ?12 month(s) ? ?The format for your next appointment:   ?In Person ? ?Provider:   ?Lauree Chandler, MD   ? ?  ?

## 2021-04-30 ENCOUNTER — Emergency Department (HOSPITAL_COMMUNITY)
Admission: EM | Admit: 2021-04-30 | Discharge: 2021-04-30 | Disposition: A | Discharge status: Home / Self Care | Payer: BC Managed Care – PPO | Attending: Emergency Medicine | Admitting: Emergency Medicine

## 2021-04-30 ENCOUNTER — Encounter (HOSPITAL_COMMUNITY): Payer: Self-pay

## 2021-04-30 ENCOUNTER — Emergency Department (HOSPITAL_COMMUNITY): Payer: BC Managed Care – PPO

## 2021-04-30 ENCOUNTER — Other Ambulatory Visit: Payer: Self-pay

## 2021-04-30 DIAGNOSIS — R109 Unspecified abdominal pain: Secondary | ICD-10-CM | POA: Diagnosis not present

## 2021-04-30 DIAGNOSIS — Z79899 Other long term (current) drug therapy: Secondary | ICD-10-CM | POA: Insufficient documentation

## 2021-04-30 DIAGNOSIS — R1084 Generalized abdominal pain: Secondary | ICD-10-CM | POA: Insufficient documentation

## 2021-04-30 DIAGNOSIS — R110 Nausea: Secondary | ICD-10-CM | POA: Diagnosis not present

## 2021-04-30 DIAGNOSIS — N200 Calculus of kidney: Secondary | ICD-10-CM | POA: Diagnosis not present

## 2021-04-30 DIAGNOSIS — R1032 Left lower quadrant pain: Secondary | ICD-10-CM | POA: Diagnosis not present

## 2021-04-30 DIAGNOSIS — D72829 Elevated white blood cell count, unspecified: Secondary | ICD-10-CM | POA: Insufficient documentation

## 2021-04-30 DIAGNOSIS — M545 Low back pain, unspecified: Secondary | ICD-10-CM | POA: Diagnosis not present

## 2021-04-30 DIAGNOSIS — M7989 Other specified soft tissue disorders: Secondary | ICD-10-CM | POA: Diagnosis not present

## 2021-04-30 DIAGNOSIS — I700 Atherosclerosis of aorta: Secondary | ICD-10-CM | POA: Diagnosis not present

## 2021-04-30 DIAGNOSIS — R197 Diarrhea, unspecified: Secondary | ICD-10-CM | POA: Insufficient documentation

## 2021-04-30 LAB — URINALYSIS, ROUTINE W REFLEX MICROSCOPIC
Bilirubin Urine: NEGATIVE
Glucose, UA: NEGATIVE mg/dL
Hgb urine dipstick: NEGATIVE
Ketones, ur: NEGATIVE mg/dL
Leukocytes,Ua: NEGATIVE
Nitrite: NEGATIVE
Protein, ur: NEGATIVE mg/dL
Specific Gravity, Urine: 1.015 (ref 1.005–1.030)
pH: 7 (ref 5.0–8.0)

## 2021-04-30 LAB — CBC
HCT: 41.2 % (ref 36.0–46.0)
Hemoglobin: 13.9 g/dL (ref 12.0–15.0)
MCH: 30.3 pg (ref 26.0–34.0)
MCHC: 33.7 g/dL (ref 30.0–36.0)
MCV: 90 fL (ref 80.0–100.0)
Platelets: 385 10*3/uL (ref 150–400)
RBC: 4.58 MIL/uL (ref 3.87–5.11)
RDW: 12.5 % (ref 11.5–15.5)
WBC: 11.3 10*3/uL — ABNORMAL HIGH (ref 4.0–10.5)
nRBC: 0 % (ref 0.0–0.2)

## 2021-04-30 LAB — COMPREHENSIVE METABOLIC PANEL
ALT: 26 U/L (ref 0–44)
AST: 22 U/L (ref 15–41)
Albumin: 4.2 g/dL (ref 3.5–5.0)
Alkaline Phosphatase: 64 U/L (ref 38–126)
Anion gap: 9 (ref 5–15)
BUN: 15 mg/dL (ref 6–20)
CO2: 23 mmol/L (ref 22–32)
Calcium: 9.7 mg/dL (ref 8.9–10.3)
Chloride: 106 mmol/L (ref 98–111)
Creatinine, Ser: 0.92 mg/dL (ref 0.44–1.00)
GFR, Estimated: >60 mL/min (ref >60)
Glucose, Bld: 119 mg/dL — ABNORMAL HIGH (ref 70–99)
Potassium: 3.8 mmol/L (ref 3.5–5.1)
Sodium: 138 mmol/L (ref 135–145)
Total Bilirubin: 0.4 mg/dL (ref 0.3–1.2)
Total Protein: 7.6 g/dL (ref 6.5–8.1)

## 2021-04-30 LAB — LIPASE, BLOOD: Lipase: 37 U/L (ref 11–51)

## 2021-04-30 MED ORDER — IOHEXOL 300 MG/ML  SOLN
100.0000 mL | Freq: Once | INTRAMUSCULAR | Status: AC | PRN
  Administered 2021-04-30: 100 mL via INTRAVENOUS

## 2021-04-30 MED ORDER — HYDROMORPHONE HCL 1 MG/ML IJ SOLN
0.5000 mg | INTRAMUSCULAR | Status: DC | PRN
  Administered 2021-04-30: 0.5 mg via INTRAVENOUS
  Filled 2021-04-30: qty 1

## 2021-04-30 MED ORDER — LIDOCAINE 5 % EX PTCH: 1.0000 | MEDICATED_PATCH | CUTANEOUS | 0 refills | Status: DC

## 2021-04-30 MED ORDER — SODIUM CHLORIDE 0.9 % IV BOLUS
500.0000 mL | Freq: Once | INTRAVENOUS | Status: AC
  Administered 2021-04-30: 500 mL via INTRAVENOUS

## 2021-04-30 MED ORDER — SODIUM CHLORIDE (PF) 0.9 % IJ SOLN
INTRAMUSCULAR | Status: AC
  Filled 2021-04-30: qty 50

## 2021-04-30 MED ORDER — ONDANSETRON HCL 4 MG/2ML IJ SOLN
4.0000 mg | Freq: Once | INTRAMUSCULAR | Status: AC
  Administered 2021-04-30: 4 mg via INTRAVENOUS
  Filled 2021-04-30: qty 2

## 2021-04-30 NOTE — ED Triage Notes (Signed)
Patient reports that she began having left lower back pain since yesterday and noted that she had swelling and pain to the left lower abdomen. ?Patient also c/o nausea and diarrhea. Patient also c/o numbness in her left big toes since yesterday. ?

## 2021-04-30 NOTE — Discharge Instructions (Addendum)
Recheck with your primary care provider, return to emergency room for worsening or concerning symptoms. ?Apply Lidoderm patch to left lower back for pain as prescribed. ?Recommend warm compresses for 20 minutes at a time followed by gentle stretching. ?

## 2021-04-30 NOTE — ED Notes (Signed)
Pt ambulatory without assistance.  

## 2021-04-30 NOTE — ED Provider Notes (Signed)
?[facility] ?Provider Note ? ? ?CSN: [id number] ?Arrival date & time: 04/30/21  1204 ? ?  ? ?History ? ?Chief Complaint  ?Patient presents with  ? Abdominal Pain  ? Back Pain  ? ? ?Amanda [name] is a 61 y.o. female. ? ?61 year old female presents with complaint of left lower back pain, left side abdominal pain with swelling and left great toe numbness. Onset yesterday with nausea and diarrhea, last bowel movement was earlier today. Denies fevers, chills, changes in bladder habits. Patient was admitted to the hospital at the end of last month for dehydration. Denies falls, injuries, changes in gait. Pain is steady, worse with palpation. No relief with TENS unit to back at home.  ? ? ?  ? ?Home Medications ?Prior to Admission medications   ?Medication Sig Start Date End Date Taking? Authorizing Provider  ?lidocaine (LIDODERM) 5 % Place 1 patch onto the skin daily. Remove & Discard patch within 12 hours or as directed by MD 04/30/21  Yes Tacy Learn, PA-C  ?ALPRAZolam (XANAX) 1 MG tablet TAKE 1 TABLET AT BEDTIME AND 1/2-1 TABLET AS NEEDED FOR ANXIETY. ?Patient taking differently: Take 1 mg by mouth at bedtime. 12/15/20   Thayer Headings, PMHNP  ?brexpiprazole (REXULTI) 1 MG TABS tablet Take 1 tablet (1 mg total) by mouth daily. ?Patient taking differently: Take 1 mg by mouth every morning. 03/23/21 04/22/21  Thayer Headings, [place]  ?Cholecalciferol (VITAMIN D3 PO) Take 1 tablet by mouth every morning.    [provider]  ?Cyanocobalamin (VITAMIN B-12 PO) Take 1 tablet by mouth every morning.    [provider]  ?escitalopram (LEXAPRO) 20 MG tablet Take 1 tablet (20 mg total) by mouth daily. ?Patient taking differently: Take 20 mg by mouth every morning. 02/13/21   Thayer Headings, PMHNP  ?esomeprazole (NEXIUM) 20 MG capsule Take 20 mg by mouth every morning.    [provider]  ?hydrALAZINE (APRESOLINE) 50 MG tablet Take 1 tablet (50 mg total) by mouth every  8 (eight) hours. 04/18/21   Regalado, Belkys A, MD  ?metoprolol tartrate (LOPRESSOR) 50 MG tablet Take 1 tablet (50 mg total) by mouth 2 (two) times daily. 04/27/21   Burnell Blanks, MD  ?REPATHA SURECLICK 222 MG/ML SOAJ INJECT 1 PEN INTO THE SKIN EVERY 14 DAYS ?Patient taking differently: Inject 140 mg into the skin every 14 (fourteen) days. Every other Monday 03/13/21   Burnell Blanks, MD  ?rizatriptan (MAXALT) 10 MG tablet Take 10 mg by mouth See admin instructions. Take one tablet (10 mg) by mouth at onset of migraine headache, may repeat in 2 hours if still needed.    [provider]  ?zaleplon (SONATA) 10 MG capsule Take 1 capsule (10 mg total) by mouth at bedtime as needed for sleep. ?Patient taking differently: Take 10 mg by mouth at bedtime. 03/07/21   Thayer Headings, [place]  ?   ? ?Allergies    ?Ace inhibitors, Ambien [zolpidem], Amoxicillin, Codeine, Cortisone, Felodipine, Hydrocodone-acetaminophen, Lisinopril, Losartan potassium, Nexletol [bempedoic acid], Venlafaxine, Zetia [ezetimibe], Crestor [rosuvastatin], Lamictal [lamotrigine], and Statins   ? ?Review of Systems   ?Review of Systems ?Negative except as per HPI ?Physical Exam ?Updated Vital Signs ?BP (!) 141/76   Pulse 64   Temp 97.9 ?F (36.6 ?C) (Oral)   Resp (!) 79   Ht '5\' 2"'$  (1.575 m)   Wt 68 kg   LMP 03/23/2011   SpO2 96%   BMI 27.44 kg/m?  ?Physical  Exam ?Vitals and nursing note reviewed.  ?Constitutional:   ?   General: She is not in acute distress. ?   Appearance: She is well-developed. She is not diaphoretic.  ?HENT:  ?   Head: Normocephalic and atraumatic.  ?Cardiovascular:  ?   Rate and Rhythm: Normal rate and regular rhythm.  ?   Heart sounds: Normal heart sounds.  ?Pulmonary:  ?   Effort: Pulmonary effort is normal.  ?   Breath sounds: Normal breath sounds.  ?Abdominal:  ?   Palpations: Abdomen is soft.  ?   Tenderness: There is abdominal tenderness in the suprapubic area, left upper quadrant and left lower  quadrant. There is no right CVA tenderness or left CVA tenderness.  ?Musculoskeletal:  ?   Comments: Reports diminished sensation to left great toe compared to right, extends through arch of left foot  ?Skin: ?   General: Skin is warm and dry.  ?   Coloration: Skin is not pale.  ?   Findings: No erythema or rash.  ?Neurological:  ?   General: No focal deficit present.  ?   Mental Status: She is alert and oriented to person, place, and time.  ?   Motor: No weakness.  ?Psychiatric:     ?   Behavior: Behavior normal.  ? ? ?ED Results / Procedures / Treatments   ?Labs ?(all labs ordered are listed, but only abnormal results are displayed) ?Labs Reviewed  ?COMPREHENSIVE METABOLIC PANEL - Abnormal; Notable for the following components:  ?    Result Value  ? Glucose, Bld 119 (*)   ? All other components within normal limits  ?CBC - Abnormal; Notable for the following components:  ? WBC 11.3 (*)   ? All other components within normal limits  ?URINALYSIS, ROUTINE W REFLEX MICROSCOPIC - Abnormal; Notable for the following components:  ? Color, Urine COLORLESS (*)   ? All other components within normal limits  ?LIPASE, BLOOD  ? ? ?EKG ?None ? ?Radiology ?CT Abdomen Pelvis W Contrast ? ?Result Date: 04/30/2021 ?CLINICAL DATA:  Pain and swelling involving the left low back and left lower quadrant of the abdomen with nausea, diarrhea, and numbness in the left great toe. EXAM: CT ABDOMEN AND PELVIS WITH CONTRAST TECHNIQUE: Multidetector CT imaging of the abdomen and pelvis was performed using the standard protocol following bolus administration of intravenous contrast. RADIATION DOSE REDUCTION: This exam was performed according to the departmental dose-optimization program which includes automated exposure control, adjustment of the mA and/or kV according to patient size and/or use of iterative reconstruction technique. CONTRAST:  173m OMNIPAQUE IOHEXOL 300 MG/ML  SOLN COMPARISON:  CT abdomen and pelvis 03/22/2020 FINDINGS: Lower  chest: No consolidation or pleural effusion in the included lung bases. Hepatobiliary: Unchanged 1.5 cm calcified gallstone. No pericholecystic inflammation, biliary dilatation, or focal liver abnormality identified. Pancreas: Unremarkable. Spleen: Unremarkable. Adrenals/Urinary Tract: Unremarkable adrenal glands. 1 mm calculus in the interpolar left kidney. No hydronephrosis or renal mass. Unremarkable bladder. Stomach/Bowel: The stomach is unremarkable. There is no evidence of bowel obstruction or inflammation. History of appendectomy. Vascular/Lymphatic: Mild abdominal aortic atherosclerosis without aneurysm. No enlarged lymph nodes. Reproductive: Unchanged 1.5 cm calcified uterine fibroid. No adnexal mass. Other: No ascites or pneumoperitoneum. Musculoskeletal: No acute osseous abnormality or suspicious osseous lesion. Partially visualized hemangioma in the T9 vertebral body. Multilevel lumbar disc and facet degeneration with chronic grade 1 anterolisthesis and mild-to-moderate left greater than right neural foraminal stenosis at L5-S1. IMPRESSION: 1. No acute abnormality identified in  the abdomen or pelvis. 2. Cholelithiasis. 3. Punctate nonobstructing left renal calculus. 4. Aortic Atherosclerosis (ICD10-I70.0). Electronically Signed   By: Logan Bores M.D.   On: 04/30/2021 14:00   ? ?Procedures ?Procedures  ? ? ?Medications Ordered in ED ?Medications  ?HYDROmorphone (DILAUDID) injection 0.5 mg (0.5 mg Intravenous Given 04/30/21 1256)  ?sodium chloride (PF) 0.9 % injection (has no administration in time range)  ?sodium chloride 0.9 % bolus 500 mL (0 mLs Intravenous Stopped 04/30/21 1345)  ?ondansetron (ZOFRAN) injection 4 mg (4 mg Intravenous Given 04/30/21 1255)  ?iohexol (OMNIPAQUE) 300 MG/ML solution 100 mL (100 mLs Intravenous Contrast Given 04/30/21 1334)  ? ? ?ED Course/ Medical Decision Making/ A&P ?  ?                        ?Medical Decision Making ?Amount and/or Complexity of Data Reviewed ?Labs:  ordered. ?Radiology: ordered. ? ?Risk ?Prescription drug management. ? ? ?This patient presents to the ED for concern of left-sided abdominal pain with low back pain and left great toe numbness with an episode of loose

## 2021-05-07 DIAGNOSIS — R1032 Left lower quadrant pain: Secondary | ICD-10-CM | POA: Diagnosis not present

## 2021-05-07 DIAGNOSIS — M5416 Radiculopathy, lumbar region: Secondary | ICD-10-CM | POA: Diagnosis not present

## 2021-05-07 DIAGNOSIS — I129 Hypertensive chronic kidney disease with stage 1 through stage 4 chronic kidney disease, or unspecified chronic kidney disease: Secondary | ICD-10-CM | POA: Diagnosis not present

## 2021-05-09 DIAGNOSIS — K8020 Calculus of gallbladder without cholecystitis without obstruction: Secondary | ICD-10-CM | POA: Diagnosis not present

## 2021-05-09 DIAGNOSIS — M549 Dorsalgia, unspecified: Secondary | ICD-10-CM | POA: Diagnosis not present

## 2021-05-09 DIAGNOSIS — R1032 Left lower quadrant pain: Secondary | ICD-10-CM | POA: Diagnosis not present

## 2021-05-12 ENCOUNTER — Encounter: Payer: Self-pay | Admitting: Cardiovascular Disease

## 2021-05-12 DIAGNOSIS — I10 Essential (primary) hypertension: Secondary | ICD-10-CM

## 2021-05-13 ENCOUNTER — Other Ambulatory Visit: Payer: Self-pay

## 2021-05-13 DIAGNOSIS — F5104 Psychophysiologic insomnia: Secondary | ICD-10-CM

## 2021-05-14 ENCOUNTER — Telehealth: Payer: Self-pay | Admitting: Psychiatry

## 2021-05-14 DIAGNOSIS — E559 Vitamin D deficiency, unspecified: Secondary | ICD-10-CM | POA: Diagnosis not present

## 2021-05-14 DIAGNOSIS — I129 Hypertensive chronic kidney disease with stage 1 through stage 4 chronic kidney disease, or unspecified chronic kidney disease: Secondary | ICD-10-CM | POA: Diagnosis not present

## 2021-05-14 DIAGNOSIS — M5416 Radiculopathy, lumbar region: Secondary | ICD-10-CM | POA: Diagnosis not present

## 2021-05-14 DIAGNOSIS — R1032 Left lower quadrant pain: Secondary | ICD-10-CM | POA: Diagnosis not present

## 2021-05-14 DIAGNOSIS — M5451 Vertebrogenic low back pain: Secondary | ICD-10-CM | POA: Diagnosis not present

## 2021-05-14 DIAGNOSIS — E538 Deficiency of other specified B group vitamins: Secondary | ICD-10-CM | POA: Diagnosis not present

## 2021-05-14 DIAGNOSIS — E782 Mixed hyperlipidemia: Secondary | ICD-10-CM | POA: Diagnosis not present

## 2021-05-14 DIAGNOSIS — Z0000 Encounter for general adult medical examination without abnormal findings: Secondary | ICD-10-CM | POA: Diagnosis not present

## 2021-05-14 MED ORDER — ZALEPLON 10 MG PO CAPS: 10.0000 mg | ORAL_CAPSULE | Freq: Every evening | ORAL | 1 refills | Status: DC | PRN

## 2021-05-14 NOTE — Telephone Encounter (Signed)
Patient referred to [facility] for hypertension management per Dr. Angelena Form. ?

## 2021-05-14 NOTE — Telephone Encounter (Signed)
Pt LVM on 4/15 @ 10:55a.  She would like refill of Zaleplon to ? ?[id number] - [place], [name]  ?[name] [place] [address]  ?Phone:  [phone]  Fax:  [phone]  ? ?She will be out on Monday. ? ?Next appt 4/20 ?

## 2021-05-14 NOTE — Telephone Encounter (Signed)
Please let her know it was sent this morning.  ?

## 2021-05-15 ENCOUNTER — Telehealth: Payer: Self-pay | Admitting: Psychiatry

## 2021-05-15 DIAGNOSIS — F5104 Psychophysiologic insomnia: Secondary | ICD-10-CM

## 2021-05-15 MED ORDER — ZALEPLON 10 MG PO CAPS: 10.0000 mg | ORAL_CAPSULE | Freq: Every evening | ORAL | 1 refills | Status: DC | PRN

## 2021-05-15 NOTE — Telephone Encounter (Signed)
Please let her know it was sent again.  ?

## 2021-05-15 NOTE — Telephone Encounter (Signed)
Patient called in regarding prescription for Zaleplon '10mg'$ . She spoke with pharmacy this morning and was informed they had not received the prescription. Please send to [facility] [place] Ph: [phone] ?

## 2021-05-17 ENCOUNTER — Encounter: Payer: Self-pay | Admitting: Psychiatry

## 2021-05-17 ENCOUNTER — Ambulatory Visit: Payer: BC Managed Care – PPO | Admitting: Psychiatry

## 2021-05-17 DIAGNOSIS — F5104 Psychophysiologic insomnia: Secondary | ICD-10-CM | POA: Diagnosis not present

## 2021-05-17 DIAGNOSIS — F32A Depression, unspecified: Secondary | ICD-10-CM

## 2021-05-17 DIAGNOSIS — F419 Anxiety disorder, unspecified: Secondary | ICD-10-CM

## 2021-05-17 MED ORDER — ALPRAZOLAM 1 MG PO TABS: ORAL_TABLET | ORAL | 5 refills | Status: DC

## 2021-05-17 MED ORDER — AUVELITY 45-105 MG PO TBCR: EXTENDED_RELEASE_TABLET | ORAL | 0 refills | Status: DC

## 2021-05-17 MED ORDER — ESCITALOPRAM OXALATE 20 MG PO TABS: 20.0000 mg | ORAL_TABLET | Freq: Every day | ORAL | 0 refills | Status: DC

## 2021-05-17 NOTE — Progress Notes (Signed)
Amanda [name] ?[id number] ?November 16, 1960 ?61 y.o. ? ?Subjective:  ? ?Patient ID:  Amanda [name] is a 61 y.o. (DOB [birth year]) female. ? ?Chief Complaint:  ?Chief Complaint  ?Patient presents with  ? Depression  ? Anxiety  ? Follow-up  ?  Insomnia  ? ? ?HPI ?Amanda [name] presents to the office today for follow-up of depression ?She reports that she has "been depressed and had anxiety." She reports motivation and "just want to stay in bed." She reports that she is not interested in things. Has been socially withdrawn. She reports that she had severe anxiety since going into the hospital. She reports that she had some panic s/s in the hospital. She notices increased HR at times with anxiety. She has had some worry. Appetite has been ok. Difficulty with concentration. Denies SI.  ? ?She reports that she is sleeping all night with Sonata 10 mg and Xanax.  ? ?She reports that she was hospitalized after she had a syncopal episode.  ? ?She has not been able to work for the last month.  ? ?Past Psychiatric Medication Trials: ?Wellbutrin XL- No improvement at 300 mg dose. ?Trintellix ?Lexapro- "felt the best on." Reports that she had some hair loss ?Zoloft ?Celexa ?Viibryd ?Prozac ?Effexor ?Remeron ?Ambien-Effective and then no longer effective. Sleep was not restful. Parasomnias. ?Lunesta-Ineffective ?Belsomra-Ineffective ?Trazodone-ineffective.  ?Doxepin-Ineffective ?Klonopin ?xanax ?Gabapentin ?Abilify ?Rexulti- Akathisia. No benefit ?Deplin ? ?Flowsheet Row ED from 04/30/2021 in [facility]PT ED to Hosp-Admission (Discharged) from 04/16/2021 in [facility] ED to Hosp-Admission (Discharged) from 03/21/2020 in [facility]  ?C-SSRS RISK CATEGORY No Risk No Risk No Risk  ? ?  ?  ? ?Review of Systems:  ?Review of Systems  ?Gastrointestinal:  Positive for abdominal pain.  ?Musculoskeletal:  Positive for back pain. Negative for gait  problem.  ?Neurological:  Negative for tremors.  ?Psychiatric/Behavioral:    ?     Please refer to HPI  ? ?Medications: I have reviewed the patient's current medications. ? ?Current Outpatient Medications  ?Medication Sig Dispense Refill  ? Cholecalciferol (VITAMIN D3 PO) Take 1 tablet by mouth every morning.    ? Cyanocobalamin (VITAMIN B-12 PO) Take 1 tablet by mouth every morning.    ? Dextromethorphan-buPROPion ER (AUVELITY) 45-105 MG TBCR Take 1 tablet daily for 3 days, then increase to 1 tablet twice daily 30 tablet 0  ? esomeprazole (NEXIUM) 20 MG capsule Take 20 mg by mouth every morning.    ? hydrALAZINE (APRESOLINE) 50 MG tablet Take 1 tablet (50 mg total) by mouth every 8 (eight) hours. 90 tablet 1  ? metoprolol tartrate (LOPRESSOR) 50 MG tablet Take 1 tablet (50 mg total) by mouth 2 (two) times daily. 180 tablet 3  ? REPATHA SURECLICK 122 MG/ML SOAJ INJECT 1 PEN INTO THE SKIN EVERY 14 DAYS (Patient taking differently: Inject 140 mg into the skin every 14 (fourteen) days. Every other Monday) 2 mL 11  ? rizatriptan (MAXALT) 10 MG tablet Take 10 mg by mouth See admin instructions. Take one tablet (10 mg) by mouth at onset of migraine headache, may repeat in 2 hours if still needed.    ? zaleplon (SONATA) 10 MG capsule Take 1 capsule (10 mg total) by mouth at bedtime as needed for sleep. 30 capsule 1  ? [START ON 06/12/2021] ALPRAZolam (XANAX) 1 MG tablet TAKE 1 TABLET AT BEDTIME AND 1/2-1 TABLET AS NEEDED FOR ANXIETY. 45 tablet  5  ? escitalopram (LEXAPRO) 20 MG tablet Take 1 tablet (20 mg total) by mouth daily. 90 tablet 0  ? lidocaine (LIDODERM) 5 % Place 1 patch onto the skin daily. Remove & Discard patch within 12 hours or as directed by MD (Patient not taking: Reported on 05/17/2021) 30 patch 0  ? ?No current facility-administered medications for this visit.  ? ? ?Medication Side Effects: None ? ?Allergies:  ?Allergies  ?Allergen Reactions  ? Ace Inhibitors Cough  ? Ambien [Zolpidem] Other (See Comments)   ?  Other reaction(s): headache/neck throbbing  ? Amoxicillin Other (See Comments)  ?  Gi upset  ? Codeine Nausea And Vomiting  ?  Can tolerate hydrocodone if has food on stomach ? ?  ? Cortisone Other (See Comments)  ?  Extremely high BP- reaction to injection  ? Felodipine Other (See Comments)  ?   increased flushing and made her feel bad  ? Hydrocodone-Acetaminophen Nausea And Vomiting  ?  Must eat before and after each dose  ? Lisinopril Swelling and Cough  ?  Throat swelling  ? Losartan Potassium Cough  ? Nexletol [Bempedoic Acid] Other (See Comments)  ?  Brain fog  ? Venlafaxine Other (See Comments)  ?  headaches/elevated blood pressure  ? Zetia [Ezetimibe] Diarrhea  ? Crestor [Rosuvastatin] Rash  ?  facial rash  ? Lamictal [Lamotrigine] Rash  ? Statins Rash  ?  Face and leg rash with rosuvastatin, pravastatin, and atorvastatin  ? ? ?Past Medical History:  ?Diagnosis Date  ? Anxiety   ? Asthma   ? Chronic kidney disease   ? Chronic kidney disease, stage 3 (Hand) 08/10/2019  ? Depression   ? Hyperlipidemia   ? Hypertension   ? Migraine   ? ? ?Past Medical History, Surgical history, Social history, and Family history were reviewed and updated as appropriate.  ? ?Please see review of systems for further details on the patient's review from today.  ? ?Objective:  ? ?Physical Exam:  ?LMP 03/23/2011  ? ?Physical Exam ?Constitutional:   ?   General: She is not in acute distress. ?Musculoskeletal:     ?   General: No deformity.  ?Neurological:  ?   Mental Status: She is alert and oriented to person, place, and time.  ?   Coordination: Coordination normal.  ?Psychiatric:     ?   Attention and Perception: Attention and perception normal. She does not perceive auditory or visual hallucinations.     ?   Mood and Affect: Mood is anxious and depressed. Affect is not labile, blunt, angry or inappropriate.     ?   Speech: Speech normal.     ?   Behavior: Behavior normal.     ?   Thought Content: Thought content normal. Thought  content is not paranoid or delusional. Thought content does not include homicidal or suicidal ideation. Thought content does not include homicidal or suicidal plan.     ?   Cognition and Memory: Cognition and memory normal.     ?   Judgment: Judgment normal.  ?   Comments: Insight intact  ? ? ?Lab Review:  ?   ?Component Value Date/Time  ? NA 138 04/30/2021 1234  ? NA 138 11/03/2019 1107  ? K 3.8 04/30/2021 1234  ? CL 106 04/30/2021 1234  ? CO2 23 04/30/2021 1234  ? GLUCOSE 119 (H) 04/30/2021 1234  ? BUN 15 04/30/2021 1234  ? BUN 17 11/03/2019 1107  ? CREATININE 0.92 04/30/2021  1234  ? CALCIUM 9.7 04/30/2021 1234  ? PROT 7.6 04/30/2021 1234  ? PROT 6.6 07/03/2020 1128  ? ALBUMIN 4.2 04/30/2021 1234  ? ALBUMIN 3.9 07/03/2020 1128  ? AST 22 04/30/2021 1234  ? ALT 26 04/30/2021 1234  ? ALKPHOS 64 04/30/2021 1234  ? BILITOT 0.4 04/30/2021 1234  ? BILITOT 0.5 07/03/2020 1128  ? GFRNONAA >60 04/30/2021 1234  ? GFRAA 38 (L) 11/03/2019 1107  ? ? ?   ?Component Value Date/Time  ? WBC 11.3 (H) 04/30/2021 1234  ? RBC 4.58 04/30/2021 1234  ? HGB 13.9 04/30/2021 1234  ? HCT 41.2 04/30/2021 1234  ? PLT 385 04/30/2021 1234  ? MCV 90.0 04/30/2021 1234  ? MCH 30.3 04/30/2021 1234  ? MCHC 33.7 04/30/2021 1234  ? RDW 12.5 04/30/2021 1234  ? LYMPHSABS 3.3 04/18/2021 0415  ? MONOABS 0.7 04/18/2021 0415  ? EOSABS 0.1 04/18/2021 0415  ? BASOSABS 0.1 04/18/2021 0415  ? ? ?No results found for: POCLITH, LITHIUM  ? ?No results found for: PHENYTOIN, PHENOBARB, VALPROATE, CBMZ  ? ?.res ?Assessment: Plan:   ?Pt seen for 30 minutes and time spent reviewing record from medical hospitalization and discussing treatment options for depression. Discussed potential benefits, risks, and side effects of Auvelity. Discussed that Auvelity has a different mechanism of action from other medications that she has tried in the past since she has had limited improvement and adverse effects with multiple SSRI's, atypical antipsychotics, Lamictal, and novel  antidepressants. Pt agrees to trial of Auvelity.  ?Will start Auvelity one tablet daily for 3 days, then increase to 1 tablet twice daily for depression.  ?Will continue Lexapro 20 mg po qd for anxiety and depres

## 2021-05-20 ENCOUNTER — Telehealth: Payer: Self-pay

## 2021-05-20 NOTE — Telephone Encounter (Signed)
Prior Authorization submitted and approved for AUVELITY 45-100 MG #60 with [employer] ID# [id number] effective 05/18/2021-05/17/2022 ?

## 2021-05-21 ENCOUNTER — Telehealth: Payer: Self-pay | Admitting: Psychiatry

## 2021-05-21 NOTE — Telephone Encounter (Signed)
Cee Cee at [facility] LVM @ 4:15p on 4/22.  She stated that pt has been approved for [employer].  Pt has been notified. ? ?Next appt 5/18 ?

## 2021-05-22 ENCOUNTER — Encounter: Payer: Self-pay | Admitting: Psychiatry

## 2021-05-22 ENCOUNTER — Telehealth: Payer: Self-pay | Admitting: Psychiatry

## 2021-05-22 DIAGNOSIS — F5104 Psychophysiologic insomnia: Secondary | ICD-10-CM

## 2021-05-22 DIAGNOSIS — F32A Depression, unspecified: Secondary | ICD-10-CM

## 2021-05-22 NOTE — Telephone Encounter (Signed)
I did ask Pt and she said she has card. It was more than $128. Not sure? ?

## 2021-05-22 NOTE — Telephone Encounter (Signed)
Would you please ask if she tried using the Auvelity copay card? If she does not have one, she can come and pick one up or download one from the website.  ?

## 2021-05-22 NOTE — Telephone Encounter (Signed)
Pt called reporting after PA for Auvelity she would still have to pay $128 monthly. Can't afford. Asking you to change to some other med. Pt contact # [phone] ?

## 2021-05-23 DIAGNOSIS — N200 Calculus of kidney: Secondary | ICD-10-CM | POA: Diagnosis not present

## 2021-05-23 DIAGNOSIS — R1032 Left lower quadrant pain: Secondary | ICD-10-CM | POA: Diagnosis not present

## 2021-05-23 MED ORDER — AUVELITY 45-105 MG PO TBCR: EXTENDED_RELEASE_TABLET | ORAL | 1 refills | Status: DC

## 2021-05-24 MED ORDER — DULOXETINE HCL 60 MG PO CPEP
60.0000 mg | ORAL_CAPSULE | Freq: Every day | ORAL | 1 refills | Status: DC
Start: 2021-05-24 — End: 2021-06-08

## 2021-05-24 MED ORDER — DULOXETINE HCL 30 MG PO CPEP: ORAL_CAPSULE | ORAL | 0 refills | Status: DC

## 2021-05-24 NOTE — Addendum Note (Signed)
Addended by: Sharyl Nimrod on: 05/24/2021 12:58 PM ? ? Modules accepted: Orders ? ?

## 2021-06-04 ENCOUNTER — Ambulatory Visit: Payer: BC Managed Care – PPO | Admitting: Physician Assistant

## 2021-06-08 MED ORDER — LURASIDONE HCL 40 MG PO TABS: ORAL_TABLET | ORAL | 1 refills | Status: DC

## 2021-06-08 MED ORDER — DULOXETINE HCL 60 MG PO CPEP: 60.0000 mg | ORAL_CAPSULE | Freq: Every day | ORAL | 1 refills | Status: DC

## 2021-06-08 NOTE — Addendum Note (Signed)
Addended by: Sharyl Nimrod on: 06/08/2021 12:16 PM ? ? Modules accepted: Orders ? ?

## 2021-06-08 NOTE — Addendum Note (Signed)
Addended by: Sharyl Nimrod on: 06/08/2021 12:48 PM ? ? Modules accepted: Orders ? ?

## 2021-06-11 DIAGNOSIS — M545 Low back pain, unspecified: Secondary | ICD-10-CM | POA: Diagnosis not present

## 2021-06-11 DIAGNOSIS — R1032 Left lower quadrant pain: Secondary | ICD-10-CM | POA: Diagnosis not present

## 2021-06-11 NOTE — Progress Notes (Signed)
Patient ID: Amanda [name]                 DOB: [birth year]                      MRN: [id number] ? ? ? ? ?HPI: ?Amanda [name] is a 61 y.o. female referred by Dr. Angelena Form to HTN clinic. PMH is significant for HTN, HLD, palpitations (PACs/PVCs), CKD3, asthma, GERD, and anxiety/depression. Admitted 3/20-3/22/23 following syncopal episode likely from dehydration (hyponatremia Na 132 mmol/L, hypokalemia K 3.4 mmol/L) with mild worsening of renal function. Stopped HCTZ and started hydralazine for BP control after admission. At most recent visit with Dr. Angelena Form increased metoprolol to 50 mg BID. Pt message indicates that BP was still elevated to 160/86 after increasing metoprolol. BP was 136/92 at primary care appt on 05/23/21. ? ?Patient presents today for HTN management. Pt is frustrated that BP is uncontrolled on current regimen after being controlled on HCTZ for 15 years. At time of syncope episode in March, pt was doing [place] diet (drinking 80 oz water/day, using diet protein packets, cooking one lean and green meal/day). Reports that although she was very hydrated, she was urinating frequently on HCTZ. Doing ok on new medications. Endorses nausea throughout the day which she correlates with the timing of her hydralazine doses. Nausea has been ongoing since hospital discharge. Adherent to regimen; only missed one dose of hydralazine in the past two weeks. However, she would prefer not to take a TID medication as she often pushes back her doses if she forgets to take them ~8 hour mark. Reports BP was also previously controlled on lisinopril, despite dry cough. Denied any history of lip, eye, or throat swelling on ACEi. Pt does not remember trial of losartan. Says her PCP took her off of ACEi as soon as she reported her cough (after having that symptom for months). Would overall prefer to experience ACEi cough again than continue hydral TID. PCP discouraged her from re-trialing a calcium channel blocker given  flushing on felodipine in the past. Reports recent increase in depression symptoms prompting switch from Lexapro to [place]. Pt has not checked her BP at home since 4/13 when it was 146/86 mmHg. Of note metoprolol was increased on 4/15. Her BP cuff is ~61 years old. Denies dizziness, lightheadedness, LEE, HA, or blurred vision.  ? ?Current HTN meds: metoprolol 50 mg BID (8AM, 7PM), hydralazine 50 mg TID (8AM, 2:30PM, 10:30PM) ?Previously tried: lisinopril (throat swelling, cough), losartan (cough), felodipine (increased flushing, malaise) ?BP goal: <130/80 mmHg ? ?Family History: HLD in mother/father/sister, CAD s/p CABG in father, HTN in sister,  ? ?Social History: Former smoker (2.5 pack year history, quit 1989). Drinks alcohol occasionally in social settings. Denies drug use.  ? ?Diet: Tries not to add salt to foods. Tries to avoid fried foods.  ? ?Exercise: Walking 1 mile/day ? ?Clinic BP readings: 135/68, 126/68, 124/64 ? ?Wt Readings from Last 3 Encounters:  ?04/30/21 150 lb (68 kg)  ?04/27/21 151 lb (68.5 kg)  ?04/18/21 148 lb 2.4 oz (67.2 kg)  ? ?BP Readings from Last 3 Encounters:  ?04/30/21 (!) 141/76  ?04/27/21 130/72  ?04/18/21 (!) 171/91  ? ?Pulse Readings from Last 3 Encounters:  ?04/30/21 64  ?04/27/21 76  ?04/18/21 78  ? ? ?Renal function: ?CrCl cannot be calculated (Patient's most recent lab result is older than the maximum 21 days allowed.). ? ?Past Medical History:  ?Diagnosis Date  ? Anxiety   ?  Asthma   ? Chronic kidney disease   ? Chronic kidney disease, stage 3 ([place]) 08/10/2019  ? Depression   ? Hyperlipidemia   ? Hypertension   ? Migraine   ? ? ?Current Outpatient Medications on File Prior to Visit  ?Medication Sig Dispense Refill  ? [START ON 06/12/2021] ALPRAZolam (XANAX) 1 MG tablet TAKE 1 TABLET AT BEDTIME AND 1/2-1 TABLET AS NEEDED FOR ANXIETY. 45 tablet 5  ? Cholecalciferol (VITAMIN D3 PO) Take 1 tablet by mouth every morning.    ? Cyanocobalamin (VITAMIN B-12 PO) Take 1 tablet  by mouth every morning.    ? Dextromethorphan-buPROPion ER (AUVELITY) 45-105 MG TBCR Take 1 tablet daily for 3 days, then increase to 1 tablet twice daily 60 tablet 1  ? DULoxetine (CYMBALTA) 30 MG capsule Take 1 capsule po q am x 1 week, then 2 capsules po q am 30 capsule 0  ? DULoxetine (CYMBALTA) 60 MG capsule Take 1 capsule (60 mg total) by mouth daily. 30 capsule 1  ? escitalopram (LEXAPRO) 20 MG tablet Take 1 tablet (20 mg total) by mouth daily. 90 tablet 0  ? esomeprazole (NEXIUM) 20 MG capsule Take 20 mg by mouth every morning.    ? hydrALAZINE (APRESOLINE) 50 MG tablet Take 1 tablet (50 mg total) by mouth every 8 (eight) hours. 90 tablet 1  ? lidocaine (LIDODERM) 5 % Place 1 patch onto the skin daily. Remove & Discard patch within 12 hours or as directed by MD (Patient not taking: Reported on 05/17/2021) 30 patch 0  ? lurasidone (LATUDA) 40 MG TABS tablet Take 1/2 tablet daily with supper for one week, then increase to 1 tablet daily with supper 30 tablet 1  ? metoprolol tartrate (LOPRESSOR) 50 MG tablet Take 1 tablet (50 mg total) by mouth 2 (two) times daily. 180 tablet 3  ? REPATHA SURECLICK 045 MG/ML SOAJ INJECT 1 PEN INTO THE SKIN EVERY 14 DAYS (Patient taking differently: Inject 140 mg into the skin every 14 (fourteen) days. Every other Monday) 2 mL 11  ? rizatriptan (MAXALT) 10 MG tablet Take 10 mg by mouth See admin instructions. Take one tablet (10 mg) by mouth at onset of migraine headache, may repeat in 2 hours if still needed.    ? zaleplon (SONATA) 10 MG capsule Take 1 capsule (10 mg total) by mouth at bedtime as needed for sleep. 30 capsule 1  ? ?No current facility-administered medications on file prior to visit.  ? ? ?Allergies  ?Allergen Reactions  ? Ace Inhibitors Cough  ? Ambien [Zolpidem] Other (See Comments)  ?  Other reaction(s): headache/neck throbbing  ? Amoxicillin Other (See Comments)  ?  Gi upset  ? Codeine Nausea And Vomiting  ?  Can tolerate hydrocodone if has food on stomach ? ?   ? Cortisone Other (See Comments)  ?  Extremely high BP- reaction to injection  ? Felodipine Other (See Comments)  ?   increased flushing and made her feel bad  ? Hydrocodone-Acetaminophen Nausea And Vomiting  ?  Must eat before and after each dose  ? Lisinopril Swelling and Cough  ?  Throat swelling  ? Losartan Potassium Cough  ? Nexletol [Bempedoic Acid] Other (See Comments)  ?  Brain fog  ? Venlafaxine Other (See Comments)  ?  headaches/elevated blood pressure  ? Zetia [Ezetimibe] Diarrhea  ? Crestor [Rosuvastatin] Rash  ?  facial rash  ? Lamictal [Lamotrigine] Rash  ? Statins Rash  ?  Face and leg rash  with rosuvastatin, pravastatin, and atorvastatin  ? ? ? ?Assessment/Plan: ? ?1. Hypertension - Clinic BP controlled at 124/64 mmHg at goal <130/80 mmHg on hydralazine 50 mg TID and metoprolol tartrate 50 mg BID. However, frequent dosing and nausea associated with hydralazine is decreasing pt QoL necessitating change in therapy. Will stop hydralazine 50 mg TID. Corrected documented allergy to lisinopril in chart to reflect pt symptom of dry cough. Will start valsartan 80 mg daily and obtain BMP to monitor Scr and K at 1 month follow-up. Should avoid re-trial of thiazide diuretic given recent syncope and electrolyte disturbances with improved BMP after medication discontinuation in March, although diet at time of hospitalization may have influenced electrolytes. Although pt hesitant to re-trial a calcium channel blocker, low-dose amlodipine could also be a future option given lower incidence of flushing compared to felodipine (<3% vs 4-7%) if further BP control needed in the future. To maintain BP control, will also switch pt from metoprolol 50 mg BID to equivalent dose of carvedilol 12.5 mg BID. Pt amenable to plan. Instructed pt to bring home BP cuff and log to next appt to validate cuff. Will plan to titrate valsartan if pt is tolerating.  ? ?Pt seen with Park Liter, P4 pharmacy student ? ?Megan E. Supple, PharmD,  BCACP, CPP ?[id number] [place], [place] [address] ?Phone: [phone]; Fax: [phone] ?06/12/2021 3:37 PM ? ? ?

## 2021-06-12 ENCOUNTER — Ambulatory Visit: Payer: BC Managed Care – PPO | Admitting: Pharmacist

## 2021-06-12 VITALS — BP 124/64

## 2021-06-12 DIAGNOSIS — I10 Essential (primary) hypertension: Secondary | ICD-10-CM

## 2021-06-12 MED ORDER — CARVEDILOL 12.5 MG PO TABS: 12.5000 mg | ORAL_TABLET | Freq: Two times a day (BID) | ORAL | 5 refills | Status: DC

## 2021-06-12 MED ORDER — VALSARTAN 80 MG PO TABS: 80.0000 mg | ORAL_TABLET | Freq: Every day | ORAL | 5 refills | Status: DC

## 2021-06-12 NOTE — Patient Instructions (Addendum)
It was great to see you today! ? ?Stop taking metoprolol and hydralazine. ?Start taking carvedilol 12.5 mg twice daily ?Start taking valsartan 80 mg daily  ? ?Please bring your blood pressure cuff and home readings to next appointment.  ?

## 2021-06-14 ENCOUNTER — Telehealth (INDEPENDENT_AMBULATORY_CARE_PROVIDER_SITE_OTHER): Payer: BC Managed Care – PPO | Admitting: Psychiatry

## 2021-06-14 ENCOUNTER — Encounter: Payer: Self-pay | Admitting: Psychiatry

## 2021-06-14 DIAGNOSIS — F419 Anxiety disorder, unspecified: Secondary | ICD-10-CM

## 2021-06-14 DIAGNOSIS — F5104 Psychophysiologic insomnia: Secondary | ICD-10-CM | POA: Diagnosis not present

## 2021-06-14 DIAGNOSIS — F32A Depression, unspecified: Secondary | ICD-10-CM | POA: Diagnosis not present

## 2021-06-14 MED ORDER — ZALEPLON 10 MG PO CAPS: 10.0000 mg | ORAL_CAPSULE | Freq: Every evening | ORAL | 2 refills | Status: DC | PRN

## 2021-06-14 NOTE — Progress Notes (Signed)
Amanda [name] [id number] [birth year] 61 y.o.  Virtual Visit via Video Note  I connected with pt @ on 06/14/21 at  9:00 AM EDT by a video enabled telemedicine application and verified that I am speaking with the correct person using two identifiers.   I discussed the limitations of evaluation and management by telemedicine and the availability of in person appointments. The patient expressed understanding and agreed to proceed.  I discussed the assessment and treatment plan with the patient. The patient was provided an opportunity to ask questions and all were answered. The patient agreed with the plan and demonstrated an understanding of the instructions.   The patient was advised to call back or seek an in-person evaluation if the symptoms worsen or if the condition fails to improve as anticipated.  I provided 30 minutes of non-face-to-face time during this encounter.  The patient was located at home.  The provider was located at home.   Thayer Headings, PMHNP   Subjective:   Patient ID:  Amanda [name] is a 61 y.o. (DOB 11-Sep-1960) female.  Chief Complaint:  Chief Complaint  Patient presents with   Depression   Anxiety   Follow-up    Insomnia    HPI Amanda [name] presents for follow-up of Depression, Anxiety, and insomnia. She reports that she has been "pretty depressed." She reports that she has not been going to work. She reports that she has been staying in bed most of the time and having crying episodes. She reports a very slight improvement in depression over the last 1-2 days and has not had uncontrolled crying. She reports that she sleeps well with current medications. She reports anxiety in the form of worry. Worries about the future. She will clinch her teeth and have chills. Low energy and low motivation. Appetite has been low. Will snack periodically and does not feel like cooking. "I don't enjoy anything. Nothing interests me." Denies SI. She reports that she has been  worrying "what's my purpose?"  Started Latuda 20 mg on 06/08/21. She reports that she has been crying less and notices a partial improvement. Denies side effects. Cymbalta increased to 60 mg on 05/31/21.  She reports that she is occasionally needing to take Xanax 1 mg BID some days.   She reports that she took [place] for a week and felt "off-balance" and "weird" without any improvement in mood.   She reports that depression has been worse since she was hospitalized.   Trying to walk a mile a day with wife's encouragement. She reports that sometimes she will have uncontrolled crying during their walks.   Daughter recently started driving and she has been worrying about her daughter. She reports that her daughter has not been home as much since she started driving and is thinking more about daughter growing up and leaving home.   Past Psychiatric Medication Trials: Wellbutrin XL- No improvement at 300 mg dose. Trintellix Lexapro- "felt the best on." Reports that she had some hair loss Zoloft Celexa Viibryd Prozac Effexor Remeron Ambien-Effective and then no longer effective. Sleep was not restful. Parasomnias. Lunesta-Ineffective Belsomra-Ineffective Trazodone-ineffective.  Doxepin-Ineffective Klonopin xanax Gabapentin Abilify Rexulti- Akathisia. No benefit [place]  Review of Systems:  Review of Systems  Gastrointestinal:  Negative for nausea.  Musculoskeletal:  Negative for gait problem.  Neurological:  Negative for dizziness, tremors and light-headedness.  Psychiatric/Behavioral:         Please refer to HPI   Medications: I have reviewed the patient's current medications.  Current Outpatient Medications  Medication Sig Dispense Refill   ALPRAZolam (XANAX) 1 MG tablet TAKE 1 TABLET AT BEDTIME AND 1/2-1 TABLET AS NEEDED FOR ANXIETY. 45 tablet 5   carvedilol (COREG) 12.5 MG tablet Take 1 tablet (12.5 mg total) by mouth 2 (two) times daily. 60 tablet 5   Cholecalciferol  (VITAMIN D3) 50 MCG (2000 UT) capsule Take 1 tablet by mouth every morning.     Cyanocobalamin (VITAMIN B-12 PO) Take 1 tablet by mouth every morning.     DULoxetine (CYMBALTA) 60 MG capsule Take 1 capsule (60 mg total) by mouth daily. 30 capsule 1   esomeprazole (NEXIUM) 20 MG capsule Take 20 mg by mouth every morning.     lurasidone (LATUDA) 40 MG TABS tablet Take 1/2 tablet daily with supper for one week, then increase to 1 tablet daily with supper 30 tablet 1   REPATHA SURECLICK 295 MG/ML SOAJ INJECT 1 PEN INTO THE SKIN EVERY 14 DAYS (Patient taking differently: Inject 140 mg into the skin every 14 (fourteen) days. Every other Monday) 2 mL 11   valsartan (DIOVAN) 80 MG tablet Take 1 tablet (80 mg total) by mouth daily. 30 tablet 5   lidocaine (LIDODERM) 5 % Place 1 patch onto the skin daily. Remove & Discard patch within 12 hours or as directed by MD (Patient not taking: Reported on 05/17/2021) 30 patch 0   rizatriptan (MAXALT) 10 MG tablet Take 10 mg by mouth See admin instructions. Take one tablet (10 mg) by mouth at onset of migraine headache, may repeat in 2 hours if still needed.     [START ON 07/12/2021] zaleplon (SONATA) 10 MG capsule Take 1 capsule (10 mg total) by mouth at bedtime as needed for sleep. 30 capsule 2   No current facility-administered medications for this visit.    Medication Side Effects: None  Allergies:  Allergies  Allergen Reactions   Ace Inhibitors Cough   Ambien [Zolpidem] Other (See Comments)    Other reaction(s): headache/neck throbbing   Amoxicillin Other (See Comments)    Gi upset   Codeine Nausea And Vomiting    Can tolerate hydrocodone if has food on stomach     Cortisone Other (See Comments)    Extremely high BP- reaction to injection   Felodipine Other (See Comments)     increased flushing and made her feel bad   Hydralazine     nausea   Hydrocodone-Acetaminophen Nausea And Vomiting    Must eat before and after each dose   Lisinopril Cough     Dry cough   Losartan Potassium Cough   Nexletol [Bempedoic Acid] Other (See Comments)    Brain fog   Venlafaxine Other (See Comments)    headaches/elevated blood pressure   Zetia [Ezetimibe] Diarrhea   Crestor [Rosuvastatin] Rash    facial rash   Lamictal [Lamotrigine] Rash   Statins Rash    Face and leg rash with rosuvastatin, pravastatin, and atorvastatin    Past Medical History:  Diagnosis Date   Anxiety    Asthma    Chronic kidney disease    Chronic kidney disease, stage 3 ([place]) 08/10/2019   Depression    Hyperlipidemia    Hypertension    Migraine     Family History  Problem Relation Age of Onset   Elevated Lipids Mother    Asthma Father    Heart disease Father        CAD s/p CABG   Elevated Lipids Father    Depression  Father    Asthma Sister    Elevated Lipids Sister    Hypertension Sister    Depression Sister    Elevated Lipids Sister    Hypertension Sister    Bipolar disorder Sister    Elevated Lipids Sister    Hypertension Sister    Depression Sister    Asthma Paternal Aunt    Breast cancer Neg Hx     Social History   Socioeconomic History   Marital status: Married    Spouse name: Not on file   Number of children: 1   Years of education: Not on file   Highest education level: Not on file  Occupational History   Occupation: Aeronautical engineer: [employer]  Tobacco Use   Smoking status: Former    Packs/day: 0.50    Years: 5.00    Pack years: 2.50    Types: Cigarettes    Quit date: 01/29/1987    Years since quitting: 34.3   Smokeless tobacco: Never  Vaping Use   Vaping Use: Never used  Substance and Sexual Activity   Alcohol use: Yes    Comment: social ETOH only   Drug use: No   Sexual activity: Not on file  Other Topics Concern   Not on file  Social History Narrative   Not on file   Social Determinants of Health   Financial Resource Strain: Not on file  Food Insecurity: Not on file  Transportation Needs: Not on file   Physical Activity: Not on file  Stress: Not on file  Social Connections: Not on file  Intimate Partner Violence: Not on file    Past Medical History, Surgical history, Social history, and Family history were reviewed and updated as appropriate.   Please see review of systems for further details on the patient's review from today.   Objective:   Physical Exam:  LMP 03/23/2011   Physical Exam Neurological:     Mental Status: She is alert and oriented to person, place, and time.     Cranial Nerves: No dysarthria.  Psychiatric:        Attention and Perception: Attention and perception normal.        Mood and Affect: Mood is anxious and depressed.        Speech: Speech normal.        Behavior: Behavior is cooperative.        Thought Content: Thought content normal. Thought content is not paranoid or delusional. Thought content does not include homicidal or suicidal ideation. Thought content does not include homicidal or suicidal plan.        Cognition and Memory: Cognition and memory normal.        Judgment: Judgment normal.     Comments: Insight intact    Lab Review:     Component Value Date/Time   NA 138 04/30/2021 1234   NA 138 11/03/2019 1107   K 3.8 04/30/2021 1234   CL 106 04/30/2021 1234   CO2 23 04/30/2021 1234   GLUCOSE 119 (H) 04/30/2021 1234   BUN 15 04/30/2021 1234   BUN 17 11/03/2019 1107   CREATININE 0.92 04/30/2021 1234   CALCIUM 9.7 04/30/2021 1234   PROT 7.6 04/30/2021 1234   PROT 6.6 07/03/2020 1128   ALBUMIN 4.2 04/30/2021 1234   ALBUMIN 3.9 07/03/2020 1128   AST 22 04/30/2021 1234   ALT 26 04/30/2021 1234   ALKPHOS 64 04/30/2021 1234   BILITOT 0.4 04/30/2021 1234   BILITOT 0.5 07/03/2020  1128   GFRNONAA >60 04/30/2021 1234   GFRAA 38 (L) 11/03/2019 1107       Component Value Date/Time   WBC 11.3 (H) 04/30/2021 1234   RBC 4.58 04/30/2021 1234   HGB 13.9 04/30/2021 1234   HCT 41.2 04/30/2021 1234   PLT 385 04/30/2021 1234   MCV 90.0  04/30/2021 1234   MCH 30.3 04/30/2021 1234   MCHC 33.7 04/30/2021 1234   RDW 12.5 04/30/2021 1234   LYMPHSABS 3.3 04/18/2021 0415   MONOABS 0.7 04/18/2021 0415   EOSABS 0.1 04/18/2021 0415   BASOSABS 0.1 04/18/2021 0415    No results found for: POCLITH, LITHIUM   No results found for: PHENYTOIN, PHENOBARB, VALPROATE, CBMZ   .res Assessment: Plan:   Pt seen for 30 minutes and time spent discussing response to initiation of Cymbalta and Latuda. Discussed that more time is needed to determine response to these medications since Latuda was started less than a week ago and she has been on Cymbalta 60 mg for about two weeks. Discussed that the timeline for both of these medications to start to have an effect are in the next 1-2 weeks.  Will continue current plan for the next 2 weeks since she reports some possible slight improvement in depression over the last 1-2 days and she is not having any side effects.  Continue Cymbalta 60 mg daily for anxiety and depression.  Continue Latuda 20 mg with plan to increase to 40 mg daily with evening meal on 05/16/21. Continue Sonata 10 mg po QHS for insomnia.  Continue Xanax 1 mg po QHS and 1/2-1 tab po qd prn anxiety.  Pt to follow-up in 2 weeks or sooner if clinically indicated.  Patient advised to contact office with any questions, adverse effects, or acute worsening in signs and symptoms.   Kiona was seen today for depression, anxiety and follow-up.  Diagnoses and all orders for this visit:  Depression, unspecified depression type  Chronic insomnia -     zaleplon (SONATA) 10 MG capsule; Take 1 capsule (10 mg total) by mouth at bedtime as needed for sleep.  Anxiety     Please see After Visit Summary for patient specific instructions.  Future Appointments  Date Time Provider [place]  06/28/2021  1:00 PM Thayer Headings, PMHNP [facility] None  07/10/2021  2:30 PM [id number]    No orders of the  defined types were placed in this encounter.     -------------------------------

## 2021-06-21 DIAGNOSIS — F4323 Adjustment disorder with mixed anxiety and depressed mood: Secondary | ICD-10-CM | POA: Diagnosis not present

## 2021-06-21 DIAGNOSIS — F4310 Post-traumatic stress disorder, unspecified: Secondary | ICD-10-CM | POA: Diagnosis not present

## 2021-06-27 DIAGNOSIS — F4310 Post-traumatic stress disorder, unspecified: Secondary | ICD-10-CM | POA: Diagnosis not present

## 2021-06-27 DIAGNOSIS — F4323 Adjustment disorder with mixed anxiety and depressed mood: Secondary | ICD-10-CM | POA: Diagnosis not present

## 2021-06-28 ENCOUNTER — Encounter: Payer: Self-pay | Admitting: Psychiatry

## 2021-06-28 ENCOUNTER — Telehealth (INDEPENDENT_AMBULATORY_CARE_PROVIDER_SITE_OTHER): Payer: BC Managed Care – PPO | Admitting: Psychiatry

## 2021-06-28 VITALS — BP 130/80 | HR 74

## 2021-06-28 DIAGNOSIS — F5104 Psychophysiologic insomnia: Secondary | ICD-10-CM

## 2021-06-28 DIAGNOSIS — F419 Anxiety disorder, unspecified: Secondary | ICD-10-CM

## 2021-06-28 DIAGNOSIS — F32A Depression, unspecified: Secondary | ICD-10-CM

## 2021-06-28 MED ORDER — DULOXETINE HCL 60 MG PO CPEP: 60.0000 mg | ORAL_CAPSULE | Freq: Every day | ORAL | 1 refills | Status: DC

## 2021-06-28 MED ORDER — DULOXETINE HCL 30 MG PO CPEP: 30.0000 mg | ORAL_CAPSULE | Freq: Every day | ORAL | 1 refills | Status: DC

## 2021-06-28 MED ORDER — LURASIDONE HCL 40 MG PO TABS: 40.0000 mg | ORAL_TABLET | Freq: Every day | ORAL | 1 refills | Status: DC

## 2021-06-28 NOTE — Progress Notes (Signed)
Amanda [name] [id number] 1961-01-17 61 y.o.  Virtual Visit via Video Note  I connected with pt @ on 06/28/21 at  1:00 PM EDT by a video enabled telemedicine application and verified that I am speaking with the correct person using two identifiers.   I discussed the limitations of evaluation and management by telemedicine and the availability of in person appointments. The patient expressed understanding and agreed to proceed.  I discussed the assessment and treatment plan with the patient. The patient was provided an opportunity to ask questions and all were answered. The patient agreed with the plan and demonstrated an understanding of the instructions.   The patient was advised to call back or seek an in-person evaluation if the symptoms worsen or if the condition fails to improve as anticipated.  I provided 30 minutes of non-face-to-face time during this encounter.  The patient was located at home.  The provider was located at home.   Thayer Headings, PMHNP   Subjective:   Patient ID:  Amanda [name] is a 61 y.o. (DOB September 29, 1960) female.  Chief Complaint:  Chief Complaint  Patient presents with   Depression    HPI Amanda [name] presents for follow-up of depression, anxiety, and insomnia. She reports that she is feeling a "little better." She reports that she has started seeing a therapist, Sharen Hones with [facility]. Pt reports that she feels like she is "about 40% better."  "Still having some bad days." She has had a couple of days where she has not wanted to get out of bed or not until mid-day. Felt like she could cry yesterday but did not cry. She reports that she has not had any recent crying episodes. She reports that mood is sad most of the time and some days it is more intense than others. Some frustration with herself. She reports that anxiety has been "a little better. The anxiety has improved more than the depression."  Denies recent clinching of teeth during the  day. She reports limited interest in things. Continues to walk daily. Energy and motivation have been low. Has started cooking occasionally again. She reports that she went to the office one day and has worked some from home. Some possible improvement in concentration. Now able to start journaling and write down her thoughts. Sleeping well. Denies sleeping excessively. Appetite has been increased and returned to her baseline. She reports that she has been questioning, "What's my purpose? Where do I go from here?" Denies SI.  She reports that she has used Xanax prn infrequently.   Went to [place] over the weekend to visit family. She reports that she enjoyed visit.   Past Psychiatric Medication Trials: Wellbutrin XL- No improvement at 300 mg dose. Trintellix Lexapro- "felt the best on." Reports that she had some hair loss Zoloft Celexa Viibryd Prozac Effexor Remeron Ambien-Effective and then no longer effective. Sleep was not restful. Parasomnias. Lunesta-Ineffective Belsomra-Ineffective Trazodone-ineffective.  Doxepin-Ineffective Klonopin xanax Gabapentin Abilify Rexulti- Akathisia. No benefit De[place]in   Review of Systems:  Review of Systems  Musculoskeletal:  Negative for gait problem.  Neurological:  Negative for tremors.  Psychiatric/Behavioral:         Please refer to HPI   Medications: I have reviewed the patient's current medications.  Current Outpatient Medications  Medication Sig Dispense Refill   ALPRAZolam (XANAX) 1 MG tablet TAKE 1 TABLET AT BEDTIME AND 1/2-1 TABLET AS NEEDED FOR ANXIETY. 45 tablet 5   carvedilol (COREG) 12.5 MG tablet Take 1 tablet (12.5  mg total) by mouth 2 (two) times daily. 60 tablet 5   Cholecalciferol (VITAMIN D3) 50 MCG (2000 UT) capsule Take 1 tablet by mouth every morning.     Cyanocobalamin (VITAMIN B-12 PO) Take 1 tablet by mouth every morning.     DULoxetine (CYMBALTA) 30 MG capsule Take 1 capsule (30 mg total) by mouth daily. Take  with a 60 mg capsule to equal total dose of 90 mg. 30 capsule 1   DULoxetine (CYMBALTA) 60 MG capsule Take 1 capsule (60 mg total) by mouth daily. Take with a 30 mg capsule to equal total dose of 90 mg. 30 capsule 1   esomeprazole (NEXIUM) 20 MG capsule Take 20 mg by mouth every morning.     REPATHA SURECLICK 540 MG/ML SOAJ INJECT 1 PEN INTO THE SKIN EVERY 14 DAYS (Patient taking differently: Inject 140 mg into the skin every 14 (fourteen) days. Every other Monday) 2 mL 11   valsartan (DIOVAN) 80 MG tablet Take 1 tablet (80 mg total) by mouth daily. 30 tablet 5   [START ON 07/12/2021] zaleplon (SONATA) 10 MG capsule Take 1 capsule (10 mg total) by mouth at bedtime as needed for sleep. 30 capsule 2   lidocaine (LIDODERM) 5 % Place 1 patch onto the skin daily. Remove & Discard patch within 12 hours or as directed by MD (Patient not taking: Reported on 05/17/2021) 30 patch 0   lurasidone (LATUDA) 40 MG TABS tablet Take 1 tablet (40 mg total) by mouth daily with supper. 30 tablet 1   rizatriptan (MAXALT) 10 MG tablet Take 10 mg by mouth See admin instructions. Take one tablet (10 mg) by mouth at onset of migraine headache, may repeat in 2 hours if still needed.     No current facility-administered medications for this visit.    Medication Side Effects: None  Allergies:  Allergies  Allergen Reactions   Ace Inhibitors Cough   Ambien [Zolpidem] Other (See Comments)    Other reaction(s): headache/neck throbbing   Amoxicillin Other (See Comments)    Gi upset   Codeine Nausea And Vomiting    Can tolerate hydrocodone if has food on stomach     Cortisone Other (See Comments)    Extremely high BP- reaction to injection   Felodipine Other (See Comments)     increased flushing and made her feel bad   Hydralazine     nausea   Hydrocodone-Acetaminophen Nausea And Vomiting    Must eat before and after each dose   Lisinopril Cough    Dry cough   Losartan Potassium Cough   Nexletol [Bempedoic Acid]  Other (See Comments)    Brain fog   Venlafaxine Other (See Comments)    headaches/elevated blood pressure   Zetia [Ezetimibe] Diarrhea   Crestor [Rosuvastatin] Rash    facial rash   Lamictal [Lamotrigine] Rash   Statins Rash    Face and leg rash with rosuvastatin, pravastatin, and atorvastatin    Past Medical History:  Diagnosis Date   Anxiety    Asthma    Chronic kidney disease    Chronic kidney disease, stage 3 ([place]) 08/10/2019   Depression    Hyperlipidemia    Hypertension    Migraine     Family History  Problem Relation Age of Onset   Elevated Lipids Mother    Asthma Father    Heart disease Father        CAD s/p CABG   Elevated Lipids Father    Depression Father  Asthma Sister    Elevated Lipids Sister    Hypertension Sister    Depression Sister    Elevated Lipids Sister    Hypertension Sister    Bipolar disorder Sister    Elevated Lipids Sister    Hypertension Sister    Depression Sister    Asthma Paternal Aunt    Breast cancer Neg Hx     Social History   Socioeconomic History   Marital status: Married    Spouse name: Not on file   Number of children: 1   Years of education: Not on file   Highest education level: Not on file  Occupational History   Occupation: Aeronautical engineer: [employer]  Tobacco Use   Smoking status: Former    Packs/day: 0.50    Years: 5.00    Pack years: 2.50    Types: Cigarettes    Quit date: 01/29/1987    Years since quitting: 34.4   Smokeless tobacco: Never  Vaping Use   Vaping Use: Never used  Substance and Sexual Activity   Alcohol use: Yes    Comment: social ETOH only   Drug use: No   Sexual activity: Not on file  Other Topics Concern   Not on file  Social History Narrative   Not on file   Social Determinants of Health   Financial Resource Strain: Not on file  Food Insecurity: Not on file  Transportation Needs: Not on file  Physical Activity: Not on file  Stress: Not on file  Social  Connections: Not on file  Intimate Partner Violence: Not on file    Past Medical History, Surgical history, Social history, and Family history were reviewed and updated as appropriate.   Please see review of systems for further details on the patient's review from today.   Objective:   Physical Exam:  BP 130/80   Pulse 74   LMP 03/23/2011   Physical Exam Neurological:     Mental Status: She is alert and oriented to person, place, and time.     Cranial Nerves: No dysarthria.  Psychiatric:        Attention and Perception: Attention and perception normal.        Speech: Speech normal.        Behavior: Behavior is cooperative.        Thought Content: Thought content normal. Thought content is not paranoid or delusional. Thought content does not include homicidal or suicidal ideation. Thought content does not include homicidal or suicidal plan.        Cognition and Memory: Cognition and memory normal.        Judgment: Judgment normal.     Comments: Insight intact Mood presents as less depressed and less anxious    Lab Review:     Component Value Date/Time   NA 138 04/30/2021 1234   NA 138 11/03/2019 1107   K 3.8 04/30/2021 1234   CL 106 04/30/2021 1234   CO2 23 04/30/2021 1234   GLUCOSE 119 (H) 04/30/2021 1234   BUN 15 04/30/2021 1234   BUN 17 11/03/2019 1107   CREATININE 0.92 04/30/2021 1234   CALCIUM 9.7 04/30/2021 1234   PROT 7.6 04/30/2021 1234   PROT 6.6 07/03/2020 1128   ALBUMIN 4.2 04/30/2021 1234   ALBUMIN 3.9 07/03/2020 1128   AST 22 04/30/2021 1234   ALT 26 04/30/2021 1234   ALKPHOS 64 04/30/2021 1234   BILITOT 0.4 04/30/2021 1234   BILITOT 0.5 07/03/2020 1128  GFRNONAA >60 04/30/2021 1234   GFRAA 38 (L) 11/03/2019 1107       Component Value Date/Time   WBC 11.3 (H) 04/30/2021 1234   RBC 4.58 04/30/2021 1234   HGB 13.9 04/30/2021 1234   HCT 41.2 04/30/2021 1234   PLT 385 04/30/2021 1234   MCV 90.0 04/30/2021 1234   MCH 30.3 04/30/2021 1234   MCHC  33.7 04/30/2021 1234   RDW 12.5 04/30/2021 1234   LYMPHSABS 3.3 04/18/2021 0415   MONOABS 0.7 04/18/2021 0415   EOSABS 0.1 04/18/2021 0415   BASOSABS 0.1 04/18/2021 0415    No results found for: POCLITH, LITHIUM   No results found for: PHENYTOIN, PHENOBARB, VALPROATE, CBMZ   .res Assessment: Plan:   Pt seen for 30 minutes and time spent discussing treatment options for depression and anxiety. Discussed potential benefits, risks, and side effects of increasing Duloxetine to 90 mg daily. Dicsussed that this dose is off-label. Pt agrees to increase in Cymbalta to 90 mg po qd.  Will continue Latuda 40 mg po qd since she reports some improvement in sleep, anxiety, and mood since starting Latuda.  Continue Alprazolam 1 mg at bedtime and 1/2-1 tablet as needed for anxiety.  Continue Sonata 10 mg po QHS prn insomnia.  Pt to follow-up with this provider in 4 weeks or sooner if clinically indicated.  Patient advised to contact office with any questions, adverse effects, or acute worsening in signs and symptoms.   Marelyn was seen today for depression.  Diagnoses and all orders for this visit:  Anxiety -     DULoxetine (CYMBALTA) 30 MG capsule; Take 1 capsule (30 mg total) by mouth daily. Take with a 60 mg capsule to equal total dose of 90 mg. -     DULoxetine (CYMBALTA) 60 MG capsule; Take 1 capsule (60 mg total) by mouth daily. Take with a 30 mg capsule to equal total dose of 90 mg.  Depression, unspecified depression type -     DULoxetine (CYMBALTA) 30 MG capsule; Take 1 capsule (30 mg total) by mouth daily. Take with a 60 mg capsule to equal total dose of 90 mg. -     DULoxetine (CYMBALTA) 60 MG capsule; Take 1 capsule (60 mg total) by mouth daily. Take with a 30 mg capsule to equal total dose of 90 mg. -     lurasidone (LATUDA) 40 MG TABS tablet; Take 1 tablet (40 mg total) by mouth daily with supper.  Chronic insomnia -     DULoxetine (CYMBALTA) 60 MG capsule; Take 1 capsule (60 mg  total) by mouth daily. Take with a 30 mg capsule to equal total dose of 90 mg.     Please see After Visit Summary for patient specific instructions.  Future Appointments  Date Time Provider [place]  07/10/2021  2:30 PM [id number]    No orders of the defined types were placed in this encounter.     -------------------------------

## 2021-07-04 DIAGNOSIS — F4323 Adjustment disorder with mixed anxiety and depressed mood: Secondary | ICD-10-CM | POA: Diagnosis not present

## 2021-07-04 DIAGNOSIS — F4310 Post-traumatic stress disorder, unspecified: Secondary | ICD-10-CM | POA: Diagnosis not present

## 2021-07-09 NOTE — Progress Notes (Signed)
Patient ID: Amanda [name]                 DOB: [birth year]                      MRN: [id number]     HPI: Amanda [name] is a 61 y.o. female referred by Dr. Angelena Form to HTN clinic. PMH is significant for HTN, HLD, palpitations (PACs/PVCs), CKD3, asthma, GERD, and anxiety/depression. Admitted 3/20-3/22/23 following syncopal episode likely from dehydration (hyponatremia Na 132, hypokalemia K 3.4) with mild worsening of renal function. Stopped HCTZ and started hydralazine for BP control after admission. At time of syncopal episode in March, pt was doing [place] diet (drinking 80 oz water/day, using diet protein packets, cooking one lean and green meal/day). After discharge Dr. Angelena Form increased metoprolol to 50 mg BID and referred to HTN clinic after patient sent a message that her BP was still elevated to 160/86 after this change. BP was 124/64 when last seen by HTN clinic 06/12/21 but patient reported nausea and TID dosing with hydralazine was inhibiting QoL and requested change. Hydralazine was discontinued and valsartan initiated after clarifying that patient has only ever experienced dry cough (no swelling) with ACEi and patient had no memory of trying losartan in the past. Metoprolol was also switched to equivalent dose of carvedilol.   Today, patient arrives in good spirits. Reports that the pain in her side side and nausea resolved after stopping hydralazine. She has had no issues thus far since starting valsartan or switching metoprolol to carvedilol but feels BP not as controlled. She wonders if this could be related to a recent increase in Cymbalta dose. Denies dizziness, lightheadedness, LEE, HA, or blurred vision.   She brings her home cuff today for validation: Home cuff: 155/99, HR 82 Clinic cuff: 136/90, HR 83  Current HTN meds: valsartan 80 mg daily, carvedilol 12.5 mg BID Previously tried: lisinopril (cough), losartan (cough), felodipine (increased flushing, malaise), HCTZ (syncope,  worsening Scr that improved with discontinuation), metoprolol (switched to carvedilol for better BP lowering), hydralazine (nausea, abdominal pain) BP goal: <130/80 mmHg  Family History: HLD in mother/father/sister, CAD s/p CABG in father, HTN in sister  Social History: Former smoker (2.5 pack year history, quit 1989). Drinks alcohol occasionally in social settings. Denies drug use.   Diet: Tries not to add salt to foods. Drinks 1-2 cokes per day. Tries to avoid fried foods. Eats a lot of chicken, fish.   Exercise: Walking 1 mile/day  Home BP readings: Uses 61 year old upper arm Omron cuff - not accurate as validated above. Recent readings: 135/81, 138/79, 131/72, 139/80, 130/83. Increased Cymbalta dose on 6/1 and readings after that: 160/91, 120/77, 142/84, 156/91, 124/81, 141/87, 132/81  Wt Readings from Last 3 Encounters:  04/30/21 150 lb (68 kg)  04/27/21 151 lb (68.5 kg)  04/18/21 148 lb 2.4 oz (67.2 kg)   BP Readings from Last 3 Encounters:  06/12/21 124/64  04/30/21 (!) 141/76  04/27/21 130/72   Pulse Readings from Last 3 Encounters:  04/30/21 64  04/27/21 76  04/18/21 78   Renal function: CrCl cannot be calculated (Patient's most recent lab result is older than the maximum 21 days allowed.).  Past Medical History:  Diagnosis Date   Anxiety    Asthma    Chronic kidney disease    Chronic kidney disease, stage 3 ([place]) 08/10/2019   Depression    Hyperlipidemia    Hypertension    Migraine  Current Outpatient Medications on File Prior to Visit  Medication Sig Dispense Refill   ALPRAZolam (XANAX) 1 MG tablet TAKE 1 TABLET AT BEDTIME AND 1/2-1 TABLET AS NEEDED FOR ANXIETY. 45 tablet 5   carvedilol (COREG) 12.5 MG tablet Take 1 tablet (12.5 mg total) by mouth 2 (two) times daily. 60 tablet 5   Cholecalciferol (VITAMIN D3) 50 MCG (2000 UT) capsule Take 1 tablet by mouth every morning.     Cyanocobalamin (VITAMIN B-12 PO) Take 1 tablet by mouth every morning.      DULoxetine (CYMBALTA) 30 MG capsule Take 1 capsule (30 mg total) by mouth daily. Take with a 60 mg capsule to equal total dose of 90 mg. 30 capsule 1   DULoxetine (CYMBALTA) 60 MG capsule Take 1 capsule (60 mg total) by mouth daily. Take with a 30 mg capsule to equal total dose of 90 mg. 30 capsule 1   esomeprazole (NEXIUM) 20 MG capsule Take 20 mg by mouth every morning.     lidocaine (LIDODERM) 5 % Place 1 patch onto the skin daily. Remove & Discard patch within 12 hours or as directed by MD (Patient not taking: Reported on 05/17/2021) 30 patch 0   lurasidone (LATUDA) 40 MG TABS tablet Take 1 tablet (40 mg total) by mouth daily with supper. 30 tablet 1   REPATHA SURECLICK 716 MG/ML SOAJ INJECT 1 PEN INTO THE SKIN EVERY 14 DAYS (Patient taking differently: Inject 140 mg into the skin every 14 (fourteen) days. Every other Monday) 2 mL 11   rizatriptan (MAXALT) 10 MG tablet Take 10 mg by mouth See admin instructions. Take one tablet (10 mg) by mouth at onset of migraine headache, may repeat in 2 hours if still needed.     valsartan (DIOVAN) 80 MG tablet Take 1 tablet (80 mg total) by mouth daily. 30 tablet 5   [START ON 07/12/2021] zaleplon (SONATA) 10 MG capsule Take 1 capsule (10 mg total) by mouth at bedtime as needed for sleep. 30 capsule 2   No current facility-administered medications on file prior to visit.   Allergies  Allergen Reactions   Ace Inhibitors Cough   Ambien [Zolpidem] Other (See Comments)    Other reaction(s): headache/neck throbbing   Amoxicillin Other (See Comments)    Gi upset   Codeine Nausea And Vomiting    Can tolerate hydrocodone if has food on stomach     Cortisone Other (See Comments)    Extremely high BP- reaction to injection   Felodipine Other (See Comments)     increased flushing and made her feel bad   Hydralazine     nausea   Hydrocodone-Acetaminophen Nausea And Vomiting    Must eat before and after each dose   Lisinopril Cough    Dry cough   Losartan  Potassium Cough   Nexletol [Bempedoic Acid] Other (See Comments)    Brain fog   Venlafaxine Other (See Comments)    headaches/elevated blood pressure   Zetia [Ezetimibe] Diarrhea   Crestor [Rosuvastatin] Rash    facial rash   Lamictal [Lamotrigine] Rash   Statins Rash    Face and leg rash with rosuvastatin, pravastatin, and atorvastatin   Assessment/Plan:  1. Hypertension - BP of 136/90 is not at goal <130/80 mmHg. She is tolerating valsartan and carvedilol with no adverse effects. Cymbalta is an SNRI so a dose increase could increase BP due to its effect on norepinephrine. However, we have adjusted BP medications lately trying to find a HTN regimen  that she tolerates well and brings BP to goal, so it is unclear how big of an effect the recent SNRI dose increase is having vs still trying to determine an appropriate BP medication regimen. Either way, a medication change is warranted today to improve BP. Will check BMET today after starting valsartan and if stable, will plan to increase to 160 mg. For now, continue valsartan 80 mg daily and carvedilol 12.5 mg BID. I will call the patient tomorrow to discuss lab results and any medication changes at that time. Home BP cuff is not accurate, leading to unreliable home readings. I recommended the patient purchase a new upper arm cuff and bring to next visit for validation. Follow up visit scheduled for 4 weeks.   Rebbeca Paul, PharmD PGY2 Ambulatory Care Pharmacy Resident 07/10/2021 2:25 PM

## 2021-07-10 ENCOUNTER — Ambulatory Visit: Payer: BC Managed Care – PPO | Admitting: Student-PharmD

## 2021-07-10 VITALS — BP 136/90 | HR 83

## 2021-07-10 DIAGNOSIS — E782 Mixed hyperlipidemia: Secondary | ICD-10-CM

## 2021-07-10 DIAGNOSIS — I10 Essential (primary) hypertension: Secondary | ICD-10-CM | POA: Diagnosis not present

## 2021-07-10 NOTE — Patient Instructions (Addendum)
It was nice to see you today!  Your goal blood pressure is less than 130/80 mmHg. In clinic, your blood pressure was 136/90 mmHg.  Medication Changes:  Continue your current medications for now. I will call you with your lab results tomorrow and if everything is stable, we will plan to increase valsartan.   I recommend purchasing a new BP cuff. I recommend an Omron upper arm cuff. You can bring this to your next appointment so we can check the accuracy.   Monitor blood pressure at home daily and keep a log (on your phone or piece of paper) to bring with you to your next visit. Write down date, time, blood pressure and pulse.  Keep up the good work with diet and exercise. Aim for a diet full of vegetables, fruit and lean meats (chicken, [place], fish). Try to limit salt intake by eating fresh or frozen vegetables (instead of canned), rinse canned vegetables prior to cooking and do not add any additional salt to meals.  Next visit on Wednesday, July 12th.

## 2021-07-11 ENCOUNTER — Telehealth: Payer: Self-pay | Admitting: Student-PharmD

## 2021-07-11 LAB — BASIC METABOLIC PANEL
BUN/Creatinine Ratio: 15 (ref 12–28)
BUN: 17 mg/dL (ref 8–27)
CO2: 21 mmol/L (ref 20–29)
Calcium: 9.7 mg/dL (ref 8.7–10.3)
Chloride: 101 mmol/L (ref 96–106)
Creatinine, Ser: 1.15 mg/dL — ABNORMAL HIGH (ref 0.57–1.00)
Glucose: 86 mg/dL (ref 70–99)
Potassium: 4.4 mmol/L (ref 3.5–5.2)
Sodium: 138 mmol/L (ref 134–144)
eGFR: 54 mL/min/{1.73_m2} — ABNORMAL LOW (ref >59)

## 2021-07-11 MED ORDER — CARVEDILOL 25 MG PO TABS: 25.0000 mg | ORAL_TABLET | Freq: Two times a day (BID) | ORAL | 5 refills | Status: DC

## 2021-07-11 NOTE — Telephone Encounter (Signed)
Patient returned call. Confirmed understanding of reviewed lab results. She is agreeable to increasing carvedilol. Counseled patient that she can take two of her current 12.5 mg tablets (to equal 25 mg) twice daily. Then when she runs out of that she will switch to using one 25 mg tablet twice daily, which has been sent in to her pharmacy. She confirms understanding of this change.

## 2021-07-11 NOTE — Telephone Encounter (Addendum)
Repeated BMET after starting valsartan at last HTN clinic visit. Electrolytes are wnl. Scr increased 25% from previous/baseline. Given that this is <30% of an increase, will continue current dose of valsartan but will not increase at this time as tentatively discussed as an option at visit yesterday given BP not at goal. Will increase carvedilol to 25 mg BID instead. Will repeat BMET at next visit.   Called patient to review lab results and discuss carvedilol dose increase but was unable to reach. LVM requesting call back.

## 2021-07-18 DIAGNOSIS — F332 Major depressive disorder, recurrent severe without psychotic features: Secondary | ICD-10-CM | POA: Diagnosis not present

## 2021-07-25 DIAGNOSIS — F332 Major depressive disorder, recurrent severe without psychotic features: Secondary | ICD-10-CM | POA: Diagnosis not present

## 2021-07-26 DIAGNOSIS — F332 Major depressive disorder, recurrent severe without psychotic features: Secondary | ICD-10-CM | POA: Diagnosis not present

## 2021-07-27 DIAGNOSIS — F332 Major depressive disorder, recurrent severe without psychotic features: Secondary | ICD-10-CM | POA: Diagnosis not present

## 2021-07-30 DIAGNOSIS — F332 Major depressive disorder, recurrent severe without psychotic features: Secondary | ICD-10-CM | POA: Diagnosis not present

## 2021-08-02 DIAGNOSIS — F332 Major depressive disorder, recurrent severe without psychotic features: Secondary | ICD-10-CM | POA: Diagnosis not present

## 2021-08-03 DIAGNOSIS — F332 Major depressive disorder, recurrent severe without psychotic features: Secondary | ICD-10-CM | POA: Diagnosis not present

## 2021-08-06 DIAGNOSIS — F332 Major depressive disorder, recurrent severe without psychotic features: Secondary | ICD-10-CM | POA: Diagnosis not present

## 2021-08-07 ENCOUNTER — Encounter: Payer: Self-pay | Admitting: Psychiatry

## 2021-08-07 ENCOUNTER — Ambulatory Visit (INDEPENDENT_AMBULATORY_CARE_PROVIDER_SITE_OTHER): Payer: BC Managed Care – PPO | Admitting: Psychiatry

## 2021-08-07 DIAGNOSIS — F32A Depression, unspecified: Secondary | ICD-10-CM | POA: Diagnosis not present

## 2021-08-07 DIAGNOSIS — F332 Major depressive disorder, recurrent severe without psychotic features: Secondary | ICD-10-CM | POA: Diagnosis not present

## 2021-08-07 DIAGNOSIS — F419 Anxiety disorder, unspecified: Secondary | ICD-10-CM | POA: Diagnosis not present

## 2021-08-07 DIAGNOSIS — F5104 Psychophysiologic insomnia: Secondary | ICD-10-CM

## 2021-08-07 MED ORDER — DULOXETINE HCL 60 MG PO CPEP: 60.0000 mg | ORAL_CAPSULE | Freq: Every day | ORAL | 0 refills | Status: DC

## 2021-08-07 MED ORDER — LURASIDONE HCL 20 MG PO TABS: ORAL_TABLET | ORAL | 0 refills | Status: DC

## 2021-08-07 MED ORDER — DULOXETINE HCL 30 MG PO CPEP: 30.0000 mg | ORAL_CAPSULE | Freq: Every day | ORAL | 0 refills | Status: DC

## 2021-08-07 NOTE — Progress Notes (Signed)
Amanda [name] [id number] 21-Apr-1960 61 y.o.  Subjective:   Patient ID:  Amanda [name] is a 61 y.o. (DOB 03-01-1960) female.  Chief Complaint:  Chief Complaint  Patient presents with   Medication Problem   Depression    HPI [name] presents to the office today for follow-up of depression and anxiety. She noticed some improvement in mood about 2 weeks after increasing Cymbalta to 90 mg. "I'm not as depressed." She reports that she started [place] treatments about 2 weeks ago. "I don't notice any change yet" and that she noticed improvement in depressive s/s prior to starting [place].    She reports, "I feel agitated and edgy." She reports feeling restless and like she has to move around. She reports that she has been irritable and impatient, which is not typical for her. She reports that she has nausea for an hour after taking Latuda. She has also been clinching her teeth.   She reports some occasional anxiety "but it's not strong." Reports less worry and rumination. Has some anxiety with upcoming trip to [place] to take daughter to field hockey camp and then with church group. Sleeping well with Xanax and Sonata. Energy is low. Motivation has also been low. Enjoying some Netflix series. Walked with wife last night and has otherwise not been walking. Reports gaining weight and craving "junk food," ie. Sweets and carbs that she normally does not enjoy. She reports concentrating better at times and has been working some. Worked all day yesterday and is getting out more. Denies SI.   Went on trip to [place] last weekend to visit nephew.   Past Psychiatric Medication Trials: Wellbutrin XL- No improvement at 300 mg dose. Trintellix Lexapro- "felt the best on." Reports that she had some hair loss Zoloft Celexa Viibryd Prozac Effexor Remeron Ambien-Effective and then no longer effective. Sleep was not restful. Parasomnias. Lunesta-Ineffective Belsomra-Ineffective Trazodone-ineffective.   Doxepin-Ineffective Klonopin xanax Gabapentin Abilify Rexulti- Akathisia. No benefit Latuda- Akathisia, clinching teeth, nausea, food cravings [place]    Flowsheet Row ED from 04/30/2021 in [facility] ED to Hosp-Admission (Discharged) from 04/16/2021 in [facility] to Hosp-Admission (Discharged) from 03/21/2020 in [facility]  C-SSRS RISK CATEGORY No Risk No Risk No Risk        Review of Systems:  Review of Systems  Cardiovascular:        Increased BP  Musculoskeletal:  Negative for gait problem.  Neurological:        Restlessness, one episode of arm jerking  Psychiatric/Behavioral:         Please refer to HPI    Medications: I have reviewed the patient's current medications.  Current Outpatient Medications  Medication Sig Dispense Refill   ALPRAZolam (XANAX) 1 MG tablet TAKE 1 TABLET AT BEDTIME AND 1/2-1 TABLET AS NEEDED FOR ANXIETY. 45 tablet 5   carvedilol (COREG) 25 MG tablet Take 1 tablet (25 mg total) by mouth 2 (two) times daily with a meal. 60 tablet 5   Cholecalciferol (VITAMIN D3) 50 MCG (2000 UT) capsule Take 1 tablet by mouth every morning.     Cyanocobalamin (VITAMIN B-12 PO) Take 1 tablet by mouth every morning.     esomeprazole (NEXIUM) 20 MG capsule Take 20 mg by mouth as needed.     REPATHA SURECLICK 703 MG/ML SOAJ INJECT 1 PEN INTO THE SKIN EVERY 14 DAYS (Patient taking differently: Inject 140 mg into the skin every  14 (fourteen) days. Every other Monday) 2 mL 11   rizatriptan (MAXALT) 10 MG tablet Take 10 mg by mouth See admin instructions. Take one tablet (10 mg) by mouth at onset of migraine headache, may repeat in 2 hours if still needed.     valsartan (DIOVAN) 80 MG tablet Take 1 tablet (80 mg total) by mouth daily. 30 tablet 5   zaleplon (SONATA) 10 MG capsule Take 1 capsule (10 mg total) by mouth at bedtime as needed for sleep. 30 capsule 2   amLODipine  (NORVASC) 2.5 MG tablet Take 1 tablet (2.5 mg total) by mouth daily. 30 tablet 3   DULoxetine (CYMBALTA) 30 MG capsule Take 1 capsule (30 mg total) by mouth daily. Take with a 60 mg capsule to equal total dose of 90 mg. 90 capsule 0   DULoxetine (CYMBALTA) 60 MG capsule Take 1 capsule (60 mg total) by mouth daily. Take with a 30 mg capsule to equal total dose of 90 mg. 90 capsule 0   lurasidone (LATUDA) 20 MG TABS tablet Take daily with supper for 2-5 days, then stop. 5 tablet 0   No current facility-administered medications for this visit.    Medication Side Effects: Other: Akathisia, food cravings, nausea  Allergies:  Allergies  Allergen Reactions   Ace Inhibitors Cough   Ambien [Zolpidem] Other (See Comments)    Other reaction(s): headache/neck throbbing   Amoxicillin Other (See Comments)    Gi upset   Codeine Nausea And Vomiting    Can tolerate hydrocodone if has food on stomach     Cortisone Other (See Comments)    Extremely high BP- reaction to injection   Felodipine Other (See Comments)     increased flushing and made her feel bad   Hydralazine     nausea   Hydrocodone-Acetaminophen Nausea And Vomiting    Must eat before and after each dose   Lisinopril Cough    Dry cough   Losartan Potassium Cough   Nexletol [Bempedoic Acid] Other (See Comments)    Brain fog   Venlafaxine Other (See Comments)    headaches/elevated blood pressure   Zetia [Ezetimibe] Diarrhea   Crestor [Rosuvastatin] Rash    facial rash   Lamictal [Lamotrigine] Rash   Statins Rash    Face and leg rash with rosuvastatin, pravastatin, and atorvastatin    Past Medical History:  Diagnosis Date   Anxiety    Asthma    Chronic kidney disease    Chronic kidney disease, stage 3 ([place]) 08/10/2019   Depression    Hyperlipidemia    Hypertension    Migraine     Past Medical History, Surgical history, Social history, and Family history were reviewed and updated as appropriate.   Please see review of  systems for further details on the patient's review from today.   Objective:   Physical Exam:  LMP 03/23/2011   Physical Exam Constitutional:      General: She is not in acute distress. Musculoskeletal:        General: No deformity.  Neurological:     Mental Status: She is alert and oriented to person, place, and time.     Coordination: Coordination normal.  Psychiatric:        Attention and Perception: Attention and perception normal. She does not perceive auditory or visual hallucinations.        Mood and Affect: Mood is anxious and depressed. Affect is not labile, blunt, angry or inappropriate.  Speech: Speech normal.        Behavior: Behavior is cooperative.        Thought Content: Thought content normal. Thought content is not paranoid or delusional. Thought content does not include homicidal or suicidal ideation. Thought content does not include homicidal or suicidal plan.        Cognition and Memory: Cognition and memory normal.        Judgment: Judgment normal.     Comments: Insight intact Mood presents as less depressed Mood is mildly dysphoric     Lab Review:     Component Value Date/Time   NA 138 08/08/2021 1411   K 4.4 08/08/2021 1411   CL 101 08/08/2021 1411   CO2 23 08/08/2021 1411   GLUCOSE 96 08/08/2021 1411   GLUCOSE 119 (H) 04/30/2021 1234   BUN 13 08/08/2021 1411   CREATININE 1.18 (H) 08/08/2021 1411   CALCIUM 10.3 08/08/2021 1411   PROT 7.6 04/30/2021 1234   PROT 6.6 07/03/2020 1128   ALBUMIN 4.2 04/30/2021 1234   ALBUMIN 3.9 07/03/2020 1128   AST 22 04/30/2021 1234   ALT 26 04/30/2021 1234   ALKPHOS 64 04/30/2021 1234   BILITOT 0.4 04/30/2021 1234   BILITOT 0.5 07/03/2020 1128   GFRNONAA >60 04/30/2021 1234   GFRAA 38 (L) 11/03/2019 1107       Component Value Date/Time   WBC 11.3 (H) 04/30/2021 1234   RBC 4.58 04/30/2021 1234   HGB 13.9 04/30/2021 1234   HCT 41.2 04/30/2021 1234   PLT 385 04/30/2021 1234   MCV 90.0 04/30/2021  1234   MCH 30.3 04/30/2021 1234   MCHC 33.7 04/30/2021 1234   RDW 12.5 04/30/2021 1234   LYMPHSABS 3.3 04/18/2021 0415   MONOABS 0.7 04/18/2021 0415   EOSABS 0.1 04/18/2021 0415   BASOSABS 0.1 04/18/2021 0415    No results found for: "POCLITH", "LITHIUM"   No results found for: "PHENYTOIN", "PHENOBARB", "VALPROATE", "CBMZ"   .res Assessment: Plan:    Pt seen for 30 minutes and time spent discussing that the symptoms she is describing seem to be most consistent with akathisia, which is most likely from [place]. She also experienced akathisia in the past with Rexulti. Discussed that restlessness and internal agitation will likely resolve sooner than it did with Rexulti since Latuda has a much shorter half-life. Discussed decreasing Latuda to 20 mg daily for 2 days and then stopping. Discussed that akathisia would likely lessen in a couple of days and be significantly improved/resolved after 5-7 days. Discussed that benzodiazepines typically relieve akathisia and she may wish to take Alprazolam twice daily for the next few days until akathisia improves. Discussed that nausea and cravings for carbs will also likely improve or resolve after stopping Latuda.  Will continue Cymbalta 90 mg po qd for anxiety and depression since she has experienced some improvement in symptoms with increase to 90 mg daily. Continue Sonata 10 mg po QHS prn insomnia.  Recommend continuing [place] as directed by [facility] providers.  Pt to follow-up with this provider in 4 weeks or sooner if clinically indicated.  Patient advised to contact office with any questions, adverse effects, or acute worsening in signs and symptoms.   Charleen was seen today for medication problem and depression.  Diagnoses and all orders for this visit:  Depression, unspecified depression type -     lurasidone (LATUDA) 20 MG TABS tablet; Take daily with supper for 2-5 days, then stop. -     DULoxetine (CYMBALTA) 30  MG capsule; Take 1 capsule (30 mg  total) by mouth daily. Take with a 60 mg capsule to equal total dose of 90 mg. -     DULoxetine (CYMBALTA) 60 MG capsule; Take 1 capsule (60 mg total) by mouth daily. Take with a 30 mg capsule to equal total dose of 90 mg.  Anxiety -     DULoxetine (CYMBALTA) 30 MG capsule; Take 1 capsule (30 mg total) by mouth daily. Take with a 60 mg capsule to equal total dose of 90 mg. -     DULoxetine (CYMBALTA) 60 MG capsule; Take 1 capsule (60 mg total) by mouth daily. Take with a 30 mg capsule to equal total dose of 90 mg.  Chronic insomnia -     DULoxetine (CYMBALTA) 60 MG capsule; Take 1 capsule (60 mg total) by mouth daily. Take with a 30 mg capsule to equal total dose of 90 mg.     Please see After Visit Summary for patient specific instructions.  Future Appointments  Date Time Provider [place]  09/03/2021  1:30 PM [facility]  09/11/2021 11:00 AM Thayer Headings, PMHNP [facility] None    No orders of the defined types were placed in this encounter.   -------------------------------

## 2021-08-08 ENCOUNTER — Ambulatory Visit: Payer: BC Managed Care – PPO | Admitting: Pharmacist

## 2021-08-08 VITALS — BP 144/89 | HR 76

## 2021-08-08 DIAGNOSIS — E782 Mixed hyperlipidemia: Secondary | ICD-10-CM

## 2021-08-08 DIAGNOSIS — F332 Major depressive disorder, recurrent severe without psychotic features: Secondary | ICD-10-CM | POA: Diagnosis not present

## 2021-08-08 NOTE — Progress Notes (Signed)
Patient ID: Amanda [name]                 DOB: 05-17-1960                      MRN: [id number]     HPI: Amanda [name] is a 61 y.o. female referred by Dr. Angelena Form to HTN clinic. PMH is significant for HTN, HLD, palpitations (PACs/PVCs), CKD3, asthma, GERD, and anxiety/depression. Admitted 3/20-3/22/23 following syncopal episode likely from dehydration (hyponatremia Na 132, hypokalemia K 3.4) with mild worsening of renal function. Stopped HCTZ and started hydralazine for BP control after admission. At time of syncopal episode in March, pt was doing [place] diet (drinking 80 oz water/day, using diet protein packets, cooking one lean and green meal/day). After discharge Dr. Angelena Form increased metoprolol to 50 mg BID and referred to HTN clinic after patient sent a message that her BP was still elevated to 160/86 after this change. BP was 124/64 when seen by HTN clinic 06/12/21 but patient reported nausea and TID dosing with hydralazine was inhibiting QoL and requested change. Hydralazine was discontinued and valsartan initiated after clarifying that patient has only ever experienced dry cough (no swelling) with ACEi and patient had no memory of trying losartan in the past. Metoprolol was also switched to equivalent dose of carvedilol.   At last visit on 6/13 BP was 136/90. Carvedilol was increased to '25mg'$  twice a day. Valsartan dose was not increased due to a increase in scr.   Patient presents today for follow up. She reports that she feels fatigued, lack of energy. Has only walked once this week because she feels tired. She is being weaned off of Latuda due to nausea, fidgeting and weight again. She did buy a new blood pressure cuff. Unfortunately when she demonstrated how she uses her blood pressure cuff, she was placing the cuff in the wrong position. She was placing over the elbow. She is upset that the medication is making her feel poorly and her blood pressure is still not controlled. Denies OB,  swelling, chest pain or dizziness (1 episode when she stood up too fast).   155/100 home cuff 144/95 home cuff 150/95 clinic cuff 144/89 clinic cuff 149/98 home cuff  Current HTN meds: valsartan 80 mg daily, carvedilol 25 mg BID Previously tried: lisinopril (cough), losartan (cough), felodipine (increased flushing, malaise), HCTZ (syncope, worsening Scr that improved with discontinuation), metoprolol (switched to carvedilol for better BP lowering), hydralazine (nausea, abdominal pain) BP goal: <130/80 mmHg  Family History: HLD in mother/father/sister, CAD s/p CABG in father, HTN in sister  Social History: Former smoker (2.5 pack year history, quit 1989). Drinks alcohol occasionally in social settings. Denies drug use.   Diet: Tries not to add salt to foods. Drinks 1-2 cokes per day. Tries to avoid fried foods. Eats a lot of chicken, fish.   Exercise: Walking 1 mile sometimes, recently doesn't have the energy for it  Home BP readings: 145/90, 145/91, 140/65, 134/84, 130/86, 132/83, 118/77, 141/86, 138/83, 134/85, 116/79, 122/67, 130/77, 143/88 HR high 70's  Wt Readings from Last 3 Encounters:  04/30/21 150 lb (68 kg)  04/27/21 151 lb (68.5 kg)  04/18/21 148 lb 2.4 oz (67.2 kg)   BP Readings from Last 3 Encounters:  07/10/21 136/90  06/12/21 124/64  04/30/21 (!) 141/76   Pulse Readings from Last 3 Encounters:  07/10/21 83  04/30/21 64  04/27/21 76   Renal function: CrCl cannot be calculated (Patient's most recent  lab result is older than the maximum 21 days allowed.).  Past Medical History:  Diagnosis Date   Anxiety    Asthma    Chronic kidney disease    Chronic kidney disease, stage 3 ([place]) 08/10/2019   Depression    Hyperlipidemia    Hypertension    Migraine    Current Outpatient Medications on File Prior to Visit  Medication Sig Dispense Refill   ALPRAZolam (XANAX) 1 MG tablet TAKE 1 TABLET AT BEDTIME AND 1/2-1 TABLET AS NEEDED FOR ANXIETY. 45 tablet 5    carvedilol (COREG) 25 MG tablet Take 1 tablet (25 mg total) by mouth 2 (two) times daily with a meal. 60 tablet 5   Cholecalciferol (VITAMIN D3) 50 MCG (2000 UT) capsule Take 1 tablet by mouth every morning.     Cyanocobalamin (VITAMIN B-12 PO) Take 1 tablet by mouth every morning.     DULoxetine (CYMBALTA) 30 MG capsule Take 1 capsule (30 mg total) by mouth daily. Take with a 60 mg capsule to equal total dose of 90 mg. 90 capsule 0   DULoxetine (CYMBALTA) 60 MG capsule Take 1 capsule (60 mg total) by mouth daily. Take with a 30 mg capsule to equal total dose of 90 mg. 90 capsule 0   esomeprazole (NEXIUM) 20 MG capsule Take 20 mg by mouth as needed.     lurasidone (LATUDA) 20 MG TABS tablet Take daily with supper for 2-5 days, then stop. 5 tablet 0   REPATHA SURECLICK 099 MG/ML SOAJ INJECT 1 PEN INTO THE SKIN EVERY 14 DAYS (Patient taking differently: Inject 140 mg into the skin every 14 (fourteen) days. Every other Monday) 2 mL 11   rizatriptan (MAXALT) 10 MG tablet Take 10 mg by mouth See admin instructions. Take one tablet (10 mg) by mouth at onset of migraine headache, may repeat in 2 hours if still needed.     valsartan (DIOVAN) 80 MG tablet Take 1 tablet (80 mg total) by mouth daily. 30 tablet 5   zaleplon (SONATA) 10 MG capsule Take 1 capsule (10 mg total) by mouth at bedtime as needed for sleep. 30 capsule 2   No current facility-administered medications on file prior to visit.   Allergies  Allergen Reactions   Ace Inhibitors Cough   Ambien [Zolpidem] Other (See Comments)    Other reaction(s): headache/neck throbbing   Amoxicillin Other (See Comments)    Gi upset   Codeine Nausea And Vomiting    Can tolerate hydrocodone if has food on stomach     Cortisone Other (See Comments)    Extremely high BP- reaction to injection   Felodipine Other (See Comments)     increased flushing and made her feel bad   Hydralazine     nausea   Hydrocodone-Acetaminophen Nausea And Vomiting    Must  eat before and after each dose   Lisinopril Cough    Dry cough   Losartan Potassium Cough   Nexletol [Bempedoic Acid] Other (See Comments)    Brain fog   Venlafaxine Other (See Comments)    headaches/elevated blood pressure   Zetia [Ezetimibe] Diarrhea   Crestor [Rosuvastatin] Rash    facial rash   Lamictal [Lamotrigine] Rash   Statins Rash    Face and leg rash with rosuvastatin, pravastatin, and atorvastatin   Assessment/Plan:  1. Hypertension - BP of 144/89 is not at goal <130/80 mmHg. She is tolerating valsartan. Fatigue could be from carvedilol or from her Cymbalata or Latuda. She states that  the only BP medication that has worked for her is lisinopril but it caused a cough. She stays she is willing to retry. HCTZ also worked well, but she ended up in the hospital with dehydrating and electrolyte abnormalities and AKI. With her being on a SNRI I would be concerned for hyponatremia. Need to recheck BMP today since her scr bumped with valsartan start. If normalized or stabilized and patient wanted, could try switching to lisinopril. Hesitant to try spironolactone with her hx with HCTZ. She did not tolerate hydralazine. Had increased flushing, malaise with felodipine. Doesn't look like she has tried amlodipine. Might be able to trial a low dose of 2.'5mg'$  daily. Really unsure of what her BP is doing at home since her technique was incorrect, but I suspect its typically above goal. I have reviewed proper technique with her. Given written instructions. She is aware that someone will call her tomorrow with her lab results and medication changes. She is flying to [facility] tomorrow and has requested that if she does not answer to leave a detailed message on the machine.     Ramond Dial, Pharm.D, BCPS, CPP [place]  [address] [place], [place] [address]  Phone: [phone]; Fax: [phone]  08/08/2021 7:38 AM

## 2021-08-08 NOTE — Patient Instructions (Signed)
Your blood pressure goal is <130/80  To check your pressure at home you will need to:  1. Sit up in a chair, with feet flat on the floor and back supported. Do not cross your ankles or legs. 2. Rest your left arm so that the cuff is about heart level. If the cuff goes on your upper arm,  then just relax the arm on the table, arm of the chair or your lap. If you have a wrist cuff, we  suggest relaxing your wrist against your chest (think of it as Pledging the Flag with the  wrong arm).  3. Place the cuff snugly around your arm, about 1 inch above the crook of your elbow. The  cords should be inside the groove of your elbow.  4. Sit quietly, with the cuff in place, for about 5 minutes. After that 5 minutes press the power  button to start a reading. 5. Do not talk or move while the reading is taking place.  6. Record your readings on a sheet of paper. Although most cuffs have a memory, it is often  easier to see a pattern developing when the numbers are all in front of you.  7. You can repeat the reading after 1-3 minutes if it is recommended  Make sure your bladder is empty and you have not had caffeine or tobacco within the last 30 min  Always bring your blood pressure log with you to your appointments. If you have not brought your monitor in to be double checked for accuracy, please bring it to your next appointment.  You can find a list of validated (accurate) blood pressure cuffs at validatebp.org

## 2021-08-09 ENCOUNTER — Telehealth: Payer: Self-pay | Admitting: Pharmacist

## 2021-08-09 ENCOUNTER — Ambulatory Visit: Payer: BC Managed Care – PPO | Admitting: Psychiatry

## 2021-08-09 LAB — BASIC METABOLIC PANEL
BUN/Creatinine Ratio: 11 — ABNORMAL LOW (ref 12–28)
BUN: 13 mg/dL (ref 8–27)
CO2: 23 mmol/L (ref 20–29)
Calcium: 10.3 mg/dL (ref 8.7–10.3)
Chloride: 101 mmol/L (ref 96–106)
Creatinine, Ser: 1.18 mg/dL — ABNORMAL HIGH (ref 0.57–1.00)
Glucose: 96 mg/dL (ref 70–99)
Potassium: 4.4 mmol/L (ref 3.5–5.2)
Sodium: 138 mmol/L (ref 134–144)
eGFR: 53 mL/min/{1.73_m2} — ABNORMAL LOW (ref >59)

## 2021-08-09 MED ORDER — AMLODIPINE BESYLATE 2.5 MG PO TABS: 2.5000 mg | ORAL_TABLET | Freq: Every day | ORAL | 3 refills | Status: DC

## 2021-08-09 NOTE — Telephone Encounter (Signed)
Pt returned call, amlodipine to be sent to local [facility] and f/u scheduled in 3-4 weeks.

## 2021-08-09 NOTE — Telephone Encounter (Signed)
Called patient and left a detailed VM per patient request. Labs are stable from a month ago. Continue valsartan '80mg'$  daily and carvedilol '25mg'$  BID. Will try adding amlodipine 2.'5mg'$  daily.  Patient is on her way out of town. I have requested that she call us and let us know if she would like the prescription sent to a pharmacy near her is [place] or if she would like it sent to her local pharmacy for pick up when she is back in town Sunday. Patient will also need follow up appointment scheduled.

## 2021-08-13 DIAGNOSIS — F332 Major depressive disorder, recurrent severe without psychotic features: Secondary | ICD-10-CM | POA: Diagnosis not present

## 2021-08-14 DIAGNOSIS — F332 Major depressive disorder, recurrent severe without psychotic features: Secondary | ICD-10-CM | POA: Diagnosis not present

## 2021-08-15 DIAGNOSIS — F332 Major depressive disorder, recurrent severe without psychotic features: Secondary | ICD-10-CM | POA: Diagnosis not present

## 2021-08-16 DIAGNOSIS — F332 Major depressive disorder, recurrent severe without psychotic features: Secondary | ICD-10-CM | POA: Diagnosis not present

## 2021-08-17 DIAGNOSIS — F332 Major depressive disorder, recurrent severe without psychotic features: Secondary | ICD-10-CM | POA: Diagnosis not present

## 2021-08-20 DIAGNOSIS — F332 Major depressive disorder, recurrent severe without psychotic features: Secondary | ICD-10-CM | POA: Diagnosis not present

## 2021-08-21 DIAGNOSIS — F332 Major depressive disorder, recurrent severe without psychotic features: Secondary | ICD-10-CM | POA: Diagnosis not present

## 2021-08-22 DIAGNOSIS — F332 Major depressive disorder, recurrent severe without psychotic features: Secondary | ICD-10-CM | POA: Diagnosis not present

## 2021-08-23 DIAGNOSIS — F332 Major depressive disorder, recurrent severe without psychotic features: Secondary | ICD-10-CM | POA: Diagnosis not present

## 2021-08-24 DIAGNOSIS — F332 Major depressive disorder, recurrent severe without psychotic features: Secondary | ICD-10-CM | POA: Diagnosis not present

## 2021-08-27 DIAGNOSIS — F332 Major depressive disorder, recurrent severe without psychotic features: Secondary | ICD-10-CM | POA: Diagnosis not present

## 2021-08-28 DIAGNOSIS — F332 Major depressive disorder, recurrent severe without psychotic features: Secondary | ICD-10-CM | POA: Diagnosis not present

## 2021-08-29 DIAGNOSIS — F332 Major depressive disorder, recurrent severe without psychotic features: Secondary | ICD-10-CM | POA: Diagnosis not present

## 2021-08-30 DIAGNOSIS — F332 Major depressive disorder, recurrent severe without psychotic features: Secondary | ICD-10-CM | POA: Diagnosis not present

## 2021-08-31 DIAGNOSIS — F332 Major depressive disorder, recurrent severe without psychotic features: Secondary | ICD-10-CM | POA: Diagnosis not present

## 2021-09-03 ENCOUNTER — Ambulatory Visit: Payer: BC Managed Care – PPO

## 2021-09-10 DIAGNOSIS — F332 Major depressive disorder, recurrent severe without psychotic features: Secondary | ICD-10-CM | POA: Diagnosis not present

## 2021-09-11 ENCOUNTER — Encounter: Payer: Self-pay | Admitting: Psychiatry

## 2021-09-11 ENCOUNTER — Ambulatory Visit (INDEPENDENT_AMBULATORY_CARE_PROVIDER_SITE_OTHER): Payer: BC Managed Care – PPO | Admitting: Psychiatry

## 2021-09-11 DIAGNOSIS — F32A Depression, unspecified: Secondary | ICD-10-CM | POA: Diagnosis not present

## 2021-09-11 DIAGNOSIS — F332 Major depressive disorder, recurrent severe without psychotic features: Secondary | ICD-10-CM | POA: Diagnosis not present

## 2021-09-11 NOTE — Progress Notes (Signed)
Amanda [name] [id number] [birth year] 61 y.o.  Subjective:   Patient ID:  Amanda [name] is a 61 y.o. (DOB October 11, 1960) female.  Chief Complaint:  Chief Complaint  Patient presents with   Depression   Anxiety    HPI Amanda [name] presents to the office today for follow-up of depression, anxiety, and history of insomnia. She reports that she has been "depressed" and has had some anxiety. She reports that her family went to [place] and [facility] and did not enjoy it. Akathisia resolved after stopping Latuda. She reports mood has been lower for the last couple of weeks. Low energy and low motivation- "I don't want to do anything." She reports that she is wanting to stay in bed if possible. Going to [place] and otherwise is staying home.   She reports frequent worry and anxious thoughts. Reports feeling rushed. "Like I am looking for something to worry about." Denies physical s/s or panic with anxiety. Has been needing Xanax prn during the day. Has had some tearfulness. She has been sleeping ok. Appetite has been decreased and eating less. Reports that she has not been losing weight. Difficulty with concentration. Hard to get started on tasks. Anhedonia. Socially withdrawn- "just want to be left alone." Denies SI.   Denies any response to [place].  Past Psychiatric Medication Trials: Wellbutrin XL- No improvement at 300 mg dose. Trintellix Lexapro- "felt the best on." Reports that she had some hair loss Zoloft Celexa Viibryd Prozac Effexor Remeron Auvelity- Ineffective Ambien-Effective and then no longer effective. Sleep was not restful. Parasomnias. Lunesta-Ineffective Belsomra-Ineffective Trazodone-ineffective.  Doxepin-Ineffective Klonopin xanax Gabapentin Lamictal- Rash Abilify Rexulti- Akathisia. No benefit Latuda- Akathisia, clinching teeth, nausea, food cravings [place]    Flowsheet Row ED from 04/30/2021 in [facility]PT ED to Hosp-Admission  (Discharged) from 04/16/2021 in [facility] ED to Hosp-Admission (Discharged) from 03/21/2020 in [facility]  C-SSRS RISK CATEGORY No Risk No Risk No Risk        Review of Systems:  Review of Systems  Musculoskeletal:  Negative for gait problem.       Reports that her legs have been aching which she attributes to new calcium channel blocker  Neurological:  Negative for tremors and headaches.  Psychiatric/Behavioral:         Please refer to HPI    Medications: I have reviewed the patient's current medications.  Current Outpatient Medications  Medication Sig Dispense Refill   ALPRAZolam (XANAX) 1 MG tablet TAKE 1 TABLET AT BEDTIME AND 1/2-1 TABLET AS NEEDED FOR ANXIETY. 45 tablet 5   amLODipine (NORVASC) 2.5 MG tablet Take 1 tablet (2.5 mg total) by mouth daily. 30 tablet 3   cariprazine (VRAYLAR) 1.5 MG capsule Take 1 capsule every other day for depression 28 capsule 0   carvedilol (COREG) 25 MG tablet Take 1 tablet (25 mg total) by mouth 2 (two) times daily with a meal. 60 tablet 5   Cholecalciferol (VITAMIN D3) 50 MCG (2000 UT) capsule Take 1 tablet by mouth every morning.     Cyanocobalamin (VITAMIN B-12 PO) Take 1 tablet by mouth every morning.     DULoxetine (CYMBALTA) 30 MG capsule Take 1 capsule (30 mg total) by mouth daily. Take with a 60 mg capsule to equal total dose of 90 mg. 90 capsule 0   DULoxetine (CYMBALTA) 60 MG capsule Take 1 capsule (60 mg total) by mouth daily. Take with a 30 mg  capsule to equal total dose of 90 mg. 90 capsule 0   esomeprazole (NEXIUM) 20 MG capsule Take 20 mg by mouth as needed.     REPATHA SURECLICK 962 MG/ML SOAJ INJECT 1 PEN INTO THE SKIN EVERY 14 DAYS (Patient taking differently: Inject 140 mg into the skin every 14 (fourteen) days. Every other Monday) 2 mL 11   rizatriptan (MAXALT) 10 MG tablet Take 10 mg by mouth See admin instructions. Take one tablet (10 mg) by mouth at onset of  migraine headache, may repeat in 2 hours if still needed.     valsartan (DIOVAN) 80 MG tablet Take 1 tablet (80 mg total) by mouth daily. 30 tablet 5   zaleplon (SONATA) 10 MG capsule Take 1 capsule (10 mg total) by mouth at bedtime as needed for sleep. 30 capsule 2   No current facility-administered medications for this visit.    Medication Side Effects: None  Allergies:  Allergies  Allergen Reactions   Ace Inhibitors Cough   Ambien [Zolpidem] Other (See Comments)    Other reaction(s): headache/neck throbbing   Amoxicillin Other (See Comments)    Gi upset   Codeine Nausea And Vomiting    Can tolerate hydrocodone if has food on stomach     Cortisone Other (See Comments)    Extremely high BP- reaction to injection   Felodipine Other (See Comments)     increased flushing and made her feel bad   Hydralazine     nausea   Hydrocodone-Acetaminophen Nausea And Vomiting    Must eat before and after each dose   Lisinopril Cough    Dry cough   Losartan Potassium Cough   Nexletol [Bempedoic Acid] Other (See Comments)    Brain fog   Venlafaxine Other (See Comments)    headaches/elevated blood pressure   Zetia [Ezetimibe] Diarrhea   Crestor [Rosuvastatin] Rash    facial rash   Lamictal [Lamotrigine] Rash   Statins Rash    Face and leg rash with rosuvastatin, pravastatin, and atorvastatin    Past Medical History:  Diagnosis Date   Anxiety    Asthma    Chronic kidney disease    Chronic kidney disease, stage 3 ([place]) 08/10/2019   Depression    Hyperlipidemia    Hypertension    Migraine     Past Medical History, Surgical history, Social history, and Family history were reviewed and updated as appropriate.   Please see review of systems for further details on the patient's review from today.   Objective:   Physical Exam:  LMP 03/23/2011   Physical Exam Constitutional:      General: She is not in acute distress. Musculoskeletal:        General: No deformity.   Neurological:     Mental Status: She is alert and oriented to person, place, and time.     Coordination: Coordination normal.  Psychiatric:        Attention and Perception: Attention and perception normal. She does not perceive auditory or visual hallucinations.        Mood and Affect: Mood is anxious and depressed. Affect is not labile, blunt, angry or inappropriate.        Speech: Speech normal.        Behavior: Behavior normal.        Thought Content: Thought content normal. Thought content is not paranoid or delusional. Thought content does not include homicidal or suicidal ideation. Thought content does not include homicidal or suicidal plan.  Cognition and Memory: Cognition and memory normal.        Judgment: Judgment normal.     Comments: Insight intact     Lab Review:     Component Value Date/Time   NA 138 08/08/2021 1411   K 4.4 08/08/2021 1411   CL 101 08/08/2021 1411   CO2 23 08/08/2021 1411   GLUCOSE 96 08/08/2021 1411   GLUCOSE 119 (H) 04/30/2021 1234   BUN 13 08/08/2021 1411   CREATININE 1.18 (H) 08/08/2021 1411   CALCIUM 10.3 08/08/2021 1411   PROT 7.6 04/30/2021 1234   PROT 6.6 07/03/2020 1128   ALBUMIN 4.2 04/30/2021 1234   ALBUMIN 3.9 07/03/2020 1128   AST 22 04/30/2021 1234   ALT 26 04/30/2021 1234   ALKPHOS 64 04/30/2021 1234   BILITOT 0.4 04/30/2021 1234   BILITOT 0.5 07/03/2020 1128   GFRNONAA >60 04/30/2021 1234   GFRAA 38 (L) 11/03/2019 1107       Component Value Date/Time   WBC 11.3 (H) 04/30/2021 1234   RBC 4.58 04/30/2021 1234   HGB 13.9 04/30/2021 1234   HCT 41.2 04/30/2021 1234   PLT 385 04/30/2021 1234   MCV 90.0 04/30/2021 1234   MCH 30.3 04/30/2021 1234   MCHC 33.7 04/30/2021 1234   RDW 12.5 04/30/2021 1234   LYMPHSABS 3.3 04/18/2021 0415   MONOABS 0.7 04/18/2021 0415   EOSABS 0.1 04/18/2021 0415   BASOSABS 0.1 04/18/2021 0415    No results found for: "POCLITH", "LITHIUM"   No results found for: "PHENYTOIN",  "PHENOBARB", "VALPROATE", "CBMZ"   .res Assessment: Plan:   Patient seen for 29 minutes and time spent discussing recent worsening depression.  Discussed that depressive signs and symptoms seem to have possibly worsened following discontinuation of Latuda, however she had multiple adverse effects while taking Latuda, even at low dose.  She also reports limited improvement with TMS treatments.  She feels that Cymbalta has been helpful for depression, however she is currently on 90 mg daily and would not recommend increasing dose further due to history of elevated creatinine.  Discussed considering augmentation strategies.  Discussed that atypical antipsychotics have seemed to be somewhat helpful for her depression, however she has experienced severe akathisia with several atypical antipsychotics.  Discussed potential benefits, risks, and side effects of Vraylar.  Discussed trial of Vraylar at very low dose to minimize risk of akathisia.  Patient agrees to trial of Vraylar.  Will start Vraylar 1.5 mg every other day for augmentation of depression. Continue duloxetine 90 mg daily for depression and anxiety. Continue Xanax 1 mg at bedtime and 1/2-1 tab as needed for severe anxiety. Continue Sonata 10 mg at bedtime for insomnia. Pt to follow-up with this provider in 4 weeks or sooner if clinically indicated.  Patient advised to contact office with any questions, adverse effects, or acute worsening in signs and symptoms.   Laynee was seen today for depression and anxiety.  Diagnoses and all orders for this visit:  Depression, unspecified depression type -     cariprazine (VRAYLAR) 1.5 MG capsule; Take 1 capsule every other day for depression     Please see After Visit Summary for patient specific instructions.  Future Appointments  Date Time Provider [place]  09/20/2021  3:30 PM [facility] PHARMACIST [facility]  10/09/2021 11:00 AM Thayer Headings, PMHNP [facility] None    No  orders of the defined types were placed in this encounter.   -------------------------------

## 2021-09-12 DIAGNOSIS — F332 Major depressive disorder, recurrent severe without psychotic features: Secondary | ICD-10-CM | POA: Diagnosis not present

## 2021-09-13 DIAGNOSIS — F332 Major depressive disorder, recurrent severe without psychotic features: Secondary | ICD-10-CM | POA: Diagnosis not present

## 2021-09-14 DIAGNOSIS — F332 Major depressive disorder, recurrent severe without psychotic features: Secondary | ICD-10-CM | POA: Diagnosis not present

## 2021-09-15 MED ORDER — CARIPRAZINE HCL 1.5 MG PO CAPS: ORAL_CAPSULE | ORAL | 0 refills | Status: DC

## 2021-09-17 DIAGNOSIS — F332 Major depressive disorder, recurrent severe without psychotic features: Secondary | ICD-10-CM | POA: Diagnosis not present

## 2021-09-19 DIAGNOSIS — F332 Major depressive disorder, recurrent severe without psychotic features: Secondary | ICD-10-CM | POA: Diagnosis not present

## 2021-09-19 NOTE — Progress Notes (Unsigned)
Patient ID: GRETE [name]                 DOB: 21-Apr-1960                      MRN: [id number]     HPI: Amanda [name] is a 61 y.o. female referred by Dr. Angelena Form to HTN clinic. PMH is significant for HTN, HLD, palpitations (PACs/PVCs), CKD3, asthma, GERD, and anxiety/depression. Admitted 3/20-3/22/23 following syncopal episode likely from dehydration (hyponatremia Na 132, hypokalemia K 3.4) with mild worsening of renal function. Stopped HCTZ and started hydralazine for BP control after admission. At time of syncopal episode in March, pt was doing [place] diet (drinking 80 oz water/day, using diet protein packets, cooking one lean and green meal/day). After discharge Dr. Angelena Form increased metoprolol to 50 mg BID and referred to HTN clinic after patient sent a message that her BP was still elevated to 160/86 after this change. BP was 124/64 when seen by HTN clinic 06/12/21 but patient reported nausea and TID dosing with hydralazine was inhibiting QoL and requested change. Hydralazine was discontinued and valsartan initiated after clarifying that patient has only ever experienced dry cough (no swelling) with ACEi and patient had no memory of trying losartan in the past. Metoprolol was also switched to equivalent dose of carvedilol.   At last visit on 6/13 BP was 136/90. Carvedilol was increased to '25mg'$  twice a day. Valsartan dose was not increased due to a increase in scr.   Pt seen 08/08/21 for HTN follow up. She reported fatigue. Pt's BP technique with home cuff was incorrect with cuff placement over elbow. Pt was upset that medication was making her feel poorly and BP not controlled. Home cuff readings 144-155/95-100 in office, and manual BP 144/89. Pt reported tolerating valsartan. Noted fatigue could be from carvedilol, cymbalta or latuda which she was weaning off of due to nausea/fidgeting/weight gain. Lisinopril caused cough in past but was willing to retry and HCTZ was also effective but ended up in  hospital dehydration, electrolyte abnormalities and AKI. With SNRI would be concerned about hyponatremia. BMET was checked to assess Scr normalization (bumped with valsartan start). BMET was stable, amlodipine 2.5 mg daily was added.   Pt presents for HTN follow-up today. She shares that she started taking amlodipine on 7/17. She shares that recently her home blood pressures are improved since starting amlodipine. However, she feels more tired than before, needs to take a nap each day and her legs "feel like a 100 pounds each." She also notices her ankles are a bit more swollen than usual. She feels even more tired than she did before starting amlodipine. She does not really experience dizziness except when she gets up too fast. Pt did share that she is off [place] and started on Vraylar and is feeling much better from that perspective. She is not getting much physical activity, and is not doing more than her necessary daily activities since she is feeling so tired.   Current HTN meds: valsartan 80 mg daily, carvedilol 25 mg BID, amlodipine 2.5 mg daily  Previously tried: lisinopril (cough), losartan (cough), felodipine (increased flushing, malaise), HCTZ (syncope, worsening Scr that improved with discontinuation), metoprolol (switched to carvedilol for better BP lowering), hydralazine (nausea, abdominal pain) BP goal: <130/80 mmHg  Family History: HLD in mother/father/sister, CAD s/p CABG in father, HTN in sister  Social History: Former smoker (2.5 pack year history, quit 1989). Drinks alcohol occasionally in social settings. Denies  drug use.   Diet: Tries not to add salt to foods. Drinks 1-2 cokes per day. Tries to avoid fried foods. Eats a lot of chicken, fish.   Exercise: Walking 1 mile sometimes, recently doesn't have the energy for it  Home BP readings:  Upper arm cuff From 7/18 to 8/24 144/91, 140/88, 126/81, 147/87, 133/81, 117/80, 123/83, 128/85, 111/77, 121/74, 124/89, 128/86  Wt  Readings from Last 3 Encounters:  04/30/21 150 lb (68 kg)  04/27/21 151 lb (68.5 kg)  04/18/21 148 lb 2.4 oz (67.2 kg)   BP Readings from Last 3 Encounters:  08/08/21 (!) 144/89  07/10/21 136/90  06/12/21 124/64   Pulse Readings from Last 3 Encounters:  08/08/21 76  07/10/21 83  04/30/21 64   Renal function: CrCl cannot be calculated (Patient's most recent lab result is older than the maximum 21 days allowed.).  Past Medical History:  Diagnosis Date   Anxiety    Asthma    Chronic kidney disease    Chronic kidney disease, stage 3 ([place]) 08/10/2019   Depression    Hyperlipidemia    Hypertension    Migraine    Current Outpatient Medications on File Prior to Visit  Medication Sig Dispense Refill   ALPRAZolam (XANAX) 1 MG tablet TAKE 1 TABLET AT BEDTIME AND 1/2-1 TABLET AS NEEDED FOR ANXIETY. 45 tablet 5   amLODipine (NORVASC) 2.5 MG tablet Take 1 tablet (2.5 mg total) by mouth daily. 30 tablet 3   cariprazine (VRAYLAR) 1.5 MG capsule Take 1 capsule every other day for depression 28 capsule 0   carvedilol (COREG) 25 MG tablet Take 1 tablet (25 mg total) by mouth 2 (two) times daily with a meal. 60 tablet 5   Cholecalciferol (VITAMIN D3) 50 MCG (2000 UT) capsule Take 1 tablet by mouth every morning.     Cyanocobalamin (VITAMIN B-12 PO) Take 1 tablet by mouth every morning.     DULoxetine (CYMBALTA) 30 MG capsule Take 1 capsule (30 mg total) by mouth daily. Take with a 60 mg capsule to equal total dose of 90 mg. 90 capsule 0   DULoxetine (CYMBALTA) 60 MG capsule Take 1 capsule (60 mg total) by mouth daily. Take with a 30 mg capsule to equal total dose of 90 mg. 90 capsule 0   esomeprazole (NEXIUM) 20 MG capsule Take 20 mg by mouth as needed.     REPATHA SURECLICK 638 MG/ML SOAJ INJECT 1 PEN INTO THE SKIN EVERY 14 DAYS (Patient taking differently: Inject 140 mg into the skin every 14 (fourteen) days. Every other Monday) 2 mL 11   rizatriptan (MAXALT) 10 MG tablet Take 10 mg by mouth  See admin instructions. Take one tablet (10 mg) by mouth at onset of migraine headache, may repeat in 2 hours if still needed.     valsartan (DIOVAN) 80 MG tablet Take 1 tablet (80 mg total) by mouth daily. 30 tablet 5   zaleplon (SONATA) 10 MG capsule Take 1 capsule (10 mg total) by mouth at bedtime as needed for sleep. 30 capsule 2   No current facility-administered medications on file prior to visit.   Allergies  Allergen Reactions   Ace Inhibitors Cough   Ambien [Zolpidem] Other (See Comments)    Other reaction(s): headache/neck throbbing   Amoxicillin Other (See Comments)    Gi upset   Codeine Nausea And Vomiting    Can tolerate hydrocodone if has food on stomach     Cortisone Other (See Comments)  Extremely high BP- reaction to injection   Felodipine Other (See Comments)     increased flushing and made her feel bad   Hydralazine     nausea   Hydrocodone-Acetaminophen Nausea And Vomiting    Must eat before and after each dose   Lisinopril Cough    Dry cough   Losartan Potassium Cough   Nexletol [Bempedoic Acid] Other (See Comments)    Brain fog   Venlafaxine Other (See Comments)    headaches/elevated blood pressure   Zetia [Ezetimibe] Diarrhea   Crestor [Rosuvastatin] Rash    facial rash   Lamictal [Lamotrigine] Rash   Statins Rash    Face and leg rash with rosuvastatin, pravastatin, and atorvastatin   Assessment/Plan:  1. Hypertension - BP of 138/82 is above goal of <130/80 though improved. Home readings have also been recently improved since starting amlodipine. However, due to significant fatigue and leg swelling will have patient stop amlodipine. Discussed patient's dry cough with lisinopril in the past. She believes that this is more tolerable than her current fatigue with amlodipine. Patient would like to re-try lisinopril as she said it worked really well in the past. Her medication options are very limited. She does not want to re-try hydralazine. Clonidine is  not optimal due to side effect profile. Will start lisinopril at 10 mg daily. STOP valsartan and amlodipine. Instructed patient to continue carvedilol 25 mg BID. Scheduled patient to return for BMET on 9/1 and for in-person follow up on 9/27. If BMET is stable on 9/1 and BP still high, will increase lisinopril to '20mg'$  daily. Last resort would be to discuss with Dr. Angelena Form the cause of her AKI and hyponatremia- could have been from excessive water intake? Encouraged patient to continue taking blood pressures at home.   Eliseo Gum, PharmD PGY1 Pharmacy Resident   09/19/2021  4:53 PM   Ramond Dial, Pharm.D, BCPS, CPP [facility]  [address] [place], [place] [address]  Phone: [phone]; Fax: [phone]  09/19/2021 4:53 PM

## 2021-09-20 ENCOUNTER — Ambulatory Visit (INDEPENDENT_AMBULATORY_CARE_PROVIDER_SITE_OTHER): Payer: BC Managed Care – PPO | Admitting: Pharmacist

## 2021-09-20 VITALS — BP 138/82 | HR 79

## 2021-09-20 DIAGNOSIS — I10 Essential (primary) hypertension: Secondary | ICD-10-CM | POA: Diagnosis not present

## 2021-09-20 MED ORDER — LISINOPRIL 10 MG PO TABS: 10.0000 mg | ORAL_TABLET | Freq: Every day | ORAL | 3 refills | Status: DC

## 2021-09-20 NOTE — Patient Instructions (Addendum)
Your BP today is 138/82. We would like it to be <130/80.   Will stop valsartan 80 mg daily and amlodipine 2.5 mg daily.   Start taking lisinopril 10 mg daily.  Continue taking carvedilol 25 mg twice daily.   We will have you come in for labs on 9/1.  Continue taking blood pressures at home.   We will have you follow up in person on 9/27 at 9:30 am.

## 2021-09-21 DIAGNOSIS — F332 Major depressive disorder, recurrent severe without psychotic features: Secondary | ICD-10-CM | POA: Diagnosis not present

## 2021-09-24 DIAGNOSIS — F332 Major depressive disorder, recurrent severe without psychotic features: Secondary | ICD-10-CM | POA: Diagnosis not present

## 2021-09-26 DIAGNOSIS — F332 Major depressive disorder, recurrent severe without psychotic features: Secondary | ICD-10-CM | POA: Diagnosis not present

## 2021-09-27 DIAGNOSIS — Z6828 Body mass index (BMI) 28.0-28.9, adult: Secondary | ICD-10-CM | POA: Diagnosis not present

## 2021-09-27 DIAGNOSIS — N760 Acute vaginitis: Secondary | ICD-10-CM | POA: Diagnosis not present

## 2021-09-27 DIAGNOSIS — Z124 Encounter for screening for malignant neoplasm of cervix: Secondary | ICD-10-CM | POA: Diagnosis not present

## 2021-09-27 DIAGNOSIS — Z01419 Encounter for gynecological examination (general) (routine) without abnormal findings: Secondary | ICD-10-CM | POA: Diagnosis not present

## 2021-09-28 ENCOUNTER — Other Ambulatory Visit: Payer: BC Managed Care – PPO

## 2021-09-28 DIAGNOSIS — F332 Major depressive disorder, recurrent severe without psychotic features: Secondary | ICD-10-CM | POA: Diagnosis not present

## 2021-10-03 DIAGNOSIS — F332 Major depressive disorder, recurrent severe without psychotic features: Secondary | ICD-10-CM | POA: Diagnosis not present

## 2021-10-05 ENCOUNTER — Ambulatory Visit: Payer: BC Managed Care – PPO | Attending: Cardiovascular Disease

## 2021-10-05 DIAGNOSIS — F332 Major depressive disorder, recurrent severe without psychotic features: Secondary | ICD-10-CM | POA: Diagnosis not present

## 2021-10-05 DIAGNOSIS — I10 Essential (primary) hypertension: Secondary | ICD-10-CM

## 2021-10-06 LAB — BASIC METABOLIC PANEL
BUN/Creatinine Ratio: 11 — ABNORMAL LOW (ref 12–28)
BUN: 11 mg/dL (ref 8–27)
CO2: 23 mmol/L (ref 20–29)
Calcium: 10 mg/dL (ref 8.7–10.3)
Chloride: 105 mmol/L (ref 96–106)
Creatinine, Ser: 1.01 mg/dL — ABNORMAL HIGH (ref 0.57–1.00)
Glucose: 103 mg/dL — ABNORMAL HIGH (ref 70–99)
Potassium: 4.6 mmol/L (ref 3.5–5.2)
Sodium: 143 mmol/L (ref 134–144)
eGFR: 63 mL/min/{1.73_m2} (ref >59)

## 2021-10-08 ENCOUNTER — Telehealth: Payer: Self-pay | Admitting: Pharmacist

## 2021-10-08 NOTE — Telephone Encounter (Signed)
BMP stable after changing valsartan to lisinopril '10mg'$  daily. Patient reports blood pressures around 125/85. Will continue lisinopril '10mg'$  for now. Follow up already scheduled for the end of the month.

## 2021-10-09 ENCOUNTER — Ambulatory Visit: Payer: BC Managed Care – PPO | Admitting: Psychiatry

## 2021-10-09 ENCOUNTER — Encounter: Payer: Self-pay | Admitting: Psychiatry

## 2021-10-09 DIAGNOSIS — F32A Depression, unspecified: Secondary | ICD-10-CM | POA: Diagnosis not present

## 2021-10-09 DIAGNOSIS — F5104 Psychophysiologic insomnia: Secondary | ICD-10-CM

## 2021-10-09 DIAGNOSIS — F419 Anxiety disorder, unspecified: Secondary | ICD-10-CM | POA: Diagnosis not present

## 2021-10-09 MED ORDER — ZALEPLON 10 MG PO CAPS: 10.0000 mg | ORAL_CAPSULE | Freq: Every evening | ORAL | 5 refills | Status: DC | PRN

## 2021-10-09 MED ORDER — DULOXETINE HCL 60 MG PO CPEP: 60.0000 mg | ORAL_CAPSULE | Freq: Every day | ORAL | 0 refills | Status: DC

## 2021-10-09 MED ORDER — CARIPRAZINE HCL 1.5 MG PO CAPS: ORAL_CAPSULE | ORAL | 0 refills | Status: DC

## 2021-10-09 MED ORDER — ALPRAZOLAM 1 MG PO TABS: ORAL_TABLET | ORAL | 4 refills | Status: DC

## 2021-10-09 MED ORDER — DULOXETINE HCL 30 MG PO CPEP: 30.0000 mg | ORAL_CAPSULE | Freq: Every day | ORAL | 0 refills | Status: DC

## 2021-10-09 NOTE — Progress Notes (Signed)
Amanda [name] [id number] 03-12-1960 61 y.o.  Subjective:   Patient ID:  Amanda [name] is a 61 y.o. (DOB 10/25/1960) female.  Chief Complaint:  Chief Complaint  Patient presents with   Depression    HPI MEENAKSHI SAZAMA presents to the office today for follow-up of depression, anxiety, and insomnia. She reports that she is "having more good days than bad days." She reports that Vraylar seems to be helping. She completed TMS this week. She reports she has a couple of days each week where she is depressed and will stay in bed more. She reports that she has had a couple of days where anxiety was increased. She reports that she as "gotten better" about not getting anxious and worrying about things or taking on daughter's anxiety.   Energy is low. She has been thinking more about doing things. Motivation is slightly improved. She took care of calling about a home repair that she has been putting off. She has been doing more housework. She has not been going into work. She is doing some work from home. Has been going to medical appointments. Notices she is procrastinating. She reports concentration "is good when I decide to do it." Not getting behind on deadlines. Denies anhedonia and is enjoying daughter's field hockey games. Appetite has been "too good" and denies increased appetite with Vraylar. Reports gaining 12 lbs in 6 years. Some difficulty falling asleep and is restless. Sleeping through the night. Denies SI.   She reports that she has had some restlessness at night "but no where near" like what she experienced with other atypical antipsychotics. More restlessness on the nights she has taken Vraylar.  Zaleplon last filled 09/11/21 x3 Alprazolam last filled 08/13/21 x 3   Past Psychiatric Medication Trials: Wellbutrin XL- No improvement at 300 mg dose. Trintellix Lexapro- "felt the best on." Reports that she had some hair loss Zoloft Celexa Viibryd Prozac Effexor Remeron Auvelity-  Ineffective Ambien-Effective and then no longer effective. Sleep was not restful. Parasomnias. Lunesta-Ineffective Belsomra-Ineffective Trazodone-ineffective.  Doxepin-Ineffective Klonopin xanax Gabapentin Lamictal- Rash Abilify Rexulti- Akathisia. No benefit Latuda- Akathisia, clinching teeth, nausea, food cravings [place]    AIMS    [place] Office Visit from 10/09/2021 in [facility] Total Score 0      Flowsheet [facility] from 04/30/2021 in [facility] DEPT ED to Hosp-Admission (Discharged) from 04/16/2021 in [facility] ED to Hosp-Admission (Discharged) from 03/21/2020 in [facility]  C-SSRS RISK CATEGORY No Risk No Risk No Risk        Review of Systems:  Review of Systems  Musculoskeletal:  Negative for gait problem.  Neurological:  Negative for tremors.  Psychiatric/Behavioral:         Please refer to HPI    Medications: I have reviewed the patient's current medications.  Current Outpatient Medications  Medication Sig Dispense Refill   carvedilol (COREG) 25 MG tablet Take 1 tablet (25 mg total) by mouth 2 (two) times daily with a meal. 60 tablet 5   Cholecalciferol (VITAMIN D3) 50 MCG (2000 UT) capsule Take 1 tablet by mouth every morning.     Cyanocobalamin (VITAMIN B-12 PO) Take 1 tablet by mouth every morning.     esomeprazole (NEXIUM) 20 MG capsule Take 20 mg by mouth as needed.     lisinopril (ZESTRIL) 10 MG tablet Take 1 tablet (10 mg total) by mouth daily. 90 tablet 3  REPATHA SURECLICK 315 MG/ML SOAJ INJECT 1 PEN INTO THE SKIN EVERY 14 DAYS (Patient taking differently: Inject 140 mg into the skin every 14 (fourteen) days. Every other Monday) 2 mL 11   [START ON 12/04/2021] ALPRAZolam (XANAX) 1 MG tablet TAKE 1 TABLET AT BEDTIME AND 1/2-1 TABLET AS NEEDED FOR ANXIETY. 45 tablet 4   cariprazine (VRAYLAR) 1.5 MG capsule Take 1 capsule every other  day for depression 28 capsule 0   DULoxetine (CYMBALTA) 30 MG capsule Take 1 capsule (30 mg total) by mouth daily. Take with a 60 mg capsule to equal total dose of 90 mg. 90 capsule 0   DULoxetine (CYMBALTA) 60 MG capsule Take 1 capsule (60 mg total) by mouth daily. Take with a 30 mg capsule to equal total dose of 90 mg. 90 capsule 0   rizatriptan (MAXALT) 10 MG tablet Take 10 mg by mouth See admin instructions. Take one tablet (10 mg) by mouth at onset of migraine headache, may repeat in 2 hours if still needed.     zaleplon (SONATA) 10 MG capsule Take 1 capsule (10 mg total) by mouth at bedtime as needed for sleep. 30 capsule 5   No current facility-administered medications for this visit.    Medication Side Effects: None  Allergies:  Allergies  Allergen Reactions   Ace Inhibitors Cough   Ambien [Zolpidem] Other (See Comments)    Other reaction(s): headache/neck throbbing   Amoxicillin Other (See Comments)    Gi upset   Codeine Nausea And Vomiting    Can tolerate hydrocodone if has food on stomach     Cortisone Other (See Comments)    Extremely high BP- reaction to injection   Felodipine Other (See Comments)     increased flushing and made her feel bad   Hydralazine     nausea   Hydrocodone-Acetaminophen Nausea And Vomiting    Must eat before and after each dose   Lisinopril Cough    Dry cough   Losartan Potassium Cough   Nexletol [Bempedoic Acid] Other (See Comments)    Brain fog   Venlafaxine Other (See Comments)    headaches/elevated blood pressure   Zetia [Ezetimibe] Diarrhea   Crestor [Rosuvastatin] Rash    facial rash   Lamictal [Lamotrigine] Rash   Statins Rash    Face and leg rash with rosuvastatin, pravastatin, and atorvastatin    Past Medical History:  Diagnosis Date   Anxiety    Asthma    Chronic kidney disease    Chronic kidney disease, stage 3 ([place]) 08/10/2019   Depression    Hyperlipidemia    Hypertension    Migraine     Past Medical History,  Surgical history, Social history, and Family history were reviewed and updated as appropriate.   Please see review of systems for further details on the patient's review from today.   Objective:   Physical Exam:  LMP 03/23/2011   Physical Exam Constitutional:      General: She is not in acute distress. Musculoskeletal:        General: No deformity.  Neurological:     Mental Status: She is alert and oriented to person, place, and time.     Coordination: Coordination normal.  Psychiatric:        Attention and Perception: Attention and perception normal. She does not perceive auditory or visual hallucinations.        Mood and Affect: Affect is not labile, blunt, angry or inappropriate.  Speech: Speech normal.        Behavior: Behavior normal.        Thought Content: Thought content normal. Thought content is not paranoid or delusional. Thought content does not include homicidal or suicidal ideation. Thought content does not include homicidal or suicidal plan.        Cognition and Memory: Cognition and memory normal.        Judgment: Judgment normal.     Comments: Insight intact Mood presents as less anxious and less depressed compared to recent visits     Lab Review:     Component Value Date/Time   NA 143 10/05/2021 1241   K 4.6 10/05/2021 1241   CL 105 10/05/2021 1241   CO2 23 10/05/2021 1241   GLUCOSE 103 (H) 10/05/2021 1241   GLUCOSE 119 (H) 04/30/2021 1234   BUN 11 10/05/2021 1241   CREATININE 1.01 (H) 10/05/2021 1241   CALCIUM 10.0 10/05/2021 1241   PROT 7.6 04/30/2021 1234   PROT 6.6 07/03/2020 1128   ALBUMIN 4.2 04/30/2021 1234   ALBUMIN 3.9 07/03/2020 1128   AST 22 04/30/2021 1234   ALT 26 04/30/2021 1234   ALKPHOS 64 04/30/2021 1234   BILITOT 0.4 04/30/2021 1234   BILITOT 0.5 07/03/2020 1128   GFRNONAA >60 04/30/2021 1234   GFRAA 38 (L) 11/03/2019 1107       Component Value Date/Time   WBC 11.3 (H) 04/30/2021 1234   RBC 4.58 04/30/2021 1234   HGB  13.9 04/30/2021 1234   HCT 41.2 04/30/2021 1234   PLT 385 04/30/2021 1234   MCV 90.0 04/30/2021 1234   MCH 30.3 04/30/2021 1234   MCHC 33.7 04/30/2021 1234   RDW 12.5 04/30/2021 1234   LYMPHSABS 3.3 04/18/2021 0415   MONOABS 0.7 04/18/2021 0415   EOSABS 0.1 04/18/2021 0415   BASOSABS 0.1 04/18/2021 0415    No results found for: "POCLITH", "LITHIUM"   No results found for: "PHENYTOIN", "PHENOBARB", "VALPROATE", "CBMZ"   .res Assessment: Plan:    Pt seen for 30 minutes and time spent discussing response to [place] and initiation of Vraylar. She reports that she has experienced some overall improvement in depression. She notices some mild akathisia and reports that it is tolerable and benefits are currently outweighing side effects and would like to continue Vraylar 1.5 mg every other day for depression. May consider possible increase in Vraylar in the future if akathisia lessens.  Will continue Cymbalta 90 mg daily for depression and anxiety.  Continue Alprazolam 1 mg at bedtime and 1/2-1 tab as needed for anxiety.  Continue Sonata 10 mg po QHS prn insomnia.  Pt to follow-up with this provider in 4 weeks or sooner if clinically indicated.  Patient advised to contact office with any questions, adverse effects, or acute worsening in signs and symptoms.'  Tanza was seen today for depression.  Diagnoses and all orders for this visit:  Depression, unspecified depression type -     cariprazine (VRAYLAR) 1.5 MG capsule; Take 1 capsule every other day for depression -     DULoxetine (CYMBALTA) 30 MG capsule; Take 1 capsule (30 mg total) by mouth daily. Take with a 60 mg capsule to equal total dose of 90 mg. -     DULoxetine (CYMBALTA) 60 MG capsule; Take 1 capsule (60 mg total) by mouth daily. Take with a 30 mg capsule to equal total dose of 90 mg.  Chronic insomnia -     ALPRAZolam (XANAX) 1 MG tablet; TAKE 1  TABLET AT BEDTIME AND 1/2-1 TABLET AS NEEDED FOR ANXIETY. -     DULoxetine  (CYMBALTA) 60 MG capsule; Take 1 capsule (60 mg total) by mouth daily. Take with a 30 mg capsule to equal total dose of 90 mg. -     zaleplon (SONATA) 10 MG capsule; Take 1 capsule (10 mg total) by mouth at bedtime as needed for sleep.  Anxiety -     ALPRAZolam (XANAX) 1 MG tablet; TAKE 1 TABLET AT BEDTIME AND 1/2-1 TABLET AS NEEDED FOR ANXIETY. -     DULoxetine (CYMBALTA) 30 MG capsule; Take 1 capsule (30 mg total) by mouth daily. Take with a 60 mg capsule to equal total dose of 90 mg. -     DULoxetine (CYMBALTA) 60 MG capsule; Take 1 capsule (60 mg total) by mouth daily. Take with a 30 mg capsule to equal total dose of 90 mg.     Please see After Visit Summary for patient specific instructions.  Future Appointments  Date Time Provider [place]  10/24/2021  9:30 AM [facility] PHARMACIST [facility]  11/06/2021  1:00 PM Thayer Headings, PMHNP [facility] None    No orders of the defined types were placed in this encounter.   -------------------------------

## 2021-10-12 ENCOUNTER — Telehealth: Payer: Self-pay | Admitting: Psychiatry

## 2021-10-12 DIAGNOSIS — F32A Depression, unspecified: Secondary | ICD-10-CM

## 2021-10-12 MED ORDER — CARIPRAZINE HCL 1.5 MG PO CAPS: ORAL_CAPSULE | ORAL | 1 refills | Status: DC

## 2021-10-12 NOTE — Telephone Encounter (Signed)
Next visit is 11/06/21. Requesting refill on Vraylar 1.5 mg called to:  [id number] - [place], [address] [place] AT [address]  Phone:  [phone]  Fax:  [phone]

## 2021-10-12 NOTE — Telephone Encounter (Signed)
Rx sent 

## 2021-10-14 ENCOUNTER — Telehealth: Payer: Self-pay

## 2021-10-14 NOTE — Telephone Encounter (Signed)
Prior Authorization submitted and approved for VRAYLAR 1.5 MG effective 10/14/2021-10/13/2022 with [employer]

## 2021-10-18 ENCOUNTER — Telehealth: Payer: Self-pay | Admitting: Psychiatry

## 2021-10-18 NOTE — Telephone Encounter (Signed)
Would you please call her pharmacy and ask if that was the cost before or after the PA was approved and if there is a reason they are not accepting the copay savings card? She has private insurance, so I am not aware of an exclusion for her. She has tried multiple medications and has seen some improvement with Vraylar and would prefer not to switch meds if it is due to a possible misunderstanding or miscommunication about cost. Thanks.

## 2021-10-18 NOTE — Telephone Encounter (Signed)
Please advise.A PA has been done so it may be the deductible that costs so much

## 2021-10-18 NOTE — Telephone Encounter (Signed)
Next visit is 11/06/21. Amanda [name] said her Arman Filter cost $200 with her insurance. She states she cannot afford to pay this. Pharmacy will not take a discount card either. Is there anything else cheaper than she can take? Phone number is [phone].

## 2021-10-19 NOTE — Telephone Encounter (Signed)
That is the cost after the PA insurance wants her to pay $180.The person I spoke to  stated she does not have the coupon so she can't tell why it was not accepted,but stated some won't work due to [employer]

## 2021-10-19 NOTE — Telephone Encounter (Signed)
Spoke to Amanda [name].The pharmacy told her [place] won't allow a co pay card if they are paying part of the medication cost.The cost is for a 2 month supply because she is taking it everyday and she is considering paying this since it's helpful.She said you sent something to a mail order pharmacy before and made her meds cheaper she wants to know if that is an option this time.

## 2021-10-19 NOTE — Telephone Encounter (Signed)
Addendum: The cost is for a 2 month supply because she is taking it every other day

## 2021-10-22 NOTE — Telephone Encounter (Signed)
She reports that she recently paid $180 for prescription because it has been helpful and is a 56-monthsupply. Discussed other strategies to obtain med at lower cost. Will discuss further at next visit.

## 2021-10-24 ENCOUNTER — Encounter: Payer: Self-pay | Admitting: Pharmacist

## 2021-10-24 ENCOUNTER — Ambulatory Visit: Payer: BC Managed Care – PPO | Attending: Internal Medicine | Admitting: Pharmacist

## 2021-10-24 VITALS — BP 116/80 | HR 71

## 2021-10-24 DIAGNOSIS — I10 Essential (primary) hypertension: Secondary | ICD-10-CM | POA: Diagnosis not present

## 2021-10-24 MED ORDER — AMLODIPINE BESYLATE 2.5 MG PO TABS: 2.5000 mg | ORAL_TABLET | Freq: Every day | ORAL | 3 refills | Status: DC

## 2021-10-24 NOTE — Patient Instructions (Addendum)
Start taking amlodipine 2.'5mg'$  daily Continue taking lisinopril 10 mg daily, carvedilol 25 mg twice a day Continue checking blood pressure at home. Please bring in readings and cuff to next visit

## 2021-10-24 NOTE — Progress Notes (Signed)
Patient ID: [name]                 DOB: [birth year]                      MRN: [id number]      HPI: Amanda [name] is a 61 y.o. female referred by Dr. Angelena Form to HTN clinic. PMH is significant for HTN, HLD, palpitations (PACs/PVCs), CKD3, asthma, GERD, and anxiety/depression. Admitted 3/20-3/22/23 following syncopal episode likely from dehydration (hyponatremia Na 132, hypokalemia K 3.4) with mild worsening of renal function. Stopped HCTZ and started hydralazine for BP control after admission. At time of syncopal episode in March, pt was doing [place] diet (drinking 80 oz water/day, using diet protein packets, cooking one lean and green meal/day). After discharge Dr. Angelena Form increased metoprolol to 50 mg BID and referred to HTN clinic after patient sent a message that her BP was still elevated to 160/86 after this change. BP was 124/64 when seen by HTN clinic 06/12/21 but patient reported nausea and TID dosing with hydralazine was inhibiting QoL and requested change. Hydralazine was discontinued and valsartan initiated after clarifying that patient has only ever experienced dry cough (no swelling) with ACEi and patient had no memory of trying losartan in the past. Metoprolol was also switched to equivalent dose of carvedilol.   At last visit on 6/13 BP was 136/90. Carvedilol was increased to '25mg'$  twice a day. Valsartan dose was not increased due to a increase in scr.   Pt seen 08/08/21 for HTN follow up. She reported fatigue. Pt's BP technique with home cuff was incorrect with cuff placement over elbow. Pt was upset that medication was making her feel poorly and BP not controlled. Home cuff readings 144-155/95-100 in office, and manual BP 144/89. Pt reported tolerating valsartan. Noted fatigue could be from carvedilol, cymbalta or latuda which she was weaning off of due to nausea/fidgeting/weight gain. Lisinopril caused cough in past but was willing to retry and HCTZ was also effective but ended up  in hospital dehydration, electrolyte abnormalities and AKI. With SNRI would be concerned about hyponatremia. BMET was checked to assess Scr normalization (bumped with valsartan start). BMET was stable, amlodipine 2.5 mg daily was added.   At last visit, patient wished to switch from valsartan to lisinopril. Lisinopril '10mg'$  daily was started. Follow up BMP was stable. Patient thought amlodipine was causing fatigue so it was stopped.   She presents today to clinic for follow up. Reports lack of energy, not any better than when on amlodipine. States she has some dizziness when standing up. BP tends to be higher later in the day per pt report.  She has discussed her fatigue with her Physiologist.   Current HTN meds: lisinopril 10 mg daily, carvedilol 25 mg BID Previously tried: lisinopril (cough), losartan (cough), felodipine (increased flushing, malaise), HCTZ (syncope, worsening Scr that improved with discontinuation), metoprolol (switched to carvedilol for better BP lowering), hydralazine (nausea, abdominal pain) BP goal: <130/80 mmHg  Family History: HLD in mother/father/sister, CAD s/p CABG in father, HTN in sister  Social History: Former smoker (2.5 pack year history, quit 1989). Drinks alcohol occasionally in social settings. Denies drug use.   Diet: Tries not to add salt to foods. Drinks 1-2 cokes per day. Tries to avoid fried foods. Eats a lot of chicken, fish.   Exercise: Walking 1 mile sometimes, recently doesn't have the energy for it  Home BP readings:  118/86, 126/85, 137/84, 140/91, 124/94, 114/82,  125/87, 125/80, 128/88, 125/86, 130/86, 143/94, 145/93, 158/97, 151/99, 135/95, 119/85  Wt Readings from Last 3 Encounters:  04/30/21 150 lb (68 kg)  04/27/21 151 lb (68.5 kg)  04/18/21 148 lb 2.4 oz (67.2 kg)   BP Readings from Last 3 Encounters:  09/20/21 138/82  08/08/21 (!) 144/89  07/10/21 136/90   Pulse Readings from Last 3 Encounters:  09/20/21 79  08/08/21 76  07/10/21  83   Renal function: CrCl cannot be calculated (Unknown ideal weight.).  Past Medical History:  Diagnosis Date   Anxiety    Asthma    Chronic kidney disease    Chronic kidney disease, stage 3 ([place]) 08/10/2019   Depression    Hyperlipidemia    Hypertension    Migraine    Current Outpatient Medications on File Prior to Visit  Medication Sig Dispense Refill   [START ON 12/04/2021] ALPRAZolam (XANAX) 1 MG tablet TAKE 1 TABLET AT BEDTIME AND 1/2-1 TABLET AS NEEDED FOR ANXIETY. 45 tablet 4   cariprazine (VRAYLAR) 1.5 MG capsule Take 1 capsule every other day for depression 30 capsule 1   carvedilol (COREG) 25 MG tablet Take 1 tablet (25 mg total) by mouth 2 (two) times daily with a meal. 60 tablet 5   Cholecalciferol (VITAMIN D3) 50 MCG (2000 UT) capsule Take 1 tablet by mouth every morning.     Cyanocobalamin (VITAMIN B-12 PO) Take 1 tablet by mouth every morning.     DULoxetine (CYMBALTA) 30 MG capsule Take 1 capsule (30 mg total) by mouth daily. Take with a 60 mg capsule to equal total dose of 90 mg. 90 capsule 0   DULoxetine (CYMBALTA) 60 MG capsule Take 1 capsule (60 mg total) by mouth daily. Take with a 30 mg capsule to equal total dose of 90 mg. 90 capsule 0   esomeprazole (NEXIUM) 20 MG capsule Take 20 mg by mouth as needed.     lisinopril (ZESTRIL) 10 MG tablet Take 1 tablet (10 mg total) by mouth daily. 90 tablet 3   REPATHA SURECLICK 456 MG/ML SOAJ INJECT 1 PEN INTO THE SKIN EVERY 14 DAYS (Patient taking differently: Inject 140 mg into the skin every 14 (fourteen) days. Every other Monday) 2 mL 11   rizatriptan (MAXALT) 10 MG tablet Take 10 mg by mouth See admin instructions. Take one tablet (10 mg) by mouth at onset of migraine headache, may repeat in 2 hours if still needed.     zaleplon (SONATA) 10 MG capsule Take 1 capsule (10 mg total) by mouth at bedtime as needed for sleep. 30 capsule 5   No current facility-administered medications on file prior to visit.   Allergies   Allergen Reactions   Ace Inhibitors Cough   Ambien [Zolpidem] Other (See Comments)    Other reaction(s): headache/neck throbbing   Amoxicillin Other (See Comments)    Gi upset   Codeine Nausea And Vomiting    Can tolerate hydrocodone if has food on stomach     Cortisone Other (See Comments)    Extremely high BP- reaction to injection   Felodipine Other (See Comments)     increased flushing and made her feel bad   Hydralazine     nausea   Hydrocodone-Acetaminophen Nausea And Vomiting    Must eat before and after each dose   Lisinopril Cough    Dry cough   Losartan Potassium Cough   Nexletol [Bempedoic Acid] Other (See Comments)    Brain fog   Venlafaxine Other (See Comments)  headaches/elevated blood pressure   Zetia [Ezetimibe] Diarrhea   Crestor [Rosuvastatin] Rash    facial rash   Lamictal [Lamotrigine] Rash   Statins Rash    Face and leg rash with rosuvastatin, pravastatin, and atorvastatin   Assessment/Plan:  1. Hypertension - BP of 116/80 is very close to goal of <130/80. Her home blood pressure vary some. Systolic BP looks much better and is at goal the majority of the time. Her diastolic BP readings are high. Although her home cuff runs about 8-10 points higher for diastolic, they are still above goal. We discussed re challenging with amlodipine since fatigue did not improve vs increasing lisinopril. Will re-challenge with amlodipine 2.'5mg'$  daily. Continue lisinopril '10mg'$  daily and carvedilol '25mg'$  twice a day. Follow up in 4-6 weeks.   Thank you,  Ramond Dial, Pharm.D, BCPS, CPP Amanda [name] [facility] of [facility] [address] [place], [place] [address]  Phone: [phone]; Fax: [phone]

## 2021-11-06 ENCOUNTER — Encounter: Payer: Self-pay | Admitting: Psychiatry

## 2021-11-06 ENCOUNTER — Ambulatory Visit: Payer: BC Managed Care – PPO | Admitting: Psychiatry

## 2021-11-06 DIAGNOSIS — F419 Anxiety disorder, unspecified: Secondary | ICD-10-CM

## 2021-11-06 DIAGNOSIS — F32A Depression, unspecified: Secondary | ICD-10-CM

## 2021-11-06 DIAGNOSIS — F5104 Psychophysiologic insomnia: Secondary | ICD-10-CM

## 2021-11-06 MED ORDER — CARIPRAZINE HCL 1.5 MG PO CAPS: ORAL_CAPSULE | ORAL | 1 refills | Status: DC

## 2021-11-06 NOTE — Progress Notes (Signed)
Amanda [name] [id number] 06-14-1960 61 y.o.  Subjective:   Patient ID:  Amanda [name] is a 61 y.o. (DOB 09-02-1960) female.  Chief Complaint:  Chief Complaint  Patient presents with   Depression   Anxiety    Depression        Past medical history includes anxiety.   Anxiety     Amanda [name] presents to the office today for follow-up of depression, anxiety, and insomnia. She reports, "I'm definitely better than I was months ago." She reports, "I'm ok... last week had a few bad days... this week I feel pretty good." She reports that she had some depression and felt "angry... lonely" during the bad days and did not want to get out of bed. She reports that anger was in response to re-experiencing depression and BP being elevated. Denies any other issues with irritability. She reports that she was able to tour a college with her daughter this weekend and was gone throughout the day. She goes to all of her daughter's game.   She reports poor concentration. Has been going into the office about once a week. Doing some work from home other days, "but not every day." She reports that her motivation has been low. Has not been wanting to walk with her wife. Motivation is low for hygiene and projects around the house. She tries to do a load of laundry a day and in the past would do 3 loads in one day if she were home. They have house professionally cleaned every other week. She reports that she has some anxiety, "and I think that's just me." She reports that she can control her anxiety "a little bit better" than she has in the past. Denies panic.   She reports that akathisia has improved and does not notice it as much. Notices some restless just in her legs, typically at night when trying to fall asleep. Some occ difficulty falling asleep and usually able to stay asleep once she falls asleep. Appetite has been good. Has been enjoying football and time with family. Denies SI.   Completed TMS  treatments.   Daughter and wife are supportive. Daughter wants to become a Company secretary and/or mission work. Daughter is a Paramedic in high school.   She has been taking Xanax every night at bedtime and then 1/2-1 tab during the daytime.   Past Psychiatric Medication Trials: Wellbutrin XL- No improvement at 300 mg dose. Trintellix Lexapro- "felt the best on." Reports that she had some hair loss Zoloft Celexa Viibryd Prozac Effexor Remeron Auvelity- Ineffective Ambien-Effective and then no longer effective. Sleep was not restful. Parasomnias. Lunesta-Ineffective Belsomra-Ineffective Trazodone-ineffective.  Doxepin-Ineffective Klonopin xanax Gabapentin Lamictal- Rash Abilify Rexulti- Akathisia. No benefit Latuda- Akathisia, clinching teeth, nausea, food cravings Deplin    AIMS    [place] Office Visit from 11/06/2021 in [facility] Visit from 10/09/2021 in [facility] Total Score 0 0      Flowsheet Row ED from 04/30/2021 in [facility] ED to Hosp-Admission (Discharged) from 04/16/2021 in [facility] to Hosp-Admission (Discharged) from 03/21/2020 in [facility]  C-SSRS RISK CATEGORY No Risk No Risk No Risk        Review of Systems:  Review of Systems  Cardiovascular:        She reports improved BP since addition of Amlodipine. Notices some orthostatic hypotension.   Musculoskeletal:  Negative for gait  problem.  Neurological:  Negative for tremors.  Psychiatric/Behavioral:  Positive for depression.        Please refer to HPI     Reports BP has improved with new med  Medications: I have reviewed the patient's current medications.  Current Outpatient Medications  Medication Sig Dispense Refill   [START ON 12/04/2021] ALPRAZolam (XANAX) 1 MG tablet TAKE 1 TABLET AT BEDTIME AND 1/2-1 TABLET AS NEEDED FOR ANXIETY. 45 tablet 4    amLODipine (NORVASC) 2.5 MG tablet Take 1 tablet (2.5 mg total) by mouth daily. 90 tablet 3   carvedilol (COREG) 25 MG tablet Take 1 tablet (25 mg total) by mouth 2 (two) times daily with a meal. 60 tablet 5   Cholecalciferol (VITAMIN D3) 50 MCG (2000 UT) capsule Take 1 tablet by mouth every morning.     Cyanocobalamin (VITAMIN B-12 PO) Take 1 tablet by mouth every morning.     DULoxetine (CYMBALTA) 30 MG capsule Take 1 capsule (30 mg total) by mouth daily. Take with a 60 mg capsule to equal total dose of 90 mg. 90 capsule 0   DULoxetine (CYMBALTA) 60 MG capsule Take 1 capsule (60 mg total) by mouth daily. Take with a 30 mg capsule to equal total dose of 90 mg. 90 capsule 0   esomeprazole (NEXIUM) 20 MG capsule Take 20 mg by mouth as needed.     lisinopril (ZESTRIL) 10 MG tablet Take 1 tablet (10 mg total) by mouth daily. 90 tablet 3   REPATHA SURECLICK 426 MG/ML SOAJ INJECT 1 PEN INTO THE SKIN EVERY 14 DAYS (Patient taking differently: Inject 140 mg into the skin every 14 (fourteen) days. Every other Monday) 2 mL 11   zaleplon (SONATA) 10 MG capsule Take 1 capsule (10 mg total) by mouth at bedtime as needed for sleep. 30 capsule 5   cariprazine (VRAYLAR) 1.5 MG capsule Take 1 capsule every other day for depression 30 capsule 1   rizatriptan (MAXALT) 10 MG tablet Take 10 mg by mouth See admin instructions. Take one tablet (10 mg) by mouth at onset of migraine headache, may repeat in 2 hours if still needed.     No current facility-administered medications for this visit.    Medication Side Effects: Other: Restless legs  Allergies:  Allergies  Allergen Reactions   Ace Inhibitors Cough   Ambien [Zolpidem] Other (See Comments)    Other reaction(s): headache/neck throbbing   Amoxicillin Other (See Comments)    Gi upset   Codeine Nausea And Vomiting    Can tolerate hydrocodone if has food on stomach     Cortisone Other (See Comments)    Extremely high BP- reaction to injection    Felodipine Other (See Comments)     increased flushing and made her feel bad   Hydralazine     nausea   Hydrocodone-Acetaminophen Nausea And Vomiting    Must eat before and after each dose   Lisinopril Cough    Dry cough   Losartan Potassium Cough   Nexletol [Bempedoic Acid] Other (See Comments)    Brain fog   Venlafaxine Other (See Comments)    headaches/elevated blood pressure   Zetia [Ezetimibe] Diarrhea   Crestor [Rosuvastatin] Rash    facial rash   Lamictal [Lamotrigine] Rash   Statins Rash    Face and leg rash with rosuvastatin, pravastatin, and atorvastatin    Past Medical History:  Diagnosis Date   Anxiety    Asthma    Chronic kidney disease  Chronic kidney disease, stage 3 ([place]) 08/10/2019   Depression    Hyperlipidemia    Hypertension    Migraine     Past Medical History, Surgical history, Social history, and Family history were reviewed and updated as appropriate.   Please see review of systems for further details on the patient's review from today.   Objective:   Physical Exam:  LMP 03/23/2011   Physical Exam Constitutional:      General: She is not in acute distress. Musculoskeletal:        General: No deformity.  Neurological:     Mental Status: She is alert and oriented to person, place, and time.     Coordination: Coordination normal.  Psychiatric:        Attention and Perception: Attention and perception normal. She does not perceive auditory or visual hallucinations.        Mood and Affect: Mood is depressed. Affect is not labile, blunt, angry or inappropriate.        Speech: Speech normal.        Behavior: Behavior normal.        Thought Content: Thought content normal. Thought content is not paranoid or delusional. Thought content does not include homicidal or suicidal ideation. Thought content does not include homicidal or suicidal plan.        Cognition and Memory: Cognition and memory normal.        Judgment: Judgment normal.      Comments: Insight intact Mood presents as less anxious     Lab Review:     Component Value Date/Time   NA 143 10/05/2021 1241   K 4.6 10/05/2021 1241   CL 105 10/05/2021 1241   CO2 23 10/05/2021 1241   GLUCOSE 103 (H) 10/05/2021 1241   GLUCOSE 119 (H) 04/30/2021 1234   BUN 11 10/05/2021 1241   CREATININE 1.01 (H) 10/05/2021 1241   CALCIUM 10.0 10/05/2021 1241   PROT 7.6 04/30/2021 1234   PROT 6.6 07/03/2020 1128   ALBUMIN 4.2 04/30/2021 1234   ALBUMIN 3.9 07/03/2020 1128   AST 22 04/30/2021 1234   ALT 26 04/30/2021 1234   ALKPHOS 64 04/30/2021 1234   BILITOT 0.4 04/30/2021 1234   BILITOT 0.5 07/03/2020 1128   GFRNONAA >60 04/30/2021 1234   GFRAA 38 (L) 11/03/2019 1107       Component Value Date/Time   WBC 11.3 (H) 04/30/2021 1234   RBC 4.58 04/30/2021 1234   HGB 13.9 04/30/2021 1234   HCT 41.2 04/30/2021 1234   PLT 385 04/30/2021 1234   MCV 90.0 04/30/2021 1234   MCH 30.3 04/30/2021 1234   MCHC 33.7 04/30/2021 1234   RDW 12.5 04/30/2021 1234   LYMPHSABS 3.3 04/18/2021 0415   MONOABS 0.7 04/18/2021 0415   EOSABS 0.1 04/18/2021 0415   BASOSABS 0.1 04/18/2021 0415    No results found for: "POCLITH", "LITHIUM"   No results found for: "PHENYTOIN", "PHENOBARB", "VALPROATE", "CBMZ"   .res Assessment: Plan:    Pt seen for 30 minutes and time spent discussing recent anxiety and depressive symptoms. She reports that side effects have improved with Vraylar. Discussed option of slightly increasing Vraylar frequency since akathisia has improved. She reports that she prefers to continue Vraylar 1.5 mg every other day for depression.  She reports that she would like to continue current medications without changes at this time.  Will continue Vraylar 1.5 mg every other day for depression.  Will continue Cymbalta 90 mg daily for anxiety and depression. Discussed  option of considering increase of Cymbalta to 120 mg daily to improve anxiety and depression. Discussed that doses  above 60 mg are off-label.  Continue Sonata 10 mg at bedtime as needed for insomnia.  Continue Xanax 1 mg at bedtime and 1/2-1 tab po qd prn anxiety.  Pt to follow-up with this provider in 4-6 weeks or sooner if clinically indicated.  Patient advised to contact office with any questions, adverse effects, or acute worsening in signs and symptoms.  Aleesia was seen today for depression and anxiety.  Diagnoses and all orders for this visit:  Depression, unspecified depression type -     cariprazine (VRAYLAR) 1.5 MG capsule; Take 1 capsule every other day for depression  Chronic insomnia  Anxiety     Please see After Visit Summary for patient specific instructions.  Future Appointments  Date Time Provider [place]  11/29/2021  9:30 AM [facility] PHARMACIST [facility]  12/11/2021 10:00 AM Thayer Headings, PMHNP [facility] None    No orders of the defined types were placed in this encounter.   -------------------------------

## 2021-11-12 DIAGNOSIS — Z23 Encounter for immunization: Secondary | ICD-10-CM | POA: Diagnosis not present

## 2021-11-12 DIAGNOSIS — I129 Hypertensive chronic kidney disease with stage 1 through stage 4 chronic kidney disease, or unspecified chronic kidney disease: Secondary | ICD-10-CM | POA: Diagnosis not present

## 2021-11-12 DIAGNOSIS — E559 Vitamin D deficiency, unspecified: Secondary | ICD-10-CM | POA: Diagnosis not present

## 2021-11-12 DIAGNOSIS — E782 Mixed hyperlipidemia: Secondary | ICD-10-CM | POA: Diagnosis not present

## 2021-11-12 DIAGNOSIS — G43909 Migraine, unspecified, not intractable, without status migrainosus: Secondary | ICD-10-CM | POA: Diagnosis not present

## 2021-11-12 DIAGNOSIS — N1832 Chronic kidney disease, stage 3b: Secondary | ICD-10-CM | POA: Diagnosis not present

## 2021-11-28 DIAGNOSIS — F332 Major depressive disorder, recurrent severe without psychotic features: Secondary | ICD-10-CM | POA: Diagnosis not present

## 2021-11-29 ENCOUNTER — Ambulatory Visit: Payer: BC Managed Care – PPO | Attending: Cardiology | Admitting: Pharmacist

## 2021-11-29 VITALS — BP 120/76 | HR 83

## 2021-11-29 DIAGNOSIS — I10 Essential (primary) hypertension: Secondary | ICD-10-CM

## 2021-11-29 NOTE — Progress Notes (Signed)
Patient ID: Amanda [name]                 DOB: [birth year]                      MRN: [id number]      HPI: Amanda [name] is a 61 y.o. female referred by Dr. Angelena Form to HTN clinic. PMH is significant for HTN, HLD, palpitations (PACs/PVCs), CKD3, asthma, GERD, and anxiety/depression. Admitted 3/20-3/22/23 following syncopal episode likely from dehydration (hyponatremia Na 132, hypokalemia K 3.4) with mild worsening of renal function. Stopped HCTZ and started hydralazine for BP control after admission. At time of syncopal episode in March, pt was doing [place] diet (drinking 80 oz water/day, using diet protein packets, cooking one lean and green meal/day). After discharge Dr. Angelena Form increased metoprolol to 50 mg BID and referred to HTN clinic after patient sent a message that her BP was still elevated to 160/86 after this change. BP was 124/64 when seen by HTN clinic 06/12/21 but patient reported nausea and TID dosing with hydralazine was inhibiting QoL and requested change. Hydralazine was discontinued and valsartan initiated after clarifying that patient has only ever experienced dry cough (no swelling) with ACEi and patient had no memory of trying losartan in the past. Metoprolol was also switched to equivalent dose of carvedilol.   At last visit on 6/13 BP was 136/90. Carvedilol was increased to '25mg'$  twice a day. Valsartan dose was not increased due to a increase in scr. She was eventually switched to lisinopril as patient stated it was the only thing that ever worked for her. She was willing to live with the cough. BMP was stable. At last visit amlodipine 2.'5mg'$  was resumed as patient fatigue did not improve off of amlodipine.   Of note, patient wishes to retry HCTZ- in the past it was thought to have caused dehydration and hyponatremia. Patient convinced her syncopal episode was from drinking too much water. With SNRI she is at high risk for hyponatremia.   Patient presents today to clinic. She  states she has dizziness occasionally when standing up. She has headaches daily in the AM. Takes 2 Advil and it goes away. States APAP upsets her stomach. Her energy level is the same. Not any worse after resuming amlodipine. Has been walking some, forcing herself to go and then she feels better after walking. Home blood pressures are better. She has a new machine and brings it in today.   120/76 clinic 123/81 home  Still seeing her psychologist and psychiatrist.  Current HTN meds: lisinopril 10 mg daily, carvedilol 25 mg BID, amlodipine 2.'5mg'$  daily Previously tried: lisinopril (cough), losartan (cough), felodipine (increased flushing, malaise), HCTZ (syncope, worsening Scr that improved with discontinuation), metoprolol (switched to carvedilol for better BP lowering), hydralazine (nausea, abdominal pain) BP goal: <130/80 mmHg  Family History: HLD in mother/father/sister, CAD s/p CABG in father, HTN in sister  Social History: Former smoker (2.5 pack year history, quit 1989). Drinks alcohol occasionally in social settings. Denies drug use.   Diet: Tries not to add salt to foods. Drinks 1-2 cokes per day. Tries to avoid fried foods. Eats a lot of chicken, fish.   Exercise: Walking 1 mile sometimes, recently doesn't have the energy for it  Home BP readings: 124/83, 136/86, 128/86, 119/85, 120/83, 127/96, 106/77, 130/90, 126/86, 129/91   Wt Readings from Last 3 Encounters:  04/30/21 150 lb (68 kg)  04/27/21 151 lb (68.5 kg)  04/18/21 148 lb 2.4  oz (67.2 kg)   BP Readings from Last 3 Encounters:  10/24/21 116/80  09/20/21 138/82  08/08/21 (!) 144/89   Pulse Readings from Last 3 Encounters:  10/24/21 71  09/20/21 79  08/08/21 76   Renal function: CrCl cannot be calculated (Patient's most recent lab result is older than the maximum 21 days allowed.).  Past Medical History:  Diagnosis Date   Anxiety    Asthma    Chronic kidney disease    Chronic kidney disease, stage 3 ([place])  08/10/2019   Depression    Hyperlipidemia    Hypertension    Migraine    Current Outpatient Medications on File Prior to Visit  Medication Sig Dispense Refill   [START ON 12/04/2021] ALPRAZolam (XANAX) 1 MG tablet TAKE 1 TABLET AT BEDTIME AND 1/2-1 TABLET AS NEEDED FOR ANXIETY. 45 tablet 4   amLODipine (NORVASC) 2.5 MG tablet Take 1 tablet (2.5 mg total) by mouth daily. 90 tablet 3   cariprazine (VRAYLAR) 1.5 MG capsule Take 1 capsule every 1-2 days 30 capsule 1   carvedilol (COREG) 25 MG tablet Take 1 tablet (25 mg total) by mouth 2 (two) times daily with a meal. 60 tablet 5   Cholecalciferol (VITAMIN D3) 50 MCG (2000 UT) capsule Take 1 tablet by mouth every morning.     Cyanocobalamin (VITAMIN B-12 PO) Take 1 tablet by mouth every morning.     DULoxetine (CYMBALTA) 30 MG capsule Take 1 capsule (30 mg total) by mouth daily. Take with a 60 mg capsule to equal total dose of 90 mg. 90 capsule 0   DULoxetine (CYMBALTA) 60 MG capsule Take 1 capsule (60 mg total) by mouth daily. Take with a 30 mg capsule to equal total dose of 90 mg. 90 capsule 0   esomeprazole (NEXIUM) 20 MG capsule Take 20 mg by mouth as needed.     lisinopril (ZESTRIL) 10 MG tablet Take 1 tablet (10 mg total) by mouth daily. 90 tablet 3   REPATHA SURECLICK 765 MG/ML SOAJ INJECT 1 PEN INTO THE SKIN EVERY 14 DAYS (Patient taking differently: Inject 140 mg into the skin every 14 (fourteen) days. Every other Monday) 2 mL 11   rizatriptan (MAXALT) 10 MG tablet Take 10 mg by mouth See admin instructions. Take one tablet (10 mg) by mouth at onset of migraine headache, may repeat in 2 hours if still needed.     zaleplon (SONATA) 10 MG capsule Take 1 capsule (10 mg total) by mouth at bedtime as needed for sleep. 30 capsule 5   No current facility-administered medications on file prior to visit.   Allergies  Allergen Reactions   Ace Inhibitors Cough   Ambien [Zolpidem] Other (See Comments)    Other reaction(s): headache/neck throbbing    Amoxicillin Other (See Comments)    Gi upset   Codeine Nausea And Vomiting    Can tolerate hydrocodone if has food on stomach     Cortisone Other (See Comments)    Extremely high BP- reaction to injection   Felodipine Other (See Comments)     increased flushing and made her feel bad   Hydralazine     nausea   Hydrocodone-Acetaminophen Nausea And Vomiting    Must eat before and after each dose   Lisinopril Cough    Dry cough   Losartan Potassium Cough   Nexletol [Bempedoic Acid] Other (See Comments)    Brain fog   Venlafaxine Other (See Comments)    headaches/elevated blood pressure   Zetia [  Ezetimibe] Diarrhea   Crestor [Rosuvastatin] Rash    facial rash   Lamictal [Lamotrigine] Rash   Statins Rash    Face and leg rash with rosuvastatin, pravastatin, and atorvastatin   Assessment/Plan:  1. [place]pertension [place] [address] is at goal of <130/80. Home blood pressures are pretty much at goal for systolic and a little above goal for diastolic. Her machine runs about 5 points higher for diastolic. She is resting about 3 min. I have asked her to rest 10 min before checking and make sure cuff is snug around her arm- can barely fit 2 fingers underneath. Encouraged her to walk daily to help with blood pressure and energy level. Continue lisinopril 10 mg daily, carvedilol 25 mg BID, amlodipine 2.'5mg'$  daily. Follow up in 6 weeks.  Thank you,  Ramond Dial, Pharm.D, BCPS, CPP [facility] [address] [place], [place] [address]  Phone: [phone]; Fax: [phone]

## 2021-11-29 NOTE — Patient Instructions (Signed)
Summary of today's discussion  1.Continue lisinopril 10 mg daily, carvedilol 25 mg twice a day and amlodipine 2.'5mg'$  daily  2. Try to get in the habit of walking daily- push yourself to do it and you will feel better  3. Rest 10 min before checking blood pressure  4. Make sure cuff is tight enough that you can just barely get 2 fingers under cuff  5.   Your blood pressure goal is <130/80  To check your pressure at home you will need to:  1. Sit up in a chair, with feet flat on the floor and back supported. Do not cross your ankles or legs. 2. Rest your left arm so that the cuff is about heart level. If the cuff goes on your upper arm,  then just relax the arm on the table, arm of the chair or your lap. If you have a wrist cuff, we  suggest relaxing your wrist against your chest (think of it as Pledging the Flag with the  wrong arm).  3. Place the cuff snugly around your arm, about 1 inch above the crook of your elbow. The  cords should be inside the groove of your elbow.  4. Sit quietly, with the cuff in place, for about 5 minutes. After that 5 minutes press the power  button to start a reading. 5. Do not talk or move while the reading is taking place.  6. Record your readings on a sheet of paper. Although most cuffs have a memory, it is often  easier to see a pattern developing when the numbers are all in front of you.  7. You can repeat the reading after 1-3 minutes if it is recommended  Make sure your bladder is empty and you have not had caffeine or tobacco within the last 30 min  Always bring your blood pressure log with you to your appointments. If you have not brought your monitor in to be double checked for accuracy, please bring it to your next appointment.  You can find a list of validated (accurate) blood pressure cuffs at [employer]it   Important lifestyle changes to control high blood pressure  Intervention  Effect on the BP  Lose extra pounds and watch your  waistline Weight loss is one of the most effective lifestyle changes for controlling blood pressure. If you're overweight or obese, losing even a small amount of weight can help reduce blood pressure. Blood pressure might go down by about 1 millimeter of mercury (mm Hg) with each kilogram (about 2.2 pounds) of weight lost.  Exercise regularly As a general goal, aim for at least 30 minutes of moderate physical activity every day. Regular physical activity can lower high blood pressure by about 5 to 8 mm Hg.  Eat a healthy diet Eating a diet rich in whole grains, fruits, vegetables, and low-fat dairy products and low in saturated fat and cholesterol. A healthy diet can lower high blood pressure by up to 11 mm Hg.  Reduce salt (sodium) in your diet Even a small reduction of sodium in the diet can improve heart health and reduce high blood pressure by about 5 to 6 mm Hg.  Limit alcohol One drink equals 12 ounces of beer, 5 ounces of wine, or 1.5 ounces of 80-proof liquor.  Limiting alcohol to less than one drink a day for women or two drinks a day for men can help lower blood pressure by about 4 mm Hg.   Please call me at [phone] with  any questions.

## 2021-12-11 ENCOUNTER — Encounter: Payer: Self-pay | Admitting: Psychiatry

## 2021-12-11 ENCOUNTER — Ambulatory Visit: Payer: BC Managed Care – PPO | Admitting: Psychiatry

## 2021-12-11 VITALS — Wt 156.0 lb

## 2021-12-11 DIAGNOSIS — F419 Anxiety disorder, unspecified: Secondary | ICD-10-CM | POA: Diagnosis not present

## 2021-12-11 DIAGNOSIS — F32A Depression, unspecified: Secondary | ICD-10-CM

## 2021-12-11 DIAGNOSIS — F5104 Psychophysiologic insomnia: Secondary | ICD-10-CM | POA: Diagnosis not present

## 2021-12-11 MED ORDER — DULOXETINE HCL 30 MG PO CPEP: 30.0000 mg | ORAL_CAPSULE | Freq: Every day | ORAL | 0 refills | Status: DC

## 2021-12-11 MED ORDER — LITHIUM CARBONATE 150 MG PO CAPS: ORAL_CAPSULE | ORAL | 1 refills | Status: DC

## 2021-12-11 MED ORDER — DULOXETINE HCL 60 MG PO CPEP: 60.0000 mg | ORAL_CAPSULE | Freq: Every day | ORAL | 0 refills | Status: DC

## 2021-12-11 NOTE — Progress Notes (Unsigned)
Amanda [name] [id number] 04/08/1960 61 y.o.  Subjective:   Patient ID:  Amanda [name] is a 61 y.o. (DOB [birth year]) female.  Chief Complaint: No chief complaint on file.   HPI Amanda [name] presents to the office today for follow-up of *** "Ok, about the same." She reports that she is concerned about weight gain. She reports gaining 13 lbs since March. She reports gaining more weight over the last couple of months. "I don't want to get out and do things." She reports that she does not have the motivation to get up and do different activities.   She reports that she usually has more depression around the holidays and is concerned about the upcoming holidays. She reports that she has been "getting out more but it is because my wife and daughter are pushing me." They toured a college campus and went to a game. She reports that she is enjoying things once she gets out. She has been trying to walk every 1-2 days since obgyn talked with her about weight gain.   Denies persistent sad mood. She reports some irritability. She has cut back on soft drinks and thinks this may be causing some irritability. Has gone from 3 soft drinks to one a a day. She reports that her anxiety has been manageable. Some anxiety about the holidays coming up. No change in appetite. She reports, "I think I eat less and snack less." Sleep has been "off and on." Concentration is "not great." Working only one day a week due to impaired concentration and low motivation and energy, to include not wanting to go into the office. Denies SI.   She reports that she notices some slight restlessness the days she takes Dietitian.   Has been seeing a therapist.   Past Psychiatric Medication Trials: Wellbutrin XL- No improvement at 300 mg dose. Trintellix Lexapro- "felt the best on." Reports that she had some hair loss Zoloft Celexa Viibryd Prozac Effexor Remeron Auvelity- Ineffective Ambien-Effective and then no longer  effective. Sleep was not restful. Parasomnias. Lunesta-Ineffective Belsomra-Ineffective Trazodone-ineffective.  Doxepin-Ineffective Klonopin xanax Gabapentin Lamictal- Rash Abilify Rexulti- Akathisia. No benefit Latuda- Akathisia, clinching teeth, nausea, food cravings [place] [facility] Visit from 11/06/2021 in [place] Visit from 10/09/2021 in [place] Total Score 0 0      PHQ2-9    [place] Office Visit from 12/11/2021 in [facility]  PHQ-2 Total Score 5  PHQ-9 Total Score 14      Flowsheet Row ED from 04/30/2021 in [place] DEPT ED to Hosp-Admission (Discharged) from 04/16/2021 in [place] ED to Hosp-Admission (Discharged) from 03/21/2020 in [facility]  C-SSRS RISK CATEGORY No Risk No Risk No Risk        Review of Systems:  Review of Systems  Cardiovascular:        She reports that her BP has been difficult to manage  Musculoskeletal:  Negative for gait problem.  Neurological:  Negative for tremors.  Psychiatric/Behavioral:         Please refer to HPI    Medications: I have reviewed the patient's current medications.  Current Outpatient Medications  Medication Sig Dispense Refill   ALPRAZolam (XANAX) 1 MG tablet TAKE 1 TABLET AT BEDTIME AND 1/2-1 TABLET AS NEEDED FOR ANXIETY. 45 tablet 4   amLODipine (NORVASC) 2.5 MG tablet Take 1  tablet (2.5 mg total) by mouth daily. 90 tablet 3   cariprazine (VRAYLAR) 1.5 MG capsule Take 1 capsule every 1-2 days 30 capsule 1   carvedilol (COREG) 25 MG tablet Take 1 tablet (25 mg total) by mouth 2 (two) times daily with a meal. 60 tablet 5   Cholecalciferol (VITAMIN D3) 50 MCG (2000 UT) capsule Take 1 tablet by mouth every morning.     Cyanocobalamin (VITAMIN B-12 PO) Take 1 tablet by mouth every morning.     DULoxetine  (CYMBALTA) 30 MG capsule Take 1 capsule (30 mg total) by mouth daily. Take with a 60 mg capsule to equal total dose of 90 mg. 90 capsule 0   DULoxetine (CYMBALTA) 60 MG capsule Take 1 capsule (60 mg total) by mouth daily. Take with a 30 mg capsule to equal total dose of 90 mg. 90 capsule 0   esomeprazole (NEXIUM) 20 MG capsule Take 20 mg by mouth as needed.     lisinopril (ZESTRIL) 10 MG tablet Take 1 tablet (10 mg total) by mouth daily. 90 tablet 3   REPATHA SURECLICK 756 MG/ML SOAJ INJECT 1 PEN INTO THE SKIN EVERY 14 DAYS (Patient taking differently: Inject 140 mg into the skin every 14 (fourteen) days. Every other Monday) 2 mL 11   rizatriptan (MAXALT) 10 MG tablet Take 10 mg by mouth See admin instructions. Take one tablet (10 mg) by mouth at onset of migraine headache, may repeat in 2 hours if still needed.     zaleplon (SONATA) 10 MG capsule Take 1 capsule (10 mg total) by mouth at bedtime as needed for sleep. 30 capsule 5   No current facility-administered medications for this visit.    Medication Side Effects: Other: wt gain and restlessness  Allergies:  Allergies  Allergen Reactions   Ace Inhibitors Cough   Ambien [Zolpidem] Other (See Comments)    Other reaction(s): headache/neck throbbing   Amoxicillin Other (See Comments)    Gi upset   Codeine Nausea And Vomiting    Can tolerate hydrocodone if has food on stomach     Cortisone Other (See Comments)    Extremely high BP- reaction to injection   Felodipine Other (See Comments)     increased flushing and made her feel bad   Hydralazine     nausea   Hydrocodone-Acetaminophen Nausea And Vomiting    Must eat before and after each dose   Lisinopril Cough    Dry cough   Losartan Potassium Cough   Nexletol [Bempedoic Acid] Other (See Comments)    Brain fog   Venlafaxine Other (See Comments)    headaches/elevated blood pressure   Zetia [Ezetimibe] Diarrhea   Crestor [Rosuvastatin] Rash    facial rash   Lamictal  [Lamotrigine] Rash   Statins Rash    Face and leg rash with rosuvastatin, pravastatin, and atorvastatin    Past Medical History:  Diagnosis Date   Anxiety    Asthma    Chronic kidney disease    Chronic kidney disease, stage 3 ([place]) 08/10/2019   Depression    Hyperlipidemia    Hypertension    Migraine     Past Medical History, Surgical history, Social history, and Family history were reviewed and updated as appropriate.   Please see review of systems for further details on the patient's review from today.   Objective:   Physical Exam:  Wt 156 lb (70.8 kg)   LMP 03/23/2011   BMI 28.53 kg/m   Physical Exam  Lab  Review:     Component Value Date/Time   NA 143 10/05/2021 1241   K 4.6 10/05/2021 1241   CL 105 10/05/2021 1241   CO2 23 10/05/2021 1241   GLUCOSE 103 (H) 10/05/2021 1241   GLUCOSE 119 (H) 04/30/2021 1234   BUN 11 10/05/2021 1241   CREATININE 1.01 (H) 10/05/2021 1241   CALCIUM 10.0 10/05/2021 1241   PROT 7.6 04/30/2021 1234   PROT 6.6 07/03/2020 1128   ALBUMIN 4.2 04/30/2021 1234   ALBUMIN 3.9 07/03/2020 1128   AST 22 04/30/2021 1234   ALT 26 04/30/2021 1234   ALKPHOS 64 04/30/2021 1234   BILITOT 0.4 04/30/2021 1234   BILITOT 0.5 07/03/2020 1128   GFRNONAA >60 04/30/2021 1234   GFRAA 38 (L) 11/03/2019 1107       Component Value Date/Time   WBC 11.3 (H) 04/30/2021 1234   RBC 4.58 04/30/2021 1234   HGB 13.9 04/30/2021 1234   HCT 41.2 04/30/2021 1234   PLT 385 04/30/2021 1234   MCV 90.0 04/30/2021 1234   MCH 30.3 04/30/2021 1234   MCHC 33.7 04/30/2021 1234   RDW 12.5 04/30/2021 1234   LYMPHSABS 3.3 04/18/2021 0415   MONOABS 0.7 04/18/2021 0415   EOSABS 0.1 04/18/2021 0415   BASOSABS 0.1 04/18/2021 0415    No results found for: "POCLITH", "LITHIUM"   No results found for: "PHENYTOIN", "PHENOBARB", "VALPROATE", "CBMZ"   .res Assessment: Plan:    Discussed ECT  There are no diagnoses linked to this encounter.   Please see After Visit  Summary for patient specific instructions.  Future Appointments  Date Time Provider [place]  01/09/2022  1:30 PM [facility] PHARMACIST [facility]    No orders of the defined types were placed in this encounter.   -------------------------------

## 2021-12-25 ENCOUNTER — Other Ambulatory Visit: Payer: Self-pay | Admitting: *Deleted

## 2021-12-25 MED ORDER — CARVEDILOL 25 MG PO TABS: 25.0000 mg | ORAL_TABLET | Freq: Two times a day (BID) | ORAL | 5 refills | Status: DC

## 2022-01-09 ENCOUNTER — Ambulatory Visit: Payer: BC Managed Care – PPO | Attending: Cardiovascular Disease | Admitting: Pharmacist

## 2022-01-09 VITALS — BP 120/70

## 2022-01-09 DIAGNOSIS — I10 Essential (primary) hypertension: Secondary | ICD-10-CM | POA: Diagnosis not present

## 2022-01-09 MED ORDER — SPIRONOLACTONE 25 MG PO TABS: 12.5000 mg | ORAL_TABLET | Freq: Every day | ORAL | 3 refills | Status: DC

## 2022-01-09 NOTE — Patient Instructions (Addendum)
STOP lisinopril Start spironolactone 12.'5mg'$  daily Continue carvedilol '25mg'$  twice a day Continue checking blood pressure at home

## 2022-01-09 NOTE — Progress Notes (Signed)
Patient ID: Amanda [name]                 DOB: 1960-05-12                      MRN: [id number]      HPI: Amanda [name] is a 61 y.o. female referred by Dr. Angelena Form to HTN clinic. PMH is significant for HTN, HLD, palpitations (PACs/PVCs), CKD3, asthma, GERD, and anxiety/depression. Admitted 3/20-3/22/23 following syncopal episode likely from dehydration (hyponatremia Na 132, hypokalemia K 3.4) with mild worsening of renal function. Stopped HCTZ and started hydralazine for BP control after admission. At time of syncopal episode in March, pt was doing [place] diet (drinking 80 oz water/day, using diet protein packets, cooking one lean and green meal/day). After discharge Dr. Angelena Form increased metoprolol to 50 mg BID and referred to HTN clinic after patient sent a message that her BP was still elevated to 160/86 after this change. BP was 124/64 when seen by HTN clinic 06/12/21 but patient reported nausea and TID dosing with hydralazine was inhibiting QoL and requested change. Hydralazine was discontinued and valsartan initiated after clarifying that patient has only ever experienced dry cough (no swelling) with ACEi and patient had no memory of trying losartan in the past. Metoprolol was also switched to equivalent dose of carvedilol.   At visit on 6/13 BP was 136/90. Carvedilol was increased to '25mg'$  twice a day. Valsartan dose was not increased due to a increase in scr. She was eventually switched to lisinopril as patient stated it was the only thing that ever worked for her. She was willing to live with the cough. BMP was stable. At last visit amlodipine 2.'5mg'$  was resumed as patient fatigue did not improve off of amlodipine.   At last visit, blood pressure was 120/76. Home BP was mostly at goal as well. No changes were made to medications.  Patient presents today. Her biggest compliant is her cough from lisinopril. States she didn't take it for 2 days and her cough got better. She has had ACE induced  cough before, said she could live with it because it works, but now reports she wants to stop it.   Of note, patient wishes to retry HCTZ- in the past it was thought to have caused dehydration and hyponatremia. Patient convinced her syncopal episode was from drinking too much water. With SNRI she is at high risk for hyponatremia.  .  Home cuff previously validated 120/76 clinic 123/81 home  Still seeing her psychologist and psychiatrist.  Current HTN meds: lisinopril 10 mg daily, carvedilol 25 mg BID, amlodipine 2.'5mg'$  daily Previously tried: lisinopril (cough), losartan (cough), felodipine (increased flushing, malaise), HCTZ (syncope, worsening Scr that improved with discontinuation), metoprolol (switched to carvedilol for better BP lowering), hydralazine (nausea, abdominal pain), valsartan (ineffective) BP goal: <130/80 mmHg  Family History: HLD in mother/father/sister, CAD s/p CABG in father, HTN in sister  Social History: Former smoker (2.5 pack year history, quit 1989). Drinks alcohol occasionally in social settings. Denies drug use.   Diet: Tries not to add salt to foods. Drinks 1-2 cokes per day. Tries to avoid fried foods. Eats a lot of chicken, fish.   Exercise: Walking 1 mile sometimes, recently doesn't have the energy for it  Home BP readings: 120/84   Wt Readings from Last 3 Encounters:  04/30/21 150 lb (68 kg)  04/27/21 151 lb (68.5 kg)  04/18/21 148 lb 2.4 oz (67.2 kg)   BP Readings from  Last 3 Encounters:  11/29/21 120/76  10/24/21 116/80  09/20/21 138/82   Pulse Readings from Last 3 Encounters:  11/29/21 83  10/24/21 71  09/20/21 79   Renal function: CrCl cannot be calculated (Patient's most recent lab result is older than the maximum 21 days allowed.).  Past Medical History:  Diagnosis Date   Anxiety    Asthma    Chronic kidney disease    Chronic kidney disease, stage 3 ([place]) 08/10/2019   Depression    Hyperlipidemia    Hypertension    Migraine     Current Outpatient Medications on File Prior to Visit  Medication Sig Dispense Refill   ALPRAZolam (XANAX) 1 MG tablet TAKE 1 TABLET AT BEDTIME AND 1/2-1 TABLET AS NEEDED FOR ANXIETY. 45 tablet 4   amLODipine (NORVASC) 2.5 MG tablet Take 1 tablet (2.5 mg total) by mouth daily. 90 tablet 3   cariprazine (VRAYLAR) 1.5 MG capsule Take 1 capsule every 1-2 days 30 capsule 1   carvedilol (COREG) 25 MG tablet Take 1 tablet (25 mg total) by mouth 2 (two) times daily with a meal. 60 tablet 5   Cholecalciferol (VITAMIN D3) 50 MCG (2000 UT) capsule Take 1 tablet by mouth every morning.     Cyanocobalamin (VITAMIN B-12 PO) Take 1 tablet by mouth every morning.     DULoxetine (CYMBALTA) 30 MG capsule Take 1 capsule (30 mg total) by mouth daily. Take with a 60 mg capsule to equal total dose of 90 mg. 90 capsule 0   DULoxetine (CYMBALTA) 60 MG capsule Take 1 capsule (60 mg total) by mouth daily. Take with a 30 mg capsule to equal total dose of 90 mg. 90 capsule 0   esomeprazole (NEXIUM) 20 MG capsule Take 20 mg by mouth as needed.     lisinopril (ZESTRIL) 10 MG tablet Take 1 tablet (10 mg total) by mouth daily. 90 tablet 3   lithium carbonate 150 MG capsule Take 1 capsule po QHS x 3-5 days, then increase to 2 capsules po QHS 60 capsule 1   REPATHA SURECLICK 086 MG/ML SOAJ INJECT 1 PEN INTO THE SKIN EVERY 14 DAYS (Patient taking differently: Inject 140 mg into the skin every 14 (fourteen) days. Every other Monday) 2 mL 11   rizatriptan (MAXALT) 10 MG tablet Take 10 mg by mouth See admin instructions. Take one tablet (10 mg) by mouth at onset of migraine headache, may repeat in 2 hours if still needed.     zaleplon (SONATA) 10 MG capsule Take 1 capsule (10 mg total) by mouth at bedtime as needed for sleep. 30 capsule 5   No current facility-administered medications on file prior to visit.   Allergies  Allergen Reactions   Ace Inhibitors Cough   Ambien [Zolpidem] Other (See Comments)    Other reaction(s):  headache/neck throbbing   Amoxicillin Other (See Comments)    Gi upset   Codeine Nausea And Vomiting    Can tolerate hydrocodone if has food on stomach     Cortisone Other (See Comments)    Extremely high BP- reaction to injection   Felodipine Other (See Comments)     increased flushing and made her feel bad   Hydralazine     nausea   Hydrocodone-Acetaminophen Nausea And Vomiting    Must eat before and after each dose   Lisinopril Cough    Dry cough   Losartan Potassium Cough   Nexletol [Bempedoic Acid] Other (See Comments)    Brain fog  Venlafaxine Other (See Comments)    headaches/elevated blood pressure   Zetia [Ezetimibe] Diarrhea   Crestor [Rosuvastatin] Rash    facial rash   Lamictal [Lamotrigine] Rash   Statins Rash    Face and leg rash with rosuvastatin, pravastatin, and atorvastatin   Assessment/Plan:  1. Hypertension - BP of 120/70 is at goal of <130/80 however patient reports ACE induced cough. Stop lisinopril. Very hesitant to retry HCTZ because of her hyponatremia. Although spironolactone can cause hyponatremia, it is less prevalent. Will try spironolactone 12.'5mg'$  daily. Patient has several intolerances. BMP in 2 weeks. Continue amlodipine 2.'5mg'$  and carvedilol '25mg'$  twice a day. Follow up for BP check 02/20/22.   Thank you,  Ramond Dial, Pharm.D, BCPS, CPP [facility] [address] [place], [place] [address]  Phone: [phone]; Fax: [phone]

## 2022-01-11 DIAGNOSIS — R051 Acute cough: Secondary | ICD-10-CM | POA: Diagnosis not present

## 2022-01-11 DIAGNOSIS — J189 Pneumonia, unspecified organism: Secondary | ICD-10-CM | POA: Diagnosis not present

## 2022-01-14 ENCOUNTER — Ambulatory Visit: Payer: BC Managed Care – PPO | Admitting: Psychiatry

## 2022-01-14 ENCOUNTER — Encounter: Payer: Self-pay | Admitting: Psychiatry

## 2022-01-14 DIAGNOSIS — F5104 Psychophysiologic insomnia: Secondary | ICD-10-CM

## 2022-01-14 DIAGNOSIS — F419 Anxiety disorder, unspecified: Secondary | ICD-10-CM

## 2022-01-14 DIAGNOSIS — F32A Depression, unspecified: Secondary | ICD-10-CM | POA: Diagnosis not present

## 2022-01-14 MED ORDER — DULOXETINE HCL 30 MG PO CPEP: 30.0000 mg | ORAL_CAPSULE | Freq: Every day | ORAL | 0 refills | Status: DC

## 2022-01-14 MED ORDER — LITHIUM CARBONATE 150 MG PO CAPS: 300.0000 mg | ORAL_CAPSULE | Freq: Every day | ORAL | 0 refills | Status: DC

## 2022-01-14 MED ORDER — DULOXETINE HCL 60 MG PO CPEP: 60.0000 mg | ORAL_CAPSULE | Freq: Every day | ORAL | 0 refills | Status: DC

## 2022-01-14 NOTE — Progress Notes (Signed)
Amanda [name] [id number] 12-08-1960 61 y.o.  Subjective:   Patient ID:  Amanda [name] is a 61 y.o. (DOB 1960-12-08) female.  Chief Complaint:  Chief Complaint  Patient presents with   Follow-up    Depression, anxiety, and insomnia    HPI Amanda [name] presents to the office today for follow-up of depression, anxiety, and insomnia. She has had bilateral pneumonia.   She reports that Lithium seems to have been helpful for depression. She reports, "I'm not depressed." Denies sad mood. She reports that her energy and motivation have been low since she has been sick. She has been doing more chores around the house. . She reports that she is "more content at home." She was getting up and going places before she was sick. She reports increased enjoyment in things. She has had some irritability with prednisone. Anxiety has been ok and denies any recent rumination. She notices she is reframing worries and telling herself, "that's not my problem to deal with." Sleeping ok. Appetite has been increased with Prednisone. Concentration is "still not great." She was going into the office once a week before she got sick and was working more from home. Denies SI.   Daughter made [employer] indoor field hockey team. Daughter was sick 2 weeks ago.   About to start their busy season at work.  Takes Xanax prn during the day.    Past Psychiatric Medication Trials: Wellbutrin XL- No improvement at 300 mg dose. Trintellix Lexapro- "felt the best on." Reports that she had some hair loss Zoloft Celexa Viibryd Prozac Effexor Remeron Auvelity- Ineffective Ambien-Effective and then no longer effective. Sleep was not restful. Parasomnias. Lunesta-Ineffective Belsomra-Ineffective Trazodone-ineffective.  Doxepin-Ineffective Klonopin xanax Gabapentin Lamictal- Rash Abilify Rexulti- Akathisia. No benefit Latuda- Akathisia, clinching teeth, nausea, food cravings [place] Office Visit from 01/14/2022 in [place] Office Visit from 11/06/2021 in [place] Visit from 10/09/2021 in [place] Total Score 1 0 0      PHQ2-9    [place] Office Visit from 12/11/2021 in [facility]  PHQ-2 Total Score 5  PHQ-9 Total Score 14      Flowsheet Row ED from 04/30/2021 in [place] [facility] ED to Hosp-Admission (Discharged) from 04/16/2021 in [place] ED to Hosp-Admission (Discharged) from 03/21/2020 in [facility]  C-SSRS RISK CATEGORY No Risk No Risk No Risk        Review of Systems:  Review of Systems  Respiratory:  Positive for shortness of breath and wheezing.   Gastrointestinal: Negative.   Musculoskeletal:  Negative for gait problem.  Neurological:        Notices an occ mild tremor in her right index finger.  Psychiatric/Behavioral:         Please refer to HPI    Medications: I have reviewed the patient's current medications.  Current Outpatient Medications  Medication Sig Dispense Refill   ALPRAZolam (XANAX) 1 MG tablet TAKE 1 TABLET AT BEDTIME AND 1/2-1 TABLET AS NEEDED FOR ANXIETY. 45 tablet 4   amLODipine (NORVASC) 2.5 MG tablet Take 1 tablet (2.5 mg total) by mouth daily. 90 tablet 3   cariprazine (VRAYLAR) 1.5 MG capsule Take 1 capsule every 1-2 days 30 capsule 1   carvedilol (COREG) 25 MG tablet Take 1 tablet (25 mg total) by mouth 2 (two) times daily with a meal.  60 tablet 5   Cholecalciferol (VITAMIN D3) 50 MCG (2000 UT) capsule Take 1 tablet by mouth every morning.     Cyanocobalamin (VITAMIN B-12 PO) Take 1 tablet by mouth every morning.     doxycycline (VIBRA-TABS) 100 MG tablet Take 100 mg by mouth 2 (two) times daily.     esomeprazole (NEXIUM) 20 MG capsule Take 20 mg by mouth as needed.     predniSONE (DELTASONE) 20 MG tablet Take 20 mg by  mouth 2 (two) times daily.     REPATHA SURECLICK 314 MG/ML SOAJ INJECT 1 PEN INTO THE SKIN EVERY 14 DAYS (Patient taking differently: Inject 140 mg into the skin every 14 (fourteen) days. Every other Monday) 2 mL 11   rizatriptan (MAXALT) 10 MG tablet Take 10 mg by mouth See admin instructions. Take one tablet (10 mg) by mouth at onset of migraine headache, may repeat in 2 hours if still needed.     spironolactone (ALDACTONE) 25 MG tablet Take 0.5 tablets (12.5 mg total) by mouth daily. 45 tablet 3   zaleplon (SONATA) 10 MG capsule Take 1 capsule (10 mg total) by mouth at bedtime as needed for sleep. 30 capsule 5   DULoxetine (CYMBALTA) 30 MG capsule Take 1 capsule (30 mg total) by mouth daily. Take with a 60 mg capsule to equal total dose of 90 mg. 90 capsule 0   DULoxetine (CYMBALTA) 60 MG capsule Take 1 capsule (60 mg total) by mouth daily. Take with a 30 mg capsule to equal total dose of 90 mg. 90 capsule 0   lithium carbonate 150 MG capsule Take 2 capsules (300 mg total) by mouth at bedtime. 180 capsule 0   No current facility-administered medications for this visit.    Medication Side Effects: None  Allergies:  Allergies  Allergen Reactions   Ace Inhibitors Cough   Ambien [Zolpidem] Other (See Comments)    Other reaction(s): headache/neck throbbing   Amoxicillin Other (See Comments)    Gi upset   Codeine Nausea And Vomiting    Can tolerate hydrocodone if has food on stomach     Cortisone Other (See Comments)    Extremely high BP- reaction to injection   Felodipine Other (See Comments)     increased flushing and made her feel bad   Hydralazine     nausea   Hydrocodone-Acetaminophen Nausea And Vomiting    Must eat before and after each dose   Lisinopril Cough    Dry cough   Losartan Potassium Cough   Nexletol [Bempedoic Acid] Other (See Comments)    Brain fog   Venlafaxine Other (See Comments)    headaches/elevated blood pressure   Zetia [Ezetimibe] Diarrhea   Crestor  [Rosuvastatin] Rash    facial rash   Lamictal [Lamotrigine] Rash   Statins Rash    Face and leg rash with rosuvastatin, pravastatin, and atorvastatin    Past Medical History:  Diagnosis Date   Anxiety    Asthma    Chronic kidney disease    Chronic kidney disease, stage 3 ([place]) 08/10/2019   Depression    Hyperlipidemia    Hypertension    Migraine     Past Medical History, Surgical history, Social history, and Family history were reviewed and updated as appropriate.   Please see review of systems for further details on the patient's review from today.   Objective:   Physical Exam:  LMP 03/23/2011   Physical Exam Constitutional:      General: She is not  in acute distress. Musculoskeletal:        General: No deformity.  Neurological:     Mental Status: She is alert and oriented to person, place, and time.     Coordination: Coordination normal.  Psychiatric:        Attention and Perception: Attention and perception normal. She does not perceive auditory or visual hallucinations.        Mood and Affect: Mood normal. Mood is not anxious or depressed. Affect is not labile, blunt, angry or inappropriate.        Speech: Speech normal.        Behavior: Behavior normal.        Thought Content: Thought content normal. Thought content is not paranoid or delusional. Thought content does not include homicidal or suicidal ideation. Thought content does not include homicidal or suicidal plan.        Cognition and Memory: Cognition and memory normal.        Judgment: Judgment normal.     Comments: Insight intact     Lab Review:     Component Value Date/Time   NA 143 10/05/2021 1241   K 4.6 10/05/2021 1241   CL 105 10/05/2021 1241   CO2 23 10/05/2021 1241   GLUCOSE 103 (H) 10/05/2021 1241   GLUCOSE 119 (H) 04/30/2021 1234   BUN 11 10/05/2021 1241   CREATININE 1.01 (H) 10/05/2021 1241   CALCIUM 10.0 10/05/2021 1241   PROT 7.6 04/30/2021 1234   PROT 6.6 07/03/2020 1128    ALBUMIN 4.2 04/30/2021 1234   ALBUMIN 3.9 07/03/2020 1128   AST 22 04/30/2021 1234   ALT 26 04/30/2021 1234   ALKPHOS 64 04/30/2021 1234   BILITOT 0.4 04/30/2021 1234   BILITOT 0.5 07/03/2020 1128   GFRNONAA >60 04/30/2021 1234   GFRAA 38 (L) 11/03/2019 1107       Component Value Date/Time   WBC 11.3 (H) 04/30/2021 1234   RBC 4.58 04/30/2021 1234   HGB 13.9 04/30/2021 1234   HCT 41.2 04/30/2021 1234   PLT 385 04/30/2021 1234   MCV 90.0 04/30/2021 1234   MCH 30.3 04/30/2021 1234   MCHC 33.7 04/30/2021 1234   RDW 12.5 04/30/2021 1234   LYMPHSABS 3.3 04/18/2021 0415   MONOABS 0.7 04/18/2021 0415   EOSABS 0.1 04/18/2021 0415   BASOSABS 0.1 04/18/2021 0415    No results found for: "POCLITH", "LITHIUM"   No results found for: "PHENYTOIN", "PHENOBARB", "VALPROATE", "CBMZ"   .res Assessment: Plan:    Pt seen for 25 minutes and time spent discussing response to Lithium. She reports that prior to getting pneumonia her mood, energy, motivation, and interest in things had improved. She currently denies depressed mood. She was recently started on Spironolactone and has upcoming [name] to re-check labs.  Will continue current plan of care at this time since she reports improved mood, anxiety, and insomnia without significant adverse effects.  Continue Lithium 300 mg at bedtime for depression.  Continue Vraylar 1.5 mg every other day for depression.  Continue Duloxetine 90 mg daily for anxiety and depression.  Continue Sonata 10 mg po QHS prn insomnia.  Continue Alprazolam 1 mg at bedtime and 1/2-1 tab po qd prn anxiety. Discussed using lowest possible effective dose to minimize risk of tolerance and dependence.  Pt to follow-up in 2 months or sooner if clinically indicated.  Patient advised to contact office with any questions, adverse effects, or acute worsening in signs and symptoms.   Saara was seen today  for follow-up.  Diagnoses and all orders for this visit:  Depression,  unspecified depression type -     DULoxetine (CYMBALTA) 60 MG capsule; Take 1 capsule (60 mg total) by mouth daily. Take with a 30 mg capsule to equal total dose of 90 mg. -     DULoxetine (CYMBALTA) 30 MG capsule; Take 1 capsule (30 mg total) by mouth daily. Take with a 60 mg capsule to equal total dose of 90 mg. -     lithium carbonate 150 MG capsule; Take 2 capsules (300 mg total) by mouth at bedtime.  Anxiety -     DULoxetine (CYMBALTA) 60 MG capsule; Take 1 capsule (60 mg total) by mouth daily. Take with a 30 mg capsule to equal total dose of 90 mg. -     DULoxetine (CYMBALTA) 30 MG capsule; Take 1 capsule (30 mg total) by mouth daily. Take with a 60 mg capsule to equal total dose of 90 mg.  Chronic insomnia -     DULoxetine (CYMBALTA) 60 MG capsule; Take 1 capsule (60 mg total) by mouth daily. Take with a 30 mg capsule to equal total dose of 90 mg.     Please see After Visit Summary for patient specific instructions.  Future Appointments  Date Time Provider Pulc[place]  01/24/2022 11:15 AM [facility]  02/20/2022  2:30 PM [facility] PHARMACIST [facility]  03/18/2022  1:00 PM Thayer Headings, PMHNP [facility] None    No orders of the defined types were placed in this encounter.   -------------------------------

## 2022-01-18 DIAGNOSIS — R110 Nausea: Secondary | ICD-10-CM | POA: Diagnosis not present

## 2022-01-18 DIAGNOSIS — J189 Pneumonia, unspecified organism: Secondary | ICD-10-CM | POA: Diagnosis not present

## 2022-01-24 ENCOUNTER — Ambulatory Visit: Payer: BC Managed Care – PPO | Attending: Cardiovascular Disease

## 2022-01-24 DIAGNOSIS — I10 Essential (primary) hypertension: Secondary | ICD-10-CM | POA: Diagnosis not present

## 2022-01-25 ENCOUNTER — Telehealth: Payer: Self-pay | Admitting: Pharmacist

## 2022-01-25 LAB — BASIC METABOLIC PANEL
BUN/Creatinine Ratio: 13 (ref 12–28)
BUN: 17 mg/dL (ref 8–27)
CO2: 22 mmol/L (ref 20–29)
Calcium: 10 mg/dL (ref 8.7–10.3)
Chloride: 99 mmol/L (ref 96–106)
Creatinine, Ser: 1.36 mg/dL — ABNORMAL HIGH (ref 0.57–1.00)
Glucose: 98 mg/dL (ref 70–99)
Potassium: 4.3 mmol/L (ref 3.5–5.2)
Sodium: 137 mmol/L (ref 134–144)
eGFR: 44 mL/min/{1.73_m2} — ABNORMAL LOW (ref >59)

## 2022-01-25 MED ORDER — AMLODIPINE BESYLATE 5 MG PO TABS: 5.0000 mg | ORAL_TABLET | Freq: Every day | ORAL | 5 refills | Status: DC

## 2022-01-25 NOTE — Telephone Encounter (Signed)
BMET checked after starting spironolactone 12.'5mg'$  daily. SCr has increased ~35%. Called pt, she also reports new rash on her legs and arms that is red and itchy. Reports it started after starting spironolactone but isn't 100% sure it's from the medication.  Will stop spironolactone and increase her amlodipine from 2.'5mg'$  to '5mg'$  daily. Reports she hasn't tried a higher dose of this before. She has HTN f/u in ~3 weeks. Will repeat BMET at that time to ensure SCr has returned back to baseline. Pt appreciative for the call.

## 2022-02-18 ENCOUNTER — Telehealth: Payer: Self-pay | Admitting: Psychiatry

## 2022-02-18 NOTE — Telephone Encounter (Signed)
Please let her know that this side effect is not associated with any of her current medications. Reviewed medication database and it does not list this, even with rare adverse effects with Vraylar, Lithium, or Cymbalta.

## 2022-02-18 NOTE — Telephone Encounter (Signed)
PT called in around 10am today. Has a question as to whether any meds she is prescribed might cause menstrual cramps and spotting?

## 2022-02-18 NOTE — Telephone Encounter (Signed)
Pt informed

## 2022-02-18 NOTE — Telephone Encounter (Signed)
Please advise 

## 2022-02-20 ENCOUNTER — Ambulatory Visit: Payer: BC Managed Care – PPO | Attending: Cardiology | Admitting: Pharmacist

## 2022-02-20 VITALS — BP 114/70 | HR 68

## 2022-02-20 DIAGNOSIS — I10 Essential (primary) hypertension: Secondary | ICD-10-CM

## 2022-02-20 NOTE — Progress Notes (Unsigned)
Patient ID: Amanda [name]                 DOB: 22-Sep-1960                      MRN: [id number]      HPI: Amanda [name] is a 62 y.o. female referred by Dr. Angelena [name] to HTN clinic. PMH is significant for HTN, HLD, palpitations (PACs/PVCs), CKD3, asthma, GERD, and anxiety/depression. Admitted 3/20-3/22/23 following syncopal episode likely from dehydration (hyponatremia Na 132, hypokalemia K 3.4) with mild worsening of renal function. Stopped HCTZ and started hydralazine for BP control after admission. At time of syncopal episode in March, pt was doing [place] diet (drinking 80 oz water/day, using diet protein packets, cooking one lean and green meal/day). After discharge Dr. Angelena [name] increased metoprolol to 50 mg BID and referred to HTN clinic after patient sent a message that her BP was still elevated to 160/86 after this change. BP was 124/64 when seen by HTN clinic 06/12/21 but patient reported nausea and TID dosing with hydralazine was inhibiting QoL and requested change. Hydralazine was discontinued and valsartan initiated after clarifying that patient has only ever experienced dry cough (no swelling) with ACEi and patient had no memory of trying losartan in the past. Metoprolol was also switched to equivalent dose of carvedilol.   At visit on 6/13 BP was 136/90. Carvedilol was increased to '25mg'$  twice a day. Valsartan dose was not increased due to a increase in scr. She was eventually switched to lisinopril as patient stated it was the only thing that ever worked for her. She was willing to live with the cough. BMP was stable. At last visit amlodipine 2.'5mg'$  was resumed as patient fatigue did not improve off of amlodipine.   On 11/29/21  blood pressure was 120/76. Home BP was mostly at goal as well. No changes were made to medications. At last visit 01/09/22 blood pressure was well controlled but patient complained of ACE induced cough and wished to stop lisinopril. She was started on spironolactone but  her scr increased 35% and she developed a rash. This was stopped and her amlodipine was increased to '5mg'$  daily.   Patient presents today to clinic. Brings in 4 blood pressure readings that are fairly well controlled. Has not been walking. Has these red bumps (almost look like pimples) that come and go. Still doesn't have much energy. Having cramping. Advised to see PCP. No dizziness/swelling. .  Home cuff previously validated 120/76 clinic 123/81 home  Still seeing her psychologist and psychiatrist.  Current HTN meds: carvedilol 25 mg twice a day, amlodipine '5mg'$  daily Previously tried: lisinopril (cough), losartan (cough), felodipine (increased flushing, malaise), HCTZ (syncope, worsening Scr that improved with discontinuation), metoprolol (switched to carvedilol for better BP lowering), hydralazine (nausea, abdominal pain), valsartan (ineffective), spironolactone (increased scr and rash) BP goal: <130/80 mmHg  Family History: HLD in mother/father/sister, CAD s/p CABG in father, HTN in sister  Social History: Former smoker (2.5 pack year history, quit 1989). Drinks alcohol occasionally in social settings. Denies drug use.   Diet: Tries not to add salt to foods. Drinks 1-2 cokes per day. Tries to avoid fried foods. Eats a lot of chicken, fish.   Exercise: Walking 1 mile sometimes, recently doesn't have the energy for it  Home BP readings: 121/88, 114/75, 116/81, 107/80   Wt Readings from Last 3 Encounters:  04/30/21 150 lb (68 kg)  04/27/21 151 lb (68.5 kg)  04/18/21 148 lb  2.4 oz (67.2 kg)   BP Readings from Last 3 Encounters:  01/09/22 120/70  11/29/21 120/76  10/24/21 116/80   Pulse Readings from Last 3 Encounters:  11/29/21 83  10/24/21 71  09/20/21 79   Renal function: CrCl cannot be calculated (Patient's most recent lab result is older than the maximum 21 days allowed.).  Past Medical History:  Diagnosis Date   Anxiety    Asthma    Chronic kidney disease     Chronic kidney disease, stage 3 ([place]) 08/10/2019   Depression    Hyperlipidemia    Hypertension    Migraine    Current Outpatient Medications on File Prior to Visit  Medication Sig Dispense Refill   ALPRAZolam (XANAX) 1 MG tablet TAKE 1 TABLET AT BEDTIME AND 1/2-1 TABLET AS NEEDED FOR ANXIETY. 45 tablet 4   amLODipine (NORVASC) 5 MG tablet Take 1 tablet (5 mg total) by mouth daily. 30 tablet 5   cariprazine (VRAYLAR) 1.5 MG capsule Take 1 capsule every 1-2 days 30 capsule 1   carvedilol (COREG) 25 MG tablet Take 1 tablet (25 mg total) by mouth 2 (two) times daily with a meal. 60 tablet 5   Cholecalciferol (VITAMIN D3) 50 MCG (2000 UT) capsule Take 1 tablet by mouth every morning.     Cyanocobalamin (VITAMIN B-12 PO) Take 1 tablet by mouth every morning.     doxycycline (VIBRA-TABS) 100 MG tablet Take 100 mg by mouth 2 (two) times daily.     DULoxetine (CYMBALTA) 30 MG capsule Take 1 capsule (30 mg total) by mouth daily. Take with a 60 mg capsule to equal total dose of 90 mg. 90 capsule 0   DULoxetine (CYMBALTA) 60 MG capsule Take 1 capsule (60 mg total) by mouth daily. Take with a 30 mg capsule to equal total dose of 90 mg. 90 capsule 0   esomeprazole (NEXIUM) 20 MG capsule Take 20 mg by mouth as needed.     lithium carbonate 150 MG capsule Take 2 capsules (300 mg total) by mouth at bedtime. 180 capsule 0   predniSONE (DELTASONE) 20 MG tablet Take 20 mg by mouth 2 (two) times daily.     REPATHA SURECLICK 384 MG/ML SOAJ INJECT 1 PEN INTO THE SKIN EVERY 14 DAYS (Patient taking differently: Inject 140 mg into the skin every 14 (fourteen) days. Every other Monday) 2 mL 11   rizatriptan (MAXALT) 10 MG tablet Take 10 mg by mouth See admin instructions. Take one tablet (10 mg) by mouth at onset of migraine headache, may repeat in 2 hours if still needed.     zaleplon (SONATA) 10 MG capsule Take 1 capsule (10 mg total) by mouth at bedtime as needed for sleep. 30 capsule 5   No current  facility-administered medications on file prior to visit.   Allergies  Allergen Reactions   Ace Inhibitors Cough   Ambien [Zolpidem] Other (See Comments)    Other reaction(s): headache/neck throbbing   Amoxicillin Other (See Comments)    Gi upset   Codeine Nausea And Vomiting    Can tolerate hydrocodone if has food on stomach     Cortisone Other (See Comments)    Extremely high BP- reaction to injection   Felodipine Other (See Comments)     increased flushing and made her feel bad   Hydralazine     nausea   Hydrocodone-Acetaminophen Nausea And Vomiting    Must eat before and after each dose   Lisinopril Cough    Dry  cough   Losartan Potassium Cough   Nexletol [Bempedoic Acid] Other (See Comments)    Brain fog   Venlafaxine Other (See Comments)    headaches/elevated blood pressure   Zetia [Ezetimibe] Diarrhea   Crestor [Rosuvastatin] Rash    facial rash   Lamictal [Lamotrigine] Rash   Statins Rash    Face and leg rash with rosuvastatin, pravastatin, and atorvastatin   Assessment/Plan:  1. Hypertension - BP of 114/70 is at goal. Continue carvedilol 25 mg twice a day and amlodipine '5mg'$  daily. She should follow up with her PCP. Follow up as needed. She was taken to check out to make yearly apt with Dr. Angelena [name]. Will repeat BMP today.   Thank you,  Amanda [name], Pharm.D, BCPS, CPP  [facility] [address], [place], [place] [address]  Phone: [phone]; Fax: [phone]

## 2022-02-20 NOTE — Patient Instructions (Addendum)
Continue carvedilol 25 mg twice a day and amlodipine '5mg'$  daily  Call us with any questions or concerns  Try to get yourself to walk on a regular basis

## 2022-02-21 LAB — BASIC METABOLIC PANEL
BUN/Creatinine Ratio: 10 — ABNORMAL LOW (ref 12–28)
BUN: 11 mg/dL (ref 8–27)
CO2: 23 mmol/L (ref 20–29)
Calcium: 10.2 mg/dL (ref 8.7–10.3)
Chloride: 105 mmol/L (ref 96–106)
Creatinine, Ser: 1.05 mg/dL — ABNORMAL HIGH (ref 0.57–1.00)
Glucose: 94 mg/dL (ref 70–99)
Potassium: 4.4 mmol/L (ref 3.5–5.2)
Sodium: 142 mmol/L (ref 134–144)
eGFR: 60 mL/min/{1.73_m2} (ref >59)

## 2022-03-04 ENCOUNTER — Telehealth: Payer: Self-pay | Admitting: Cardiovascular Disease

## 2022-03-04 ENCOUNTER — Other Ambulatory Visit: Payer: Self-pay | Admitting: Obstetrics and Gynecology

## 2022-03-04 DIAGNOSIS — Z1231 Encounter for screening mammogram for malignant neoplasm of breast: Secondary | ICD-10-CM

## 2022-03-04 MED ORDER — REPATHA SURECLICK 140 MG/ML ~~LOC~~ SOAJ: 1.0000 | SUBCUTANEOUS | 11 refills | Status: DC

## 2022-03-04 NOTE — Telephone Encounter (Signed)
*  STAT* If patient is at the pharmacy, call can be transferred to refill team.   1. Which medications need to be refilled? (please list name of each medication and dose if known) REPATHA SURECLICK 338 MG/ML SOAJ   2. Which pharmacy/location (including street and city if local pharmacy) is medication to be sent to? [id number] - [place] AT [place]   3. Do they need a 30 day or 90 day supply? 30 day with refills.

## 2022-03-04 NOTE — Telephone Encounter (Signed)
Refill sent in

## 2022-03-18 ENCOUNTER — Ambulatory Visit: Payer: BC Managed Care – PPO | Admitting: Psychiatry

## 2022-03-19 ENCOUNTER — Encounter: Payer: Self-pay | Admitting: Psychiatry

## 2022-03-19 ENCOUNTER — Ambulatory Visit: Payer: BC Managed Care – PPO | Admitting: Psychiatry

## 2022-03-19 DIAGNOSIS — F5104 Psychophysiologic insomnia: Secondary | ICD-10-CM

## 2022-03-19 DIAGNOSIS — F419 Anxiety disorder, unspecified: Secondary | ICD-10-CM

## 2022-03-19 DIAGNOSIS — F32A Depression, unspecified: Secondary | ICD-10-CM | POA: Diagnosis not present

## 2022-03-19 MED ORDER — LITHIUM CARBONATE 150 MG PO CAPS: 300.0000 mg | ORAL_CAPSULE | Freq: Every day | ORAL | 0 refills | Status: DC

## 2022-03-19 MED ORDER — ALPRAZOLAM 1 MG PO TABS: ORAL_TABLET | ORAL | 4 refills | Status: DC

## 2022-03-19 MED ORDER — DULOXETINE HCL 60 MG PO CPEP: 60.0000 mg | ORAL_CAPSULE | Freq: Every day | ORAL | 0 refills | Status: DC

## 2022-03-19 MED ORDER — ZALEPLON 10 MG PO CAPS: 10.0000 mg | ORAL_CAPSULE | Freq: Every evening | ORAL | 5 refills | Status: DC | PRN

## 2022-03-19 NOTE — Progress Notes (Unsigned)
Amanda [name] VS:[id number] 1960-09-07 62 y.o.  Subjective:   Patient ID:  Amanda [name] is a 63 y.o. (DOB 09-Aug-1960) female.  Chief Complaint: No chief complaint on file.   Depression        Amanda [name] presents to the office today for follow-up of depression, anxiety, and insomnia. She reports that she weaned herself off Vraylar after having worsening depression at the end of the year and start of the year when learning cost would be $180. She reports that she also reduced Cymbalta by 30 mg and is now taking 60 mg daily.   "Mentally I feel better... I don't feel sad." She reports low energy and motivation. She enjoys movies and certain TV shows. She has been cooking some. Anxiety "is ok." She reports that her anxiety is usually in response to things going on with her daughter. Sleeping well. She reports that her concentration has improved since stopping Vraylar. She reports that she was able to focus on financial records for work yesterday.   "I'm more content staying at home." She reports that she rarely leaves home except to go to the the grocery store or daughter's activities. She has been working some from home and rarely going into the office. Wife will bring her work from the office to complete that are her projects. Estimates working about 8 hours a week total.   Wife is supportive. Daughter has recently not been wanting to go to school.   Reports that she had a sleep study about 3 years ago. She was told that she had "very mild" sleep apnea and did not warrant a cPap. She weighed 152 lbs at that time.  Alprazolam last filled 02/18/22 x 3. Sonata last filled 02/18/22 x 5  Past Psychiatric Medication Trials: Wellbutrin XL- No improvement at 300 mg dose. Trintellix Lexapro- "felt the best on." Reports that she had some hair loss Zoloft Celexa Viibryd Prozac Effexor Remeron Auvelity- Ineffective Ambien-Effective and then no longer effective. Sleep was not restful.  Parasomnias. Lunesta-Ineffective Belsomra-Ineffective Trazodone-ineffective.  Doxepin-Ineffective Klonopin xanax Gabapentin Lamictal- Rash Abilify Rexulti- Akathisia. No benefit Latuda- Akathisia, clinching teeth, nausea, food cravings [facility] Office Visit from 01/14/2022 in [facility] Office Visit from 11/06/2021 in [facility] Office Visit from 10/09/2021 in [facility] Total Score 1 0 0      PHQ2-9    [place] Office Visit from 12/11/2021 in [facility]  PHQ-2 Total Score 5  PHQ-9 Total Score 14      Flowsheet Row ED from 04/30/2021 in [facility] Emergency Department at [facility] ED to Hosp-Admission (Discharged) from 04/16/2021 in [facility] ED to Hosp-Admission (Discharged) from 03/21/2020 in [facility]  C-SSRS RISK CATEGORY No Risk No Risk No Risk        Review of Systems:  Review of Systems  Gastrointestinal: Negative.   Musculoskeletal:  Negative for gait problem.  Neurological:  Negative for tremors.  Psychiatric/Behavioral:  Positive for depression.        Please refer to HPI    Medications: I have reviewed the patient's current medications.  Current Outpatient Medications  Medication Sig Dispense Refill   ALPRAZolam (XANAX) 1 MG tablet TAKE 1 TABLET AT BEDTIME AND 1/2-1 TABLET AS NEEDED FOR ANXIETY. 45 tablet 4   amLODipine (NORVASC) 5 MG tablet  Take 1 tablet (5 mg total) by mouth daily. 30 tablet 5   cariprazine (VRAYLAR) 1.5 MG capsule Take 1 capsule every 1-2 days 30 capsule 1   carvedilol (COREG) 25 MG tablet Take 1 tablet (25 mg total) by mouth 2 (two) times daily with a meal. 60 tablet 5   Cholecalciferol (VITAMIN D3) 50 MCG (2000 UT) capsule Take 1 tablet by mouth every morning.     Cyanocobalamin (VITAMIN B-12 PO) Take  1 tablet by mouth every morning.     doxycycline (VIBRA-TABS) 100 MG tablet Take 100 mg by mouth 2 (two) times daily.     DULoxetine (CYMBALTA) 30 MG capsule Take 1 capsule (30 mg total) by mouth daily. Take with a 60 mg capsule to equal total dose of 90 mg. 90 capsule 0   DULoxetine (CYMBALTA) 60 MG capsule Take 1 capsule (60 mg total) by mouth daily. Take with a 30 mg capsule to equal total dose of 90 mg. 90 capsule 0   esomeprazole (NEXIUM) 20 MG capsule Take 20 mg by mouth as needed.     Evolocumab (REPATHA SURECLICK) [id number] MG/ML SOAJ Inject 140 mg into the skin every 14 (fourteen) days. 2 mL 11   lithium carbonate 150 MG capsule Take 2 capsules (300 mg total) by mouth at bedtime. 180 capsule 0   predniSONE (DELTASONE) 20 MG tablet Take 20 mg by mouth 2 (two) times daily.     rizatriptan (MAXALT) 10 MG tablet Take 10 mg by mouth See admin instructions. Take one tablet (10 mg) by mouth at onset of migraine headache, may repeat in 2 hours if still needed.     zaleplon (SONATA) 10 MG capsule Take 1 capsule (10 mg total) by mouth at bedtime as needed for sleep. 30 capsule 5   No current facility-administered medications for this visit.    Medication Side Effects: None  Allergies:  Allergies  Allergen Reactions   Ace Inhibitors Cough   Ambien [Zolpidem] Other (See Comments)    Other reaction(s): headache/neck throbbing   Amoxicillin Other (See Comments)    Gi upset   Codeine Nausea And Vomiting    Can tolerate hydrocodone if has food on stomach     Cortisone Other (See Comments)    Extremely high BP- reaction to injection   Felodipine Other (See Comments)     increased flushing and made her feel bad   Hydralazine     nausea   Hydrocodone-Acetaminophen Nausea And Vomiting    Must eat before and after each dose   Lisinopril Cough    Dry cough   Losartan Potassium Cough   Nexletol [Bempedoic Acid] Other (See Comments)    Brain fog   Venlafaxine Other (See Comments)     headaches/elevated blood pressure   Zetia [Ezetimibe] Diarrhea   Crestor [Rosuvastatin] Rash    facial rash   Lamictal [Lamotrigine] Rash   Spironolactone Rash    Increased scr and rash   Statins Rash    Face and leg rash with rosuvastatin, pravastatin, and atorvastatin    Past Medical History:  Diagnosis Date   Anxiety    Asthma    Chronic kidney disease    Chronic kidney disease, stage 3 ([place]) 08/10/2019   Depression    Hyperlipidemia    Hypertension    Migraine     Past Medical History, Surgical history, Social history, and Family history were reviewed and updated as appropriate.   Please see review of systems for further details on the  patient's review from today.   Objective:   Physical Exam:  LMP 03/23/2011   Physical Exam  Lab Review:     Component Value Date/Time   NA 142 02/20/2022 1449   K 4.4 02/20/2022 1449   CL 105 02/20/2022 1449   CO2 23 02/20/2022 1449   GLUCOSE 94 02/20/2022 1449   GLUCOSE 119 (H) 04/30/2021 1234   BUN 11 02/20/2022 1449   CREATININE 1.05 (H) 02/20/2022 1449   CALCIUM 10.2 02/20/2022 1449   PROT 7.6 04/30/2021 1234   PROT 6.6 07/03/2020 1128   ALBUMIN 4.2 04/30/2021 1234   ALBUMIN 3.9 07/03/2020 1128   AST 22 04/30/2021 1234   ALT 26 04/30/2021 1234   ALKPHOS 64 04/30/2021 1234   BILITOT 0.4 04/30/2021 1234   BILITOT 0.5 07/03/2020 1128   GFRNONAA >60 04/30/2021 1234   GFRAA 38 (L) 11/03/2019 1107       Component Value Date/Time   WBC 11.3 (H) 04/30/2021 1234   RBC 4.58 04/30/2021 1234   HGB 13.9 04/30/2021 1234   HCT 41.2 04/30/2021 1234   PLT 385 04/30/2021 1234   MCV 90.0 04/30/2021 1234   MCH 30.3 04/30/2021 1234   MCHC 33.7 04/30/2021 1234   RDW 12.5 04/30/2021 1234   LYMPHSABS 3.3 04/18/2021 0415   MONOABS 0.7 04/18/2021 0415   EOSABS 0.1 04/18/2021 0415   BASOSABS 0.1 04/18/2021 0415    No results found for: "POCLITH", "LITHIUM"   No results found for: "PHENYTOIN", "PHENOBARB", "VALPROATE", "CBMZ"    .res Assessment: Plan:    There are no diagnoses linked to this encounter.   Please see After Visit Summary for patient specific instructions.  Future Appointments  Date Time Provider [place]  03/19/2022 12:45 PM Thayer Headings, PMHNP CP-CP None  04/19/2022  2:20 PM [facility] MM 2 [facility]  09/10/2022  3:00 PM Burnell Blanks, MD [facility]    No orders of the defined types were placed in this encounter.   -------------------------------

## 2022-04-19 ENCOUNTER — Ambulatory Visit
Admission: RE | Admit: 2022-04-19 | Discharge: 2022-04-19 | Disposition: A | Discharge status: Home / Self Care | Payer: BC Managed Care – PPO | Source: Ambulatory Visit | Attending: Obstetrics and Gynecology | Admitting: Obstetrics and Gynecology

## 2022-04-19 DIAGNOSIS — Z1231 Encounter for screening mammogram for malignant neoplasm of breast: Secondary | ICD-10-CM | POA: Diagnosis not present

## 2022-05-16 ENCOUNTER — Telehealth: Payer: Self-pay | Admitting: Psychiatry

## 2022-05-16 DIAGNOSIS — F32A Depression, unspecified: Secondary | ICD-10-CM

## 2022-05-16 MED ORDER — LITHIUM CARBONATE 150 MG PO CAPS: 450.0000 mg | ORAL_CAPSULE | Freq: Every day | ORAL | 0 refills | Status: DC

## 2022-05-16 NOTE — Telephone Encounter (Signed)
Pt lvm @ 1:12p.  She said she is not doing well on the anti-depressant medicine.  She thinks it's Cymbalta.  She has no energy; just wants to stay in the bed and feels depressed.  Is there anything else she can do?  Next appt 5/20

## 2022-05-16 NOTE — Telephone Encounter (Signed)
Recommend increasing Lithium to 450 mg po QHS to improve mood since Lithium seems to have helped the most with depression in the past. Script has been sent to pharmacy. She may also want to move up apt. Please advise her to call back if depression worsens or does not improve.

## 2022-05-16 NOTE — Telephone Encounter (Signed)
Patient reports being depressed at 8/10. Has no energy, wants to stay in bed all the time. Sleeps well but naps frequently during the day. Periodic crying episodes. No SI. Cymbalta was reduced but she said she felt bad before the reduction, which was her reason to reduce it. Your note mentions increasing lithium dose.   Last note mentions improvement in mood with lithium, but low motivation.

## 2022-05-17 NOTE — Telephone Encounter (Signed)
Patient notified of Rx and recommendations. She declined to schedule an earlier appt.

## 2022-05-20 DIAGNOSIS — E782 Mixed hyperlipidemia: Secondary | ICD-10-CM | POA: Diagnosis not present

## 2022-05-20 DIAGNOSIS — Z0000 Encounter for general adult medical examination without abnormal findings: Secondary | ICD-10-CM | POA: Diagnosis not present

## 2022-05-20 DIAGNOSIS — N1832 Chronic kidney disease, stage 3b: Secondary | ICD-10-CM | POA: Diagnosis not present

## 2022-05-20 DIAGNOSIS — E538 Deficiency of other specified B group vitamins: Secondary | ICD-10-CM | POA: Diagnosis not present

## 2022-05-20 DIAGNOSIS — G43909 Migraine, unspecified, not intractable, without status migrainosus: Secondary | ICD-10-CM | POA: Diagnosis not present

## 2022-05-20 DIAGNOSIS — I129 Hypertensive chronic kidney disease with stage 1 through stage 4 chronic kidney disease, or unspecified chronic kidney disease: Secondary | ICD-10-CM | POA: Diagnosis not present

## 2022-05-20 DIAGNOSIS — E559 Vitamin D deficiency, unspecified: Secondary | ICD-10-CM | POA: Diagnosis not present

## 2022-06-17 ENCOUNTER — Ambulatory Visit: Payer: BC Managed Care – PPO | Admitting: Psychiatry

## 2022-06-17 ENCOUNTER — Encounter: Payer: Self-pay | Admitting: Psychiatry

## 2022-06-17 DIAGNOSIS — F5104 Psychophysiologic insomnia: Secondary | ICD-10-CM | POA: Diagnosis not present

## 2022-06-17 DIAGNOSIS — F419 Anxiety disorder, unspecified: Secondary | ICD-10-CM

## 2022-06-17 DIAGNOSIS — F32A Depression, unspecified: Secondary | ICD-10-CM

## 2022-06-17 MED ORDER — DULOXETINE HCL 30 MG PO CPEP: 30.0000 mg | ORAL_CAPSULE | Freq: Every day | ORAL | 0 refills | Status: DC

## 2022-06-17 MED ORDER — DULOXETINE HCL 60 MG PO CPEP: 60.0000 mg | ORAL_CAPSULE | Freq: Every day | ORAL | 0 refills | Status: DC

## 2022-06-17 MED ORDER — BUSPIRONE HCL 15 MG PO TABS: ORAL_TABLET | ORAL | 1 refills | Status: DC

## 2022-06-17 NOTE — Progress Notes (Unsigned)
Amanda [name] [id number] November 06, 1960 62 y.o.  Subjective:   Patient ID:  Amanda [name] is a 62 y.o. (DOB 1960/09/30) female.  Chief Complaint: No chief complaint on file.   HPI Amanda [name] presents to the office today for follow-up of *** She reports that increase in Lithium "seemed to help with the sadness." She reports that she is having difficulty getting out of bed and has not been going into the office. She reports that whenever she leaves home "it requires coaxing." She reports that she had planned to go to church and a church picnic, and ended up missing church and going to [employer] after really pushing herself. She reports that she has been falling behind on chores and tasks at home. She reports that her depressed mood is a 5/10 today. She reports that Mondays are typically better days for her and mood worsens as the week progresses. She has lost interest in things and has not wanted to day trade. Enjoys time with wife and child. She reports adequate sleep. Goes to bed around 11pm -midnight. May wake up at 10 am and not get out of bed until around 11 am. She reports that she has lost a few pounds since her appetite has been slightly less. She reports that she has been less physically active overall. She reports that her concentration has been ok.   Denies anhedonia.   Some anxiety about daughter going to college. She reports some worry and catastrophic thinking. Denies panic. "There are so many thoughts running through my head, It's almost like I am frozen." She reports "the mornings are the worst."   Working on some things from home. She reports that she tends to procrastinate. She reports that at times she is putting off depositing checks into their personal account.   Daughter is playing [employer] through [place].   She reports that before increase in Lithium her depression was 8/10 and she was having uncontrolled crying episodes. Denies recent crying episodes.    Past Psychiatric Medication Trials: Wellbutrin XL- No improvement at 300 mg dose. Trintellix Lexapro- "felt the best on." Reports that she had some hair loss Zoloft Celexa Viibryd Prozac Effexor Cymbalta Remeron Auvelity- Ineffective Ambien-Effective and then no longer effective. Sleep was not restful. Parasomnias. Lunesta-Ineffective Belsomra-Ineffective Trazodone-ineffective.  Doxepin-Ineffective Klonopin xanax Gabapentin Lamictal- Rash Lithium Abilify Rexulti- Akathisia. No benefit Latuda- Akathisia, clinching teeth, nausea, food cravings Vraylar [place]    AIMS    Flowsheet Row Office Visit from 01/14/2022 in [facility] Office Visit from 11/06/2021 in [facility] Office Visit from 10/09/2021 in [facility]  AIMS Total Score 1 0 0      PHQ2-9    Flowsheet Row Office Visit from 12/11/2021 in [facility]  PHQ-2 Total Score 5  PHQ-9 Total Score 14      Flowsheet Row ED from 04/30/2021 in [facility] Emergency Department at [facility] ED to Hosp-Admission (Discharged) from 04/16/2021 in [facility] ED to Hosp-Admission (Discharged) from 03/21/2020 in [facility]  C-SSRS RISK CATEGORY No Risk No Risk No Risk        Review of Systems:  Review of Systems  Medications: {medication reviewed/display:3041432}  Current Outpatient Medications  Medication Sig Dispense Refill   ALPRAZolam (XANAX) 1 MG tablet TAKE 1 TABLET AT BEDTIME AND 1/2-1 TABLET AS NEEDED FOR ANXIETY. 45 tablet  4   amLODipine (NORVASC) 5 MG tablet Take 1 tablet (5 mg total) by mouth daily. 30 tablet 5   carvedilol (COREG) 25 MG tablet Take 1 tablet (25 mg total) by mouth 2 (two) times daily with a meal. 60 tablet 5   Cholecalciferol (VITAMIN D3) 50 MCG (2000 UT) capsule Take 1 tablet by mouth every morning.      Cyanocobalamin (VITAMIN B-12 PO) Take 1 tablet by mouth every morning.     doxycycline (VIBRA-TABS) 100 MG tablet Take 100 mg by mouth 2 (two) times daily. (Patient not taking: Reported on 03/19/2022)     DULoxetine (CYMBALTA) 60 MG capsule Take 1 capsule (60 mg total) by mouth daily. 90 capsule 0   esomeprazole (NEXIUM) 20 MG capsule Take 20 mg by mouth as needed.     Evolocumab (REPATHA SURECLICK) 140 MG/ML SOAJ Inject 140 mg into the skin every 14 (fourteen) days. 2 mL 11   lithium carbonate 150 MG capsule Take 3 capsules (450 mg total) by mouth at bedtime. 270 capsule 0   rizatriptan (MAXALT) 10 MG tablet Take 10 mg by mouth See admin instructions. Take one tablet (10 mg) by mouth at onset of migraine headache, may repeat in 2 hours if still needed.     zaleplon (SONATA) 10 MG capsule Take 1 capsule (10 mg total) by mouth at bedtime as needed for sleep. 30 capsule 5   No current facility-administered medications for this visit.    Medication Side Effects: {Medication Side Effects (Optional):21014029}  Allergies:  Allergies  Allergen Reactions   Ace Inhibitors Cough   Ambien [Zolpidem] Other (See Comments)    Other reaction(s): headache/neck throbbing   Amoxicillin Other (See Comments)    Gi upset   Codeine Nausea And Vomiting    Can tolerate hydrocodone if has food on stomach     Cortisone Other (See Comments)    Extremely high BP- reaction to injection   Felodipine Other (See Comments)     increased flushing and made her feel bad   Hydralazine     nausea   Hydrocodone-Acetaminophen Nausea And Vomiting    Must eat before and after each dose   Lisinopril Cough    Dry cough   Losartan Potassium Cough   Nexletol [Bempedoic Acid] Other (See Comments)    Brain fog   Venlafaxine Other (See Comments)    headaches/elevated blood pressure   Zetia [Ezetimibe] Diarrhea   Crestor [Rosuvastatin] Rash    facial rash   Lamictal [Lamotrigine] Rash   Spironolactone Rash    Increased  scr and rash   Statins Rash    Face and leg rash with rosuvastatin, pravastatin, and atorvastatin    Past Medical History:  Diagnosis Date   Anxiety    Asthma    Chronic kidney disease    Chronic kidney disease, stage 3 ([facility]) 08/10/2019   Depression    Hyperlipidemia    Hypertension    Migraine     Past Medical History, Surgical history, Social history, and Family history were reviewed and updated as appropriate.   Please see review of systems for further details on the patient's review from today.   Objective:   Physical Exam:  LMP 03/23/2011   Physical Exam  Lab Review:     Component Value Date/Time   NA 142 02/20/2022 1449   K 4.4 02/20/2022 1449   CL 105 02/20/2022 1449   CO2 23 02/20/2022 1449   GLUCOSE 94 02/20/2022 1449   GLUCOSE 119 (  H) 04/30/2021 1234   BUN 11 02/20/2022 1449   CREATININE 1.05 (H) 02/20/2022 1449   CALCIUM 10.2 02/20/2022 1449   PROT 7.6 04/30/2021 1234   PROT 6.6 07/03/2020 1128   ALBUMIN 4.2 04/30/2021 1234   ALBUMIN 3.9 07/03/2020 1128   AST 22 04/30/2021 1234   ALT 26 04/30/2021 1234   ALKPHOS 64 04/30/2021 1234   BILITOT 0.4 04/30/2021 1234   BILITOT 0.5 07/03/2020 1128   GFRNONAA >60 04/30/2021 1234   GFRAA 38 (L) 11/03/2019 1107       Component Value Date/Time   WBC 11.3 (H) 04/30/2021 1234   RBC 4.58 04/30/2021 1234   HGB 13.9 04/30/2021 1234   HCT 41.2 04/30/2021 1234   PLT 385 04/30/2021 1234   MCV 90.0 04/30/2021 1234   MCH 30.3 04/30/2021 1234   MCHC 33.7 04/30/2021 1234   RDW 12.5 04/30/2021 1234   LYMPHSABS 3.3 04/18/2021 0415   MONOABS 0.7 04/18/2021 0415   EOSABS 0.1 04/18/2021 0415   BASOSABS 0.1 04/18/2021 0415    No results found for: "POCLITH", "LITHIUM"   No results found for: "PHENYTOIN", "PHENOBARB", "VALPROATE", "CBMZ"   .res Assessment: Plan:    There are no diagnoses linked to this encounter.   Please see After Visit Summary for patient specific instructions.  Future Appointments  Date  Time Provider Department Center  09/10/2022  3:00 PM Kathleene Hazel, MD [facility]    No orders of the defined types were placed in this encounter.   -------------------------------

## 2022-06-30 ENCOUNTER — Other Ambulatory Visit: Payer: Self-pay | Admitting: Cardiovascular Disease

## 2022-07-20 DIAGNOSIS — J189 Pneumonia, unspecified organism: Secondary | ICD-10-CM | POA: Diagnosis not present

## 2022-07-20 DIAGNOSIS — R051 Acute cough: Secondary | ICD-10-CM | POA: Diagnosis not present

## 2022-07-22 ENCOUNTER — Other Ambulatory Visit (HOSPITAL_COMMUNITY): Payer: Self-pay

## 2022-07-22 ENCOUNTER — Telehealth: Payer: Self-pay | Admitting: Cardiovascular Disease

## 2022-07-22 ENCOUNTER — Telehealth: Payer: Self-pay

## 2022-07-22 NOTE — Telephone Encounter (Signed)
Pharmacy Patient Advocate Encounter   Received notification from [facility] that prior authorization for REPATHA 140 MG/ML INJ is needed.    PA submitted on 07/22/22 Key [id number] Status is pending  Haze Rushing, CPhT Pharmacy Patient Advocate Specialist Direct Number: [phone] Fax: [phone]

## 2022-07-22 NOTE — Telephone Encounter (Signed)
Pt c/o medication issue:  1. Name of Medication:   Evolocumab (REPATHA SURECLICK) 140 MG/ML SOAJ    2. How are you currently taking this medication (dosage and times per day)? Inject 140 mg into the skin every 14 (fourteen) days.   3. Are you having a reaction (difficulty breathing--STAT)? No  4. What is your medication issue? Pharmacy calling to follow up on prior auth for above medication. Please advise

## 2022-07-22 NOTE — Telephone Encounter (Signed)
PA initiated, please see separate encounter for updates on determination. (I will route you back in once a decision has been made)  Dorthula Bier, CPhT Pharmacy Patient Advocate Specialist Direct Number: [phone] Fax: [phone]  

## 2022-07-24 NOTE — Telephone Encounter (Signed)
Pharmacy Patient Advocate Encounter  Prior Authorization for REPATHA has been approved.    Effective dates: 07/22/22 through 07/22/23  Keymoni Mccaster, CPhT Pharmacy Patient Advocate Specialist Direct Number: [phone] Fax: [phone] 

## 2022-07-29 ENCOUNTER — Ambulatory Visit: Payer: BC Managed Care – PPO | Admitting: Psychiatry

## 2022-07-30 ENCOUNTER — Other Ambulatory Visit: Payer: Self-pay | Admitting: Cardiovascular Disease

## 2022-08-07 ENCOUNTER — Ambulatory Visit: Payer: BC Managed Care – PPO | Admitting: Psychiatry

## 2022-08-07 ENCOUNTER — Ambulatory Visit: Payer: BC Managed Care – PPO | Attending: Cardiovascular Disease | Admitting: Cardiovascular Disease

## 2022-08-07 ENCOUNTER — Encounter: Payer: Self-pay | Admitting: Psychiatry

## 2022-08-07 ENCOUNTER — Encounter: Payer: Self-pay | Admitting: Cardiovascular Disease

## 2022-08-07 VITALS — BP 122/70 | HR 76 | Ht 60.0 in | Wt 159.0 lb

## 2022-08-07 DIAGNOSIS — F5104 Psychophysiologic insomnia: Secondary | ICD-10-CM

## 2022-08-07 DIAGNOSIS — F419 Anxiety disorder, unspecified: Secondary | ICD-10-CM

## 2022-08-07 DIAGNOSIS — E782 Mixed hyperlipidemia: Secondary | ICD-10-CM | POA: Diagnosis not present

## 2022-08-07 DIAGNOSIS — I493 Ventricular premature depolarization: Secondary | ICD-10-CM | POA: Diagnosis not present

## 2022-08-07 DIAGNOSIS — F32A Depression, unspecified: Secondary | ICD-10-CM | POA: Diagnosis not present

## 2022-08-07 DIAGNOSIS — I10 Essential (primary) hypertension: Secondary | ICD-10-CM

## 2022-08-07 MED ORDER — ALPRAZOLAM 1 MG PO TABS
ORAL_TABLET | ORAL | 4 refills | Status: DC
Start: 2022-09-04 — End: 2023-02-04

## 2022-08-07 MED ORDER — DULOXETINE HCL 60 MG PO CPEP: 60.0000 mg | ORAL_CAPSULE | Freq: Every day | ORAL | 0 refills | Status: DC

## 2022-08-07 MED ORDER — DULOXETINE HCL 30 MG PO CPEP: 30.0000 mg | ORAL_CAPSULE | Freq: Every day | ORAL | 0 refills | Status: DC

## 2022-08-07 MED ORDER — ZALEPLON 10 MG PO CAPS
10.0000 mg | ORAL_CAPSULE | Freq: Every evening | ORAL | 5 refills | Status: DC | PRN
Start: 2022-09-16 — End: 2023-02-04

## 2022-08-07 MED ORDER — BUSPIRONE HCL 15 MG PO TABS
15.0000 mg | ORAL_TABLET | Freq: Two times a day (BID) | ORAL | 0 refills | Status: DC
Start: 2022-08-07 — End: 2022-10-08

## 2022-08-07 NOTE — Progress Notes (Signed)
Amanda [name] [id number] 1960/06/10 62 y.o.  Subjective:   Patient ID:  Amanda [name] is a 62 y.o. (DOB September 11, 1960) female.  Chief Complaint:  Chief Complaint  Patient presents with   Follow-up    Depression and anxiety    HPI Amanda [name] presents to the office today for follow-up of depression and anxiety.   She reports that she decreased Lithium from 3 capsules to 2 capsules at bedtime for one week, and then down to 1 capsule at bedtime for the last week after noticing hair loss. She reports that she may notice some slight improvement in hair loss since reducing Lithium. She reports, "I can't tell I have reduced it... maybe a little more clear-headed." She denies any worsening depression since decreasing Lithium dose. She reports that she has been spending time mostly at home and is not interested in doing things outside the home unless it involves being with family. She has gone out to eat more with family and has been going to her daughter's games. She is not going into the office. She decided to significantly reduce work hours. Denies sad mood. She has been doing some day trading and "this helps occupy my mood." She reports that she has cooked a few times. She reports that her motivation is low. She reports that physical stamina is less due to being less active. Low energy and reports feeling tired. She reports anxiety has been "a little worse. I get fixated on something," typically involving her daughter. She reports some increased stressors with daughter in the last 2 weeks "and before that (Buspar) was helping." Some rumination about her daughter. She reports that she is worrying less about things in general. Sleeping well. Appetite has been good. She reports improved concentration with decrease in Lithium. Denies SI.   Past Psychiatric Medication Trials: Wellbutrin XL- No improvement at 300 mg dose. Trintellix Lexapro- "felt the best on." Reports that she had some hair  loss Zoloft Celexa Viibryd Prozac Effexor Cymbalta Remeron Auvelity- Ineffective Ambien-Effective and then no longer effective. Sleep was not restful. Parasomnias. Lunesta-Ineffective Belsomra-Ineffective Trazodone-ineffective.  Doxepin-Ineffective Klonopin xanax Gabapentin Lamictal- Rash Lithium Abilify Rexulti- Akathisia. No benefit Latuda- Akathisia, clinching teeth, nausea, food cravings Vraylar Deplin  AIMS    Flowsheet Row Office Visit from 01/14/2022 in [facility] Office Visit from 11/06/2021 in [facility] Office Visit from 10/09/2021 in [facility]  AIMS Total Score 1 0 0      PHQ2-9    Flowsheet Row Office Visit from 12/11/2021 in [facility]  PHQ-2 Total Score 5  PHQ-9 Total Score 14      Flowsheet Row ED from 04/30/2021 in [facility] Emergency Department at [facility] ED to Hosp-Admission (Discharged) from 04/16/2021 in [place] [facility] ED to Hosp-Admission (Discharged) from 03/21/2020 in [facility]  C-SSRS RISK CATEGORY No Risk No Risk No Risk        Review of Systems:  Review of Systems  Respiratory:  Negative for shortness of breath.        Recent pneumonia  Cardiovascular:        She has apt with cardiologist later today.  Musculoskeletal:  Positive for arthralgias. Negative for gait problem.  Neurological:  Negative for tremors.  Psychiatric/Behavioral:         Please refer to HPI    Medications: I have reviewed the patient's  current medications.  Current Outpatient Medications  Medication Sig Dispense Refill   amLODipine (NORVASC) 5 MG tablet TAKE 1 TABLET(5 MG) BY MOUTH DAILY 30 tablet 0   carvedilol (COREG) 25 MG tablet TAKE 1 TABLET(25 MG) BY MOUTH TWICE DAILY WITH A MEAL 180 tablet 0   Cholecalciferol (VITAMIN D3) 50 MCG (2000 UT) capsule Take 1 tablet  by mouth every morning.     Cyanocobalamin (VITAMIN B-12 PO) Take 1 tablet by mouth every morning.     esomeprazole (NEXIUM) 20 MG capsule Take 20 mg by mouth as needed.     Evolocumab (REPATHA SURECLICK) 140 MG/ML SOAJ Inject 140 mg into the skin every 14 (fourteen) days. 2 mL 11   ibuprofen (ADVIL) 200 MG tablet Take 200 mg by mouth every 6 (six) hours as needed.     [START ON 09/04/2022] ALPRAZolam (XANAX) 1 MG tablet TAKE 1 TABLET AT BEDTIME AND 1/2-1 TABLET AS NEEDED FOR ANXIETY. 45 tablet 4   busPIRone (BUSPAR) 15 MG tablet Take 1 tablet (15 mg total) by mouth 2 (two) times daily. 180 tablet 0   DULoxetine (CYMBALTA) 30 MG capsule Take 1 capsule (30 mg total) by mouth daily. Take with a 60 mg capsule to equal total dose of 90 mg. 90 capsule 0   DULoxetine (CYMBALTA) 60 MG capsule Take 1 capsule (60 mg total) by mouth daily. 90 capsule 0   rizatriptan (MAXALT) 10 MG tablet Take 10 mg by mouth See admin instructions. Take one tablet (10 mg) by mouth at onset of migraine headache, may repeat in 2 hours if still needed.     [START ON 09/16/2022] zaleplon (SONATA) 10 MG capsule Take 1 capsule (10 mg total) by mouth at bedtime as needed for sleep. 30 capsule 5   No current facility-administered medications for this visit.    Medication Side Effects: Other: Hair loss  Allergies:  Allergies  Allergen Reactions   Ace Inhibitors Cough   Ambien [Zolpidem] Other (See Comments)    Other reaction(s): headache/neck throbbing   Amoxicillin Other (See Comments)    Gi upset   Codeine Nausea And Vomiting    Can tolerate hydrocodone if has food on stomach     Cortisone Other (See Comments)    Extremely high BP- reaction to injection   Felodipine Other (See Comments)     increased flushing and made her feel bad   Hydralazine     nausea   Hydrocodone-Acetaminophen Nausea And Vomiting    Must eat before and after each dose   Lisinopril Cough    Dry cough   Losartan Potassium Cough   Nexletol  [Bempedoic Acid] Other (See Comments)    Brain fog   Venlafaxine Other (See Comments)    headaches/elevated blood pressure   Zetia [Ezetimibe] Diarrhea   Crestor [Rosuvastatin] Rash    facial rash   Lamictal [Lamotrigine] Rash   Spironolactone Rash    Increased scr and rash   Statins Rash    Face and leg rash with rosuvastatin, pravastatin, and atorvastatin    Past Medical History:  Diagnosis Date   Anxiety    Asthma    Chronic kidney disease    Chronic kidney disease, stage 3 (HCC) 08/10/2019   Depression    Hyperlipidemia    Hypertension    Migraine     Past Medical History, Surgical history, Social history, and Family history were reviewed and updated as appropriate.   Please see review of systems for further details on the patient's  review from today.   Objective:   Physical Exam:  LMP 03/23/2011   Physical Exam Constitutional:      General: She is not in acute distress. Musculoskeletal:        General: No deformity.  Neurological:     Mental Status: She is alert and oriented to person, place, and time.     Coordination: Coordination normal.  Psychiatric:        Attention and Perception: Attention and perception normal. She does not perceive auditory or visual hallucinations.        Mood and Affect: Affect is blunt. Affect is not labile, angry or inappropriate.        Speech: Speech normal.        Behavior: Behavior normal.        Thought Content: Thought content normal. Thought content is not paranoid or delusional. Thought content does not include homicidal or suicidal ideation. Thought content does not include homicidal or suicidal plan.        Cognition and Memory: Cognition and memory normal.        Judgment: Judgment normal.     Comments: Insight intact Dysthymic mood Anxiety and worry about daughter     Lab Review:     Component Value Date/Time   NA 142 02/20/2022 1449   K 4.4 02/20/2022 1449   CL 105 02/20/2022 1449   CO2 23 02/20/2022 1449    GLUCOSE 94 02/20/2022 1449   GLUCOSE 119 (H) 04/30/2021 1234   BUN 11 02/20/2022 1449   CREATININE 1.05 (H) 02/20/2022 1449   CALCIUM 10.2 02/20/2022 1449   PROT 7.6 04/30/2021 1234   PROT 6.6 07/03/2020 1128   ALBUMIN 4.2 04/30/2021 1234   ALBUMIN 3.9 07/03/2020 1128   AST 22 04/30/2021 1234   ALT 26 04/30/2021 1234   ALKPHOS 64 04/30/2021 1234   BILITOT 0.4 04/30/2021 1234   BILITOT 0.5 07/03/2020 1128   GFRNONAA >60 04/30/2021 1234   GFRAA 38 (L) 11/03/2019 1107       Component Value Date/Time   WBC 11.3 (H) 04/30/2021 1234   RBC 4.58 04/30/2021 1234   HGB 13.9 04/30/2021 1234   HCT 41.2 04/30/2021 1234   PLT 385 04/30/2021 1234   MCV 90.0 04/30/2021 1234   MCH 30.3 04/30/2021 1234   MCHC 33.7 04/30/2021 1234   RDW 12.5 04/30/2021 1234   LYMPHSABS 3.3 04/18/2021 0415   MONOABS 0.7 04/18/2021 0415   EOSABS 0.1 04/18/2021 0415   BASOSABS 0.1 04/18/2021 0415    No results found for: "POCLITH", "LITHIUM"   No results found for: "PHENYTOIN", "PHENOBARB", "VALPROATE", "CBMZ"   .res Assessment: Plan:    She reports that she would like to discontinue Lithium since she has noticed recent hair loss that seems to be improving with reducing dosage. She also denies any worsening mood symptoms with decreasing Lithium dose. Discussed that she could discontinue Lithium 150 mg po at bedtime due to side effects. Advised pt to contact office if she experiences worsening depression.  Will continue Buspar 15 mg po BID for anxiety since she reports that this has been helpful for generalized anxiety and worry, and was helpful for rumination about daughter until increased stressors 2 weeks ago. Discussed that Buspar is approved up to 30 mg twice daily and dose could be increased in the future if needed.  Will continue Duloxetine 90 mg daily for depression and anxiety.  Continue Alprazolam 1 mg at bedtime and 1/2-1 tablet as needed for anxiety.  Continue Sonata 10 mg po at bedtime prn  insomnia.  Pt to follow-up in 2 months or sooner if clinically indicated.  Patient advised to contact office with any questions, adverse effects, or acute worsening in signs and symptoms.    Azayla was seen today for follow-up.  Diagnoses and all orders for this visit:  Anxiety -     ALPRAZolam (XANAX) 1 MG tablet; TAKE 1 TABLET AT BEDTIME AND 1/2-1 TABLET AS NEEDED FOR ANXIETY. -     busPIRone (BUSPAR) 15 MG tablet; Take 1 tablet (15 mg total) by mouth 2 (two) times daily. -     DULoxetine (CYMBALTA) 60 MG capsule; Take 1 capsule (60 mg total) by mouth daily. -     DULoxetine (CYMBALTA) 30 MG capsule; Take 1 capsule (30 mg total) by mouth daily. Take with a 60 mg capsule to equal total dose of 90 mg.  Chronic insomnia -     ALPRAZolam (XANAX) 1 MG tablet; TAKE 1 TABLET AT BEDTIME AND 1/2-1 TABLET AS NEEDED FOR ANXIETY. -     DULoxetine (CYMBALTA) 60 MG capsule; Take 1 capsule (60 mg total) by mouth daily. -     zaleplon (SONATA) 10 MG capsule; Take 1 capsule (10 mg total) by mouth at bedtime as needed for sleep.  Depression, unspecified depression type -     DULoxetine (CYMBALTA) 60 MG capsule; Take 1 capsule (60 mg total) by mouth daily. -     DULoxetine (CYMBALTA) 30 MG capsule; Take 1 capsule (30 mg total) by mouth daily. Take with a 60 mg capsule to equal total dose of 90 mg.     Please see After Visit Summary for patient specific instructions.  Future Appointments  Date Time Provider Department Center  08/07/2022  3:40 PM Kathleene Hazel, MD [facility]  10/08/2022 12:45 PM Corie Chiquito, PMHNP [facility] None    No orders of the defined types were placed in this encounter.   -------------------------------

## 2022-08-07 NOTE — Progress Notes (Signed)
Chief Complaint  Patient presents with   Follow-up    Palpitations    History of Present Illness: 62 yo female with history of HLD, HTN and palpitations (PACs/PVCs) here today for follow up. I saw her as a new patient for the evaluation of her hyperlipidemia and palpitations in June 2019. She has been tried on Lipitor and Crestor and developed a rash on her face and legs. At her first visit she described feeling her heart racing when in bed. No chest pain or dyspnea, LE edema or syncope. Also reported fatigue. I ordered an echo but she did not come in for the study. 48 hour cardiac monitor with rare PACs and PVCs. She was referred to the lipid clinic and started on Zetia and continued on Lovaza. She did not tolerate Zetia due to diarrhea. She was tried on Pravastatin but did not tolerate. She is now on Repatha and tolerating. Mild sleep apnea but she says she has not needed a device. She was [facility] to [facility] in March 2023 following a syncopal episode and was felt to be dehydrated with mild worsening of renal function. She had been dieting. Her HCTZ was stopped. Echo 04/17/21 with LVEF=60-65%, mild mitral regurgitation. She has been followed in our PharmD HTN clinic and has been on Norvasc and Coreg. She did not tolerate Lisinopril or Losartan due to cough, hydralazine due to nausea and did not tolerate aldactone due to rash and worsened renal function.   She is here today for follow up. The patient denies any chest pain, dyspnea, palpitations, lower extremity edema, orthopnea, PND, dizziness, near syncope or syncope.   Primary Care Physician: Amanda Joe, MD  Past Medical History:  Diagnosis Date   Anxiety    Asthma    Chronic kidney disease    Chronic kidney disease, stage 3 (HCC) 08/10/2019   Depression    Hyperlipidemia    Hypertension    Migraine     Past Surgical History:  Procedure Laterality Date   APPENDECTOMY     CARPAL TUNNEL RELEASE     rt   FOOT SURGERY  Bilateral    Plantar fasciitis   OOPHORECTOMY      Current Outpatient Medications  Medication Sig Dispense Refill   [START ON 09/04/2022] ALPRAZolam (XANAX) 1 MG tablet TAKE 1 TABLET AT BEDTIME AND 1/2-1 TABLET AS NEEDED FOR ANXIETY. 45 tablet 4   amLODipine (NORVASC) 5 MG tablet TAKE 1 TABLET(5 MG) BY MOUTH DAILY 30 tablet 0   busPIRone (BUSPAR) 15 MG tablet Take 1 tablet (15 mg total) by mouth 2 (two) times daily. 180 tablet 0   carvedilol (COREG) 25 MG tablet TAKE 1 TABLET(25 MG) BY MOUTH TWICE DAILY WITH A MEAL 180 tablet 0   Cholecalciferol (VITAMIN D3) 50 MCG (2000 UT) capsule Take 1 tablet by mouth every morning.     Cyanocobalamin (VITAMIN B-12 PO) Take 1 tablet by mouth every morning.     DULoxetine (CYMBALTA) 30 MG capsule Take 1 capsule (30 mg total) by mouth daily. Take with a 60 mg capsule to equal total dose of 90 mg. 90 capsule 0   DULoxetine (CYMBALTA) 60 MG capsule Take 1 capsule (60 mg total) by mouth daily. 90 capsule 0   esomeprazole (NEXIUM) 20 MG capsule Take 20 mg by mouth as needed.     Evolocumab (REPATHA SURECLICK) 140 MG/ML SOAJ Inject 140 mg into the skin every 14 (fourteen) days. 2 mL 11   ibuprofen (ADVIL) 200 MG tablet  Take 200 mg by mouth every 6 (six) hours as needed.     rizatriptan (MAXALT) 10 MG tablet Take 10 mg by mouth See admin instructions. Take one tablet (10 mg) by mouth at onset of migraine headache, may repeat in 2 hours if still needed.     [START ON 09/16/2022] zaleplon (SONATA) 10 MG capsule Take 1 capsule (10 mg total) by mouth at bedtime as needed for sleep. 30 capsule 5   No current facility-administered medications for this visit.    Allergies  Allergen Reactions   Ace Inhibitors Cough   Ambien [Zolpidem] Other (See Comments)    Other reaction(s): headache/neck throbbing   Amoxicillin Other (See Comments)    Gi upset   Codeine Nausea And Vomiting    Can tolerate hydrocodone if has food on stomach     Cortisone Other (See Comments)     Extremely high BP- reaction to injection   Felodipine Other (See Comments)     increased flushing and made her feel bad   Hydralazine     nausea   Hydrocodone-Acetaminophen Nausea And Vomiting    Must eat before and after each dose   Lisinopril Cough    Dry cough   Losartan Potassium Cough   Nexletol [Bempedoic Acid] Other (See Comments)    Brain fog   Venlafaxine Other (See Comments)    headaches/elevated blood pressure   Zetia [Ezetimibe] Diarrhea   Crestor [Rosuvastatin] Rash    facial rash   Lamictal [Lamotrigine] Rash   Spironolactone Rash    Increased scr and rash   Statins Rash    Face and leg rash with rosuvastatin, pravastatin, and atorvastatin    Social History   Socioeconomic History   Marital status: Married    Spouse name: Not on file   Number of children: 1   Years of education: Not on file   Highest education level: Not on file  Occupational History   Occupation: Agricultural engineer: [employer]  Tobacco Use   Smoking status: Former    Packs/day: 0.50    Years: 5.00    Additional pack years: 0.00    Total pack years: 2.50    Types: Cigarettes    Quit date: 01/29/1987    Years since quitting: 35.5   Smokeless tobacco: Never  Vaping Use   Vaping Use: Never used  Substance and Sexual Activity   Alcohol use: Yes    Comment: social ETOH only   Drug use: No   Sexual activity: Not on file  Other Topics Concern   Not on file  Social History Narrative   Not on file   Social Determinants of Health   Financial Resource Strain: Not on file  Food Insecurity: Not on file  Transportation Needs: Not on file  Physical Activity: Not on file  Stress: Not on file  Social Connections: Not on file  Intimate Partner Violence: Not on file    Family History  Problem Relation Age of Onset   Elevated Lipids Mother    Asthma Father    Heart disease Father        CAD s/p CABG   Elevated Lipids Father    Depression Father    Asthma Sister     Elevated Lipids Sister    Hypertension Sister    Depression Sister    Elevated Lipids Sister    Hypertension Sister    Bipolar disorder Sister    Elevated Lipids Sister    Hypertension Sister  Depression Sister    Asthma Paternal Aunt    Breast cancer Neg Hx     Review of Systems:  As stated in the HPI and otherwise negative.   LMP 03/23/2011   Physical Examination: General: Well developed, well nourished, NAD  HEENT: OP clear, mucus membranes moist  SKIN: warm, dry. No rashes. Neuro: No focal deficits  Musculoskeletal: Muscle strength 5/5 all ext  Psychiatric: Mood and affect normal  Neck: No JVD, no carotid bruits, no thyromegaly, no lymphadenopathy.  Lungs:Clear bilaterally, no wheezes, rhonci, crackles Cardiovascular: Regular rate and rhythm. No murmurs, gallops or rubs. Abdomen:Soft. Bowel sounds present. Non-tender.  Extremities: No lower extremity edema. Pulses are 2 + in the bilateral DP/PT.  EKG:  EKG is ordered today. The ekg ordered today demonstrates  EKG Interpretation Date/Time:  Wednesday August 07 2022 15:39:47 EDT Ventricular Rate:  76 PR Interval:  138 QRS Duration:  78 QT Interval:  378 QTC Calculation: 425 R Axis:   36  Text Interpretation: Normal sinus rhythm Low voltage QRS Cannot rule out Anterior infarct , age undetermined When compared with ECG of 16-Apr-2021 09:31, PREVIOUS ECG IS PRESENT Confirmed by Verne Carrow [phone]) on 08/07/2022 3:48:56 PM    Echo 04/17/21:  1. Left ventricular ejection fraction, by estimation, is 60 to 65%. The  left ventricle has normal function. The left ventricle has no regional  wall motion abnormalities. Left ventricular diastolic parameters are  consistent with Grade I diastolic  dysfunction (impaired relaxation).   2. Right ventricular systolic function is normal. The right ventricular  size is normal. There is normal pulmonary artery systolic pressure. The  estimated right ventricular systolic  pressure is 21.0 mmHg.   3. The mitral valve is normal in structure. Mild mitral valve  regurgitation. No evidence of mitral stenosis.   4. The aortic valve is tricuspid. Aortic valve regurgitation is not  visualized. No aortic stenosis is present.   5. The inferior vena cava is normal in size with greater than 50%  respiratory variability, suggesting right atrial pressure of 3 mmHg.   Recent Labs: 02/20/2022: BUN 11; Creatinine, Ser 1.05; Potassium 4.4; Sodium 142   Lipid Panel    Component Value Date/Time   CHOL 130 07/03/2020 1128   TRIG 221 (H) 07/03/2020 1128   HDL 60 07/03/2020 1128   CHOLHDL 2.2 07/03/2020 1128   CHOLHDL 4 05/25/2008 1036   VLDL 13.8 05/25/2008 1036   LDLCALC 36 07/03/2020 1128   LDLDIRECT 121.7 07/02/2007 0906     Wt Readings from Last 3 Encounters:  04/30/21 68 kg  04/27/21 68.5 kg  04/18/21 67.2 kg    Assessment and Plan:   1. Palpitations/PVCs/PACs: No palpitations  2. Hyperlipidemia:  She has not tolerated statins or Zetia. She is being followed in the lipid clinic. Now on Repatha. Will continue Repatha  3. Syncope: Felt to be due to dehydration. HCTZ has been stopped. No recurrent syncope  4. HTN: BP is well controlled on current therapy. No changes today. Note, intolerant of Ace-inh, ARB, aldactone and hydralazine.   Labs/ tests ordered today include:   Orders Placed This Encounter  Procedures   EKG 12-Lead   Disposition:   F/U with me in 12 months  Signed, Verne Carrow, MD 08/07/2022 3:37 PM    [facility] [address] [place], [place], [place]  [address] Phone: [phone]; Fax: [phone]

## 2022-08-07 NOTE — Patient Instructions (Signed)
Medication Instructions:  No changes *If you need a refill on your cardiac medications before your next appointment, please call your pharmacy*   Lab Work: none If you have labs (blood work) drawn today and your tests are completely normal, you will receive your results only by: MyChart Message (if you have MyChart) OR A paper copy in the mail If you have any lab test that is abnormal or we need to change your treatment, we will call you to review the results.   Testing/Procedures: none   Follow-Up: At [facility], you and your health needs are our priority.  As part of our continuing mission to provide you with exceptional heart care, we have created designated Provider Care Teams.  These Care Teams include your primary Cardiologist (physician) and Advanced Practice Providers (APPs -  Physician Assistants and Nurse Practitioners) who all work together to provide you with the care you need, when you need it.   Your next appointment:   12 month(s)  Provider:   Christopher McAlhany, MD      

## 2022-09-04 ENCOUNTER — Other Ambulatory Visit: Payer: Self-pay | Admitting: Cardiovascular Disease

## 2022-09-10 ENCOUNTER — Ambulatory Visit: Payer: BC Managed Care – PPO | Admitting: Cardiovascular Disease

## 2022-09-26 ENCOUNTER — Other Ambulatory Visit: Payer: Self-pay | Admitting: Cardiovascular Disease

## 2022-10-03 ENCOUNTER — Telehealth: Payer: Self-pay | Admitting: Psychiatry

## 2022-10-03 NOTE — Telephone Encounter (Signed)
PA submitted to [facility] and approved for VRAYLAR 1.5mg  cap 10/01/2022-10/01/2023, Ref #[id number]

## 2022-10-08 ENCOUNTER — Ambulatory Visit: Payer: BC Managed Care – PPO | Admitting: Psychiatry

## 2022-10-08 ENCOUNTER — Encounter: Payer: Self-pay | Admitting: Psychiatry

## 2022-10-08 DIAGNOSIS — F419 Anxiety disorder, unspecified: Secondary | ICD-10-CM

## 2022-10-08 DIAGNOSIS — F5104 Psychophysiologic insomnia: Secondary | ICD-10-CM | POA: Diagnosis not present

## 2022-10-08 DIAGNOSIS — F32A Depression, unspecified: Secondary | ICD-10-CM

## 2022-10-08 MED ORDER — PRAMIPEXOLE DIHYDROCHLORIDE 0.5 MG PO TABS
ORAL_TABLET | ORAL | 0 refills | Status: DC
Start: 2022-10-08 — End: 2022-10-23

## 2022-10-08 MED ORDER — BUSPIRONE HCL 15 MG PO TABS
15.0000 mg | ORAL_TABLET | Freq: Two times a day (BID) | ORAL | 0 refills | Status: DC
Start: 2022-10-08 — End: 2022-11-18

## 2022-10-08 MED ORDER — DULOXETINE HCL 30 MG PO CPEP
30.0000 mg | ORAL_CAPSULE | Freq: Every day | ORAL | 0 refills | Status: DC
Start: 2022-10-08 — End: 2022-11-18

## 2022-10-08 MED ORDER — DULOXETINE HCL 60 MG PO CPEP
60.0000 mg | ORAL_CAPSULE | Freq: Every day | ORAL | 0 refills | Status: DC
Start: 2022-10-08 — End: 2022-11-18

## 2022-10-08 NOTE — Progress Notes (Unsigned)
Amanda [name] [id number] Jun 13, 1960 62 y.o.  Subjective:   Patient ID:  Amanda [name] is a 62 y.o. (DOB 1960/06/11) female.  Chief Complaint: No chief complaint on file.   HPI Amanda [name] presents to the office today for follow-up of anxiety and depression.  She reports that her energy remains low. "I have absolutely no will, no want." Reports that she may stay home for a week at a time. She has been going to daughter's games. She reports that she has not been cooking.   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 She reports adequate concentration. She reports that she has been procrastinating. She reports that she is looking at her phone often, to include social media. She reports that she was not interested in football last weekend and turned it off. Occasional sad mood. She has been sleeping ok. She reports that she will eat- "I eat to live" and does not have an appetite or desire for food. Has not eaten today (1 pm). Denies SI.   Daughter has a few scholarship offers for field hockey. She reports some sadness about daughter being in her senior year and has some worry about her daughter going away to school next year.   She reports that she recently has been taking Xanax in the afternoons.   Past Psychiatric Medication Trials: Wellbutrin XL- No improvement at 300 mg dose. Trintellix Lexapro- "felt the best on." Reports that she had some hair loss Zoloft Celexa Viibryd Prozac Effexor Cymbalta Remeron Auvelity- Ineffective Ambien-Effective and then no longer effective. Sleep was not restful. Parasomnias. Lunesta-Ineffective Belsomra-Ineffective Trazodone-ineffective.  Doxepin-Ineffective Klonopin xanax Gabapentin Lamictal- Rash Lithium Abilify Rexulti- Akathisia. No benefit Latuda- Akathisia, clinching teeth, nausea, food cravings Vraylar Deplin ~~TMS  AIMS    Flowsheet Row Office Visit from 01/14/2022 in [facility] Office Visit from 11/06/2021 in [facility] Office Visit from 10/09/2021 in [facility]  AIMS Total Score 1 0 0      PHQ2-9    Flowsheet Row Office Visit from 12/11/2021 in [facility]  PHQ-2 Total Score 5  PHQ-9 Total Score 14      Flowsheet Row ED from 04/30/2021 in [facility] at [facility] ED to Hosp-Admission (Discharged) from 04/16/2021 in [facility]LOOR [facility]OLOG[facility] ED to Hosp-Admission (Discharged) from 03/21/2020 in [facility]  C-SSRS RISK CATEGORY No Risk No Risk No Risk        Review of Systems:  Review of Systems  Cardiovascular:  Positive for leg swelling.  Musculoskeletal:  Negative for gait problem.  Neurological:  Negative for tremors.  Psychiatric/Behavioral:         Please refer to HPI    Medications: {medication reviewed/display:3041432}  Current Outpatient Medications  Medication Sig Dispense Refill   ALPRAZolam (XANAX) 1 MG tablet TAKE 1 TABLET AT BEDTIME AND 1/2-1 TABLET AS NEEDED FOR ANXIETY. 45 tablet 4   amLODipine (NORVASC) 5 MG tablet Take 1 tablet (5 mg total) by mouth daily. 90 tablet 3   busPIRone (BUSPAR) 15 MG tablet Take 1 tablet (15 mg total) by mouth 2 (two) times daily. 180 tablet 0   carvedilol (COREG) 25 MG tablet TAKE 1 TABLET(25 MG) BY MOUTH TWICE DAILY WITH A MEAL 180 tablet 3   Cholecalciferol (VITAMIN D3) 50 MCG (2000 UT) capsule Take 1 tablet by  mouth every morning.     Cyanocobalamin (VITAMIN B-12 PO) Take 1 tablet by mouth every morning.     DULoxetine (CYMBALTA) 30 MG capsule Take 1 capsule (30 mg total) by mouth daily. Take with a 60 mg capsule to equal total dose of 90 mg. 90 capsule 0   DULoxetine (CYMBALTA) 60 MG capsule Take 1 capsule (60 mg total) by mouth daily. 90 capsule 0   esomeprazole (NEXIUM) 20 MG capsule Take 20 mg by mouth as  needed.     Evolocumab (REPATHA SURECLICK) 140 MG/ML SOAJ Inject 140 mg into the skin every 14 (fourteen) days. 2 mL 11   ibuprofen (ADVIL) 200 MG tablet Take 200 mg by mouth every 6 (six) hours as needed.     rizatriptan (MAXALT) 10 MG tablet Take 10 mg by mouth See admin instructions. Take one tablet (10 mg) by mouth at onset of migraine headache, may repeat in 2 hours if still needed.     zaleplon (SONATA) 10 MG capsule Take 1 capsule (10 mg total) by mouth at bedtime as needed for sleep. 30 capsule 5   No current facility-administered medications for this visit.    Medication Side Effects: {Medication Side Effects (Optional):21014029}  Allergies:  Allergies  Allergen Reactions   Ace Inhibitors Cough   Ambien [Zolpidem] Other (See Comments)    Other reaction(s): headache/neck throbbing   Amoxicillin Other (See Comments)    Gi upset   Codeine Nausea And Vomiting    Can tolerate hydrocodone if has food on stomach     Cortisone Other (See Comments)    Extremely high BP- reaction to injection   Felodipine Other (See Comments)     increased flushing and made her feel bad   Hydralazine     nausea   Hydrocodone-Acetaminophen Nausea And Vomiting    Must eat before and after each dose   Lisinopril Cough    Dry cough   Losartan Potassium Cough   Nexletol [Bempedoic Acid] Other (See Comments)    Brain fog   Venlafaxine Other (See Comments)    headaches/elevated blood pressure   Zetia [Ezetimibe] Diarrhea   Crestor [Rosuvastatin] Rash    facial rash   Lamictal [Lamotrigine] Rash   Spironolactone Rash    Increased scr and rash   Statins Rash    Face and leg rash with rosuvastatin, pravastatin, and atorvastatin    Past Medical History:  Diagnosis Date   Anxiety    Asthma    Chronic kidney disease    Chronic kidney disease, stage 3 (HCC) 08/10/2019   Depression    Hyperlipidemia    Hypertension    Migraine     Past Medical History, Surgical history, Social history, and  Family history were reviewed and updated as appropriate.   Please see review of systems for further details on the patient's review from today.   Objective:   Physical Exam:  LMP 03/23/2011   Physical Exam  Lab Review:     Component Value Date/Time   NA 142 02/20/2022 1449   K 4.4 02/20/2022 1449   CL 105 02/20/2022 1449   CO2 23 02/20/2022 1449   GLUCOSE 94 02/20/2022 1449   GLUCOSE 119 (H) 04/30/2021 1234   BUN 11 02/20/2022 1449   CREATININE 1.05 (H) 02/20/2022 1449   CALCIUM 10.2 02/20/2022 1449   PROT 7.6 04/30/2021 1234   PROT 6.6 07/03/2020 1128   ALBUMIN 4.2 04/30/2021 1234   ALBUMIN 3.9 07/03/2020 1128   AST 22 04/30/2021  1234   ALT 26 04/30/2021 1234   ALKPHOS 64 04/30/2021 1234   BILITOT 0.4 04/30/2021 1234   BILITOT 0.5 07/03/2020 1128   GFRNONAA >60 04/30/2021 1234   GFRAA 38 (L) 11/03/2019 1107       Component Value Date/Time   WBC 11.3 (H) 04/30/2021 1234   RBC 4.58 04/30/2021 1234   HGB 13.9 04/30/2021 1234   HCT 41.2 04/30/2021 1234   PLT 385 04/30/2021 1234   MCV 90.0 04/30/2021 1234   MCH 30.3 04/30/2021 1234   MCHC 33.7 04/30/2021 1234   RDW 12.5 04/30/2021 1234   LYMPHSABS 3.3 04/18/2021 0415   MONOABS 0.7 04/18/2021 0415   EOSABS 0.1 04/18/2021 0415   BASOSABS 0.1 04/18/2021 0415    No results found for: "POCLITH", "LITHIUM"   No results found for: "PHENYTOIN", "PHENOBARB", "VALPROATE", "CBMZ"   .res Assessment: Plan:     Pramipexole 1-5 mg daily target dose. 0.5 1/2 BID for 1 week, then 0.5 mg BID, then 1 BID  There are no diagnoses linked to this encounter.   Please see After Visit Summary for patient specific instructions.  No future appointments.  No orders of the defined types were placed in this encounter.   -------------------------------

## 2022-10-09 ENCOUNTER — Telehealth: Payer: Self-pay | Admitting: Psychiatry

## 2022-10-09 NOTE — Telephone Encounter (Signed)
Patient said she was notified by pharmacy that they were awaiting some information from the provider. I thought it might need a PA. I did not see anything in CMM or any notation in Epic that it required a PA. I called pharmacy and they said that it was an issue on their end and it had been resolved and they would fill it. Notified patient.

## 2022-10-09 NOTE — Telephone Encounter (Signed)
Patient lvm requesting clarification on the medication prescribed on yesterday.  Contact # [phone]

## 2022-10-23 MED ORDER — PRAMIPEXOLE DIHYDROCHLORIDE 1 MG PO TABS
1.0000 mg | ORAL_TABLET | Freq: Two times a day (BID) | ORAL | 0 refills | Status: DC
Start: 2022-10-23 — End: 2022-11-18

## 2022-10-31 MED ORDER — PRAMIPEXOLE DIHYDROCHLORIDE 0.5 MG PO TABS
ORAL_TABLET | ORAL | 0 refills | Status: DC
Start: 2022-10-31 — End: 2022-11-18

## 2022-10-31 NOTE — Telephone Encounter (Signed)
Patient started Pramipexole 0.5 mg tablets and is noticing some improvement in depression. Dose has been increased to 1 mg twice daily, however pharmacy has told pt that a prior authorization is needed for the 1 mg tablets. Please initiate prior authorization for 1 mg tablets.

## 2022-11-06 ENCOUNTER — Telehealth: Payer: Self-pay

## 2022-11-06 ENCOUNTER — Ambulatory Visit: Payer: BC Managed Care – PPO | Admitting: Psychiatry

## 2022-11-06 NOTE — Telephone Encounter (Signed)
Prior authorization initiated with [employer] for Pramipexole 1 mg #60/30 day

## 2022-11-07 NOTE — Telephone Encounter (Signed)
After initiating and submitting a PA for Pramipexole 1 mg bid with [employer] response was No PA required.   Will have staff contact pharmacy to make sure it will run okay.

## 2022-11-08 NOTE — Telephone Encounter (Signed)
Pharmacy doesn't open until 10  [phone]

## 2022-11-08 NOTE — Telephone Encounter (Signed)
Patient notified

## 2022-11-08 NOTE — Telephone Encounter (Signed)
Pharmacy said the issue was resolved and they are filling it now.

## 2022-11-18 ENCOUNTER — Encounter: Payer: Self-pay | Admitting: Psychiatry

## 2022-11-18 ENCOUNTER — Ambulatory Visit: Payer: BC Managed Care – PPO | Admitting: Psychiatry

## 2022-11-18 DIAGNOSIS — F5104 Psychophysiologic insomnia: Secondary | ICD-10-CM

## 2022-11-18 DIAGNOSIS — F32A Depression, unspecified: Secondary | ICD-10-CM

## 2022-11-18 DIAGNOSIS — F419 Anxiety disorder, unspecified: Secondary | ICD-10-CM

## 2022-11-18 MED ORDER — DULOXETINE HCL 60 MG PO CPEP
60.0000 mg | ORAL_CAPSULE | Freq: Every day | ORAL | 0 refills | Status: DC
Start: 2022-11-18 — End: 2023-02-04

## 2022-11-18 MED ORDER — DULOXETINE HCL 30 MG PO CPEP
30.0000 mg | ORAL_CAPSULE | Freq: Every day | ORAL | 0 refills | Status: DC
Start: 2022-11-18 — End: 2023-02-04

## 2022-11-18 MED ORDER — PRAMIPEXOLE DIHYDROCHLORIDE 1 MG PO TABS
1.0000 mg | ORAL_TABLET | Freq: Two times a day (BID) | ORAL | 0 refills | Status: DC
Start: 2022-11-18 — End: 2023-02-04

## 2022-11-18 MED ORDER — BUSPIRONE HCL 15 MG PO TABS
15.0000 mg | ORAL_TABLET | Freq: Two times a day (BID) | ORAL | 0 refills | Status: DC
Start: 2022-11-18 — End: 2023-02-04

## 2022-11-18 NOTE — Progress Notes (Signed)
Amanda [name] [id number] Jul 08, 1960 62 y.o.  Subjective:   Patient ID:  Amanda [name] is a 62 y.o. (DOB Dec 05, 1960) female.  Chief Complaint:  Chief Complaint  Patient presents with   Follow-up    Depression, anxiety, and insomnia    HPI [name] presents to the office today for follow-up of anxiety and depression. She reports that Pramipexole has been helpful for her depression. She reports that she is "getting out some, not a lot." Reports that wife would like her to get out more. She has been going to daughter's field hockey games. She reports that she occasionally will go get lunch. She reports, "I'm not depressed." She reports that she has been finding things to do around the house. She has been cleaning some. Continues to day trade. Anxiety has been "pretty good." She reports that she is better able to "block it out" in terms of anxious or negative thoughts.Enjoys the stock market and watching things on social media about hurricane in [place]. Concentration has been "real good" recently. She notices she feels a "little fuzzy" first thing in the morning and this clears up quickly. She reports that she has had less of an appetite. She reports that she has been eating enough. Sleeping well. She has been taking some hour long naps in the afternoon. Denies SI.   She reports that she has been having some diarrhea with Pramipexole. She has been taking Imodium to manage diarrhea.   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   They will be traveling for the holidays.  She reports that she is not needing Sonata prn nightly and taking it only as needed.   Daughter will be going to [employer] in [place], [place] and will be a little over 2 hours from home. Daughter is relieved to have committed to a school.   Alprazolam last filled 10/03/22. Zaleplon last filled 09/25/22.   Past Psychiatric Medication  Trials: Wellbutrin XL- No improvement at 300 mg dose. Trintellix Lexapro- "felt the best on." Reports that she had some hair loss Zoloft Celexa Viibryd Prozac Effexor Cymbalta Remeron Auvelity- Ineffective Ambien-Effective and then no longer effective. Sleep was not restful. Parasomnias. Lunesta-Ineffective Belsomra-Ineffective Trazodone-ineffective.  Doxepin-Ineffective Klonopin xanax Gabapentin Lamictal- Rash Lithium Abilify Rexulti- Akathisia. No benefit Latuda- Akathisia, clinching teeth, nausea, food cravings Vraylar Deplin ~~TMS    AIMS    Flowsheet Row Office Visit from 01/14/2022 in [facility] Office Visit from 11/06/2021 in [facility] Office Visit from 10/09/2021 in [facility]  AIMS Total Score 1 0 0      PHQ2-9    Flowsheet Row Office Visit from 12/11/2021 in [facility]  PHQ-2 Total Score 5  PHQ-9 Total Score 14      Flowsheet Row ED from 04/30/2021 in [facility] Emergency Department at [facility] ED to Hosp-Admission (Discharged) from 04/16/2021 in [place] [facility] 4TH FLOOR [facility]ARE [facility] UROLOGY ED to Hosp-Admission (Discharged) from 03/21/2020 in [place] [facility]  C-SSRS RISK CATEGORY No Risk No Risk No Risk        Review of Systems:  Review of Systems  Cardiovascular:  Positive for leg swelling.  Gastrointestinal:  Positive for diarrhea and nausea.  Musculoskeletal:  Negative for gait problem.  Psychiatric/Behavioral:         Please refer to  HPI    Medications: I have reviewed the patient's current medications.  Current Outpatient Medications  Medication Sig Dispense Refill   ALPRAZolam (XANAX) 1 MG tablet TAKE 1 TABLET AT BEDTIME AND 1/2-1 TABLET AS NEEDED FOR ANXIETY. 45 tablet 4   amLODipine (NORVASC) 5 MG tablet Take 1 tablet (5 mg total) by mouth daily. 90 tablet 3   carvedilol (COREG)  25 MG tablet TAKE 1 TABLET(25 MG) BY MOUTH TWICE DAILY WITH A MEAL 180 tablet 3   Cholecalciferol (VITAMIN D3) 50 MCG (2000 UT) capsule Take 1 tablet by mouth every morning.     Cyanocobalamin (VITAMIN B-12 PO) Take 1 tablet by mouth every morning.     rizatriptan (MAXALT) 10 MG tablet Take 10 mg by mouth See admin instructions. Take one tablet (10 mg) by mouth at onset of migraine headache, may repeat in 2 hours if still needed.     busPIRone (BUSPAR) 15 MG tablet Take 1 tablet (15 mg total) by mouth 2 (two) times daily. 180 tablet 0   DULoxetine (CYMBALTA) 30 MG capsule Take 1 capsule (30 mg total) by mouth daily. Take with a 60 mg capsule to equal total dose of 90 mg. 90 capsule 0   DULoxetine (CYMBALTA) 60 MG capsule Take 1 capsule (60 mg total) by mouth daily. 90 capsule 0   esomeprazole (NEXIUM) 20 MG capsule Take 20 mg by mouth as needed.     Evolocumab (REPATHA SURECLICK) 140 MG/ML SOAJ Inject 140 mg into the skin every 14 (fourteen) days. 2 mL 11   ibuprofen (ADVIL) 200 MG tablet Take 200 mg by mouth every 6 (six) hours as needed.     pramipexole (MIRAPEX) 1 MG tablet Take 1 tablet (1 mg total) by mouth 2 (two) times daily. 180 tablet 0   zaleplon (SONATA) 10 MG capsule Take 1 capsule (10 mg total) by mouth at bedtime as needed for sleep. 30 capsule 5   No current facility-administered medications for this visit.    Medication Side Effects: Nausea and Other: Diarrhea  Allergies:  Allergies  Allergen Reactions   Ace Inhibitors Cough   Ambien [Zolpidem] Other (See Comments)    Other reaction(s): headache/neck throbbing   Amoxicillin Other (See Comments)    Gi upset   Codeine Nausea And Vomiting    Can tolerate hydrocodone if has food on stomach     Cortisone Other (See Comments)    Extremely high BP- reaction to injection   Felodipine Other (See Comments)     increased flushing and made her feel bad   Hydralazine     nausea   Hydrocodone-Acetaminophen Nausea And Vomiting     Must eat before and after each dose   Lisinopril Cough    Dry cough   Losartan Potassium Cough   Nexletol [Bempedoic Acid] Other (See Comments)    Brain fog   Venlafaxine Other (See Comments)    headaches/elevated blood pressure   Zetia [Ezetimibe] Diarrhea   Crestor [Rosuvastatin] Rash    facial rash   Lamictal [Lamotrigine] Rash   Spironolactone Rash    Increased scr and rash   Statins Rash    Face and leg rash with rosuvastatin, pravastatin, and atorvastatin    Past Medical History:  Diagnosis Date   Anxiety    Asthma    Chronic kidney disease    Chronic kidney disease, stage 3 (HCC) 08/10/2019   Depression    Hyperlipidemia    Hypertension    Migraine  Past Medical History, Surgical history, Social history, and Family history were reviewed and updated as appropriate.   Please see review of systems for further details on the patient's review from today.   Objective:   Physical Exam:  Wt 154 lb (69.9 kg)   LMP 03/23/2011   BMI 30.08 kg/m   Physical Exam Constitutional:      General: She is not in acute distress. Musculoskeletal:        General: No deformity.  Neurological:     Mental Status: She is alert and oriented to person, place, and time.     Coordination: Coordination normal.  Psychiatric:        Attention and Perception: Attention and perception normal. She does not perceive auditory or visual hallucinations.        Mood and Affect: Mood is not anxious or depressed. Affect is blunt. Affect is not labile, angry or inappropriate.        Speech: Speech normal.        Behavior: Behavior normal.        Thought Content: Thought content normal. Thought content is not paranoid or delusional. Thought content does not include homicidal or suicidal ideation. Thought content does not include homicidal or suicidal plan.        Cognition and Memory: Cognition and memory normal.        Judgment: Judgment normal.     Comments: Insight intact     Lab Review:      Component Value Date/Time   NA 142 02/20/2022 1449   K 4.4 02/20/2022 1449   CL 105 02/20/2022 1449   CO2 23 02/20/2022 1449   GLUCOSE 94 02/20/2022 1449   GLUCOSE 119 (H) 04/30/2021 1234   BUN 11 02/20/2022 1449   CREATININE 1.05 (H) 02/20/2022 1449   CALCIUM 10.2 02/20/2022 1449   PROT 7.6 04/30/2021 1234   PROT 6.6 07/03/2020 1128   ALBUMIN 4.2 04/30/2021 1234   ALBUMIN 3.9 07/03/2020 1128   AST 22 04/30/2021 1234   ALT 26 04/30/2021 1234   ALKPHOS 64 04/30/2021 1234   BILITOT 0.4 04/30/2021 1234   BILITOT 0.5 07/03/2020 1128   GFRNONAA >60 04/30/2021 1234   GFRAA 38 (L) 11/03/2019 1107       Component Value Date/Time   WBC 11.3 (H) 04/30/2021 1234   RBC 4.58 04/30/2021 1234   HGB 13.9 04/30/2021 1234   HCT 41.2 04/30/2021 1234   PLT 385 04/30/2021 1234   MCV 90.0 04/30/2021 1234   MCH 30.3 04/30/2021 1234   MCHC 33.7 04/30/2021 1234   RDW 12.5 04/30/2021 1234   LYMPHSABS 3.3 04/18/2021 0415   MONOABS 0.7 04/18/2021 0415   EOSABS 0.1 04/18/2021 0415   BASOSABS 0.1 04/18/2021 0415    No results found for: "POCLITH", "LITHIUM"   No results found for: "PHENYTOIN", "PHENOBARB", "VALPROATE", "CBMZ"   .res Assessment: Plan:   33 minutes spent dedicated to the care of this patient on the date of this encounter to include pre-visit review of records, ordering of medication, post visit documentation, and face-to-face time with the patient discussing response to Pramipexole and risk versus benefits. Pt reports that her mood has improved with Pramipexole and she no longer feels depressed. She reports that she is experiencing some mild nausea and diarrhea that is controlled with Imodium as needed. She reports that side effects are tolerable and benefits are outweighing side effects. Discussed continuing current dose since side effects may worsen with increased dose. Pt reports that she  may consider dose increase in the future if she experiences recurrence of depression in  the future. Will continue Pramipexole 1 mg po BID for off-label indication of depression.  Continue Buspar 15 mg po BID for anxiety.  Continue Cymbalta 90 mg daily for anxiety and depression.  Continue Alprazolam 1 mg at bedtime and 1/2-1 tablets as needed for anxiety.  Continue Sonata 10 mg at bedtime as needed for insomnia.  Pt to follow-up in 2-3 months or sooner if clinically indicated.  Patient advised to contact office with any questions, adverse effects, or acute worsening in signs and symptoms.   Jorene was seen today for follow-up.  Diagnoses and all orders for this visit:  Anxiety -     busPIRone (BUSPAR) 15 MG tablet; Take 1 tablet (15 mg total) by mouth 2 (two) times daily. -     DULoxetine (CYMBALTA) 60 MG capsule; Take 1 capsule (60 mg total) by mouth daily. -     DULoxetine (CYMBALTA) 30 MG capsule; Take 1 capsule (30 mg total) by mouth daily. Take with a 60 mg capsule to equal total dose of 90 mg.  Depression, unspecified depression type -     DULoxetine (CYMBALTA) 60 MG capsule; Take 1 capsule (60 mg total) by mouth daily. -     DULoxetine (CYMBALTA) 30 MG capsule; Take 1 capsule (30 mg total) by mouth daily. Take with a 60 mg capsule to equal total dose of 90 mg. -     pramipexole (MIRAPEX) 1 MG tablet; Take 1 tablet (1 mg total) by mouth 2 (two) times daily.  Chronic insomnia -     DULoxetine (CYMBALTA) 60 MG capsule; Take 1 capsule (60 mg total) by mouth daily.     Please see After Visit Summary for patient specific instructions.  Future Appointments  Date Time Provider Department Center  02/04/2023 11:30 AM Corie Chiquito, PMHNP [facility] None    No orders of the defined types were placed in this encounter.   -------------------------------

## 2022-11-22 DIAGNOSIS — E559 Vitamin D deficiency, unspecified: Secondary | ICD-10-CM | POA: Diagnosis not present

## 2022-11-22 DIAGNOSIS — G43909 Migraine, unspecified, not intractable, without status migrainosus: Secondary | ICD-10-CM | POA: Diagnosis not present

## 2022-11-22 DIAGNOSIS — E538 Deficiency of other specified B group vitamins: Secondary | ICD-10-CM | POA: Diagnosis not present

## 2022-11-22 DIAGNOSIS — I129 Hypertensive chronic kidney disease with stage 1 through stage 4 chronic kidney disease, or unspecified chronic kidney disease: Secondary | ICD-10-CM | POA: Diagnosis not present

## 2022-11-22 DIAGNOSIS — E782 Mixed hyperlipidemia: Secondary | ICD-10-CM | POA: Diagnosis not present

## 2022-11-22 DIAGNOSIS — Z23 Encounter for immunization: Secondary | ICD-10-CM | POA: Diagnosis not present

## 2022-11-22 DIAGNOSIS — N1832 Chronic kidney disease, stage 3b: Secondary | ICD-10-CM | POA: Diagnosis not present

## 2022-12-11 ENCOUNTER — Encounter: Payer: Self-pay | Admitting: Psychiatry

## 2023-01-15 ENCOUNTER — Other Ambulatory Visit: Payer: Self-pay | Admitting: Cardiovascular Disease

## 2023-01-17 DIAGNOSIS — J0181 Other acute recurrent sinusitis: Secondary | ICD-10-CM | POA: Diagnosis not present

## 2023-02-04 ENCOUNTER — Encounter: Payer: Self-pay | Admitting: Psychiatry

## 2023-02-04 ENCOUNTER — Ambulatory Visit (INDEPENDENT_AMBULATORY_CARE_PROVIDER_SITE_OTHER): Payer: BC Managed Care – PPO | Admitting: Psychiatry

## 2023-02-04 DIAGNOSIS — F32A Depression, unspecified: Secondary | ICD-10-CM

## 2023-02-04 DIAGNOSIS — F5104 Psychophysiologic insomnia: Secondary | ICD-10-CM | POA: Diagnosis not present

## 2023-02-04 DIAGNOSIS — F419 Anxiety disorder, unspecified: Secondary | ICD-10-CM

## 2023-02-04 MED ORDER — DULOXETINE HCL 30 MG PO CPEP: 30.0000 mg | ORAL_CAPSULE | Freq: Every day | ORAL | 1 refills | Status: DC

## 2023-02-04 MED ORDER — DULOXETINE HCL 60 MG PO CPEP: 60.0000 mg | ORAL_CAPSULE | Freq: Every day | ORAL | 1 refills | Status: DC

## 2023-02-04 MED ORDER — BUSPIRONE HCL 15 MG PO TABS: 15.0000 mg | ORAL_TABLET | Freq: Two times a day (BID) | ORAL | 1 refills | Status: AC

## 2023-02-04 MED ORDER — ZALEPLON 10 MG PO CAPS: 10.0000 mg | ORAL_CAPSULE | Freq: Every evening | ORAL | 5 refills | Status: DC | PRN

## 2023-02-04 MED ORDER — PRAMIPEXOLE DIHYDROCHLORIDE 0.5 MG PO TABS
ORAL_TABLET | ORAL | 0 refills | Status: DC
Start: 2023-02-04 — End: 2023-05-06

## 2023-02-04 MED ORDER — ALPRAZOLAM 1 MG PO TABS: ORAL_TABLET | ORAL | 5 refills | Status: DC

## 2023-02-04 NOTE — Progress Notes (Signed)
 Amanda [name] [id number] 11/15/1960 63 y.o.  Subjective:   Patient ID:  Amanda [name] is a 63 y.o. (DOB [birth year]) female.  Chief Complaint:  Chief Complaint  Patient presents with   Medication Problem    GI side effects with Pramipexole     HPI Amanda [name] presents to the office today for follow-up of depression, anxiety, and insomnia. She would like to come off Pramipexole  due to nausea and diarrhea daily. She reports that she forgot to take Pramipexole  one day and noticed GI symptoms were improved. She reports, I'm not depressed. She feels that Pramipexole  was helpful for depression. She has limited her activities away from home due to GI side effects.   She reports that her anxiety has been pretty good. She had some anxiety with travel delays and changes. Denies panic attacks. She reports that she has had middle of the awakenings and has been able to return to sleep easily. Energy and motivation remain low. Family told her they were proud that she went on trip and did as many activities as she did. Appetite has been too good. Denies anhedonia. She enjoys day trading and food. She enjoys watching movies and football. She reports that concentration has been good. She worked all day Saturday and Sunday. She has been working more compared to the past. She prefers to work from home instead of going into the office. Denies SI.   She and her family went to [place] and enjoyed it.   Taking Zaleplon  most nights.   Alprazolam  last filled 01/31/23 x 5. Zaleplon  last filled 01/10/23.  Past Psychiatric Medication Trials: Wellbutrin  XL- No improvement at 300 mg dose. Trintellix Lexapro - felt the best on. Reports that she had some hair loss Zoloft Celexa Viibryd Prozac Effexor Cymbalta  Remeron Auvelity - Ineffective Ambien-Effective and then no longer effective. Sleep was not restful. Parasomnias. Lunesta-Ineffective Belsomra-Ineffective Trazodone -ineffective.   Doxepin -Ineffective Klonopin  xanax  Gabapentin Lamictal - Rash Lithium  Abilify Rexulti - Akathisia. No benefit Latuda - Akathisia, clinching teeth, nausea, food cravings Vraylar  Deplin ~~TMS  AIMS    Flowsheet Row Office Visit from 01/14/2022 in [facility] Office Visit from 11/06/2021 in [facility] Office Visit from 10/09/2021 in [facility]  AIMS Total Score 1 0 0      PHQ2-9    Flowsheet Row Office Visit from 12/11/2021 in [facility]  PHQ-2 Total Score 5  PHQ-9 Total Score 14      Flowsheet Row ED from 04/30/2021 in [facility] Emergency Department at [facility] ED to Hosp-Admission (Discharged) from 04/16/2021 in [place] [facility]LO[facility] ED to Hosp-Admission (Discharged) from 03/21/2020 in [place] [facility]U  C-SSRS RISK CATEGORY No Risk No Risk No Risk        Review of Systems:  Review of Systems  HENT:  Positive for postnasal drip.   Respiratory:  Positive for cough.   Cardiovascular:  Positive for leg swelling.  Gastrointestinal:  Positive for diarrhea and nausea.  Musculoskeletal:  Negative for gait problem.  Psychiatric/Behavioral:         Please refer to HPI    Medications: I have reviewed the patient's current medications.  Current Outpatient Medications  Medication Sig Dispense Refill   amLODipine  (NORVASC ) 5 MG tablet Take 1 tablet (5 mg total) by mouth daily. 90 tablet 3   carvedilol  (COREG ) 25 MG tablet TAKE 1 TABLET(25 MG) BY MOUTH TWICE DAILY WITH A MEAL 180  tablet 3   Cholecalciferol (VITAMIN D3) 50 MCG (2000 UT) capsule Take 1 tablet by mouth every morning.     Cyanocobalamin  (VITAMIN B-12 PO) Take 1 tablet by mouth every morning.     esomeprazole  (NEXIUM ) 20 MG capsule Take 20 mg by mouth as needed.     ibuprofen (ADVIL) 200 MG tablet Take 200 mg by mouth every 6 (six) hours as  needed.     REPATHA  SURECLICK 140 MG/ML SOAJ ADMINISTER 1 ML UNDER THE SKIN EVERY 14 DAYS 2 mL 11   rizatriptan (MAXALT) 10 MG tablet Take 10 mg by mouth See admin instructions. Take one tablet (10 mg) by mouth at onset of migraine headache, may repeat in 2 hours if still needed.     [START ON 02/28/2023] ALPRAZolam  (XANAX ) 1 MG tablet TAKE 1 TABLET AT BEDTIME AND 1/2-1 TABLET AS NEEDED FOR ANXIETY. 45 tablet 5   busPIRone  (BUSPAR ) 15 MG tablet Take 1 tablet (15 mg total) by mouth 2 (two) times daily. 180 tablet 1   DULoxetine  (CYMBALTA ) 30 MG capsule Take 1 capsule (30 mg total) by mouth daily. Take with a 60 mg capsule to equal total dose of 90 mg. 90 capsule 1   DULoxetine  (CYMBALTA ) 60 MG capsule Take 1 capsule (60 mg total) by mouth daily. 90 capsule 1   pramipexole  (MIRAPEX ) 0.5 MG tablet Take 1 tablet twice daily for one week, then take 1/2 tablet twice daily for one week, then stop 30 tablet 0   [START ON 02/07/2023] zaleplon  (SONATA ) 10 MG capsule Take 1 capsule (10 mg total) by mouth at bedtime as needed for sleep. 30 capsule 5   No current facility-administered medications for this visit.    Medication Side Effects: Nausea and Other: Diarrhea  Allergies:  Allergies  Allergen Reactions   Ace Inhibitors Cough   Ambien [Zolpidem] Other (See Comments)    Other reaction(s): headache/neck throbbing   Amoxicillin Other (See Comments)    Gi upset   Codeine Nausea And Vomiting    Can tolerate hydrocodone if has food on stomach     Cortisone Other (See Comments)    Extremely high BP- reaction to injection   Felodipine Other (See Comments)     increased flushing and made her feel bad   Hydralazine      nausea   Hydrocodone-Acetaminophen  Nausea And Vomiting    Must eat before and after each dose   Lisinopril  Cough    Dry cough   Losartan Potassium Cough   Nexletol  [Bempedoic Acid ] Other (See Comments)    Brain fog   Venlafaxine Other (See Comments)    headaches/elevated blood  pressure   Zetia  [Ezetimibe ] Diarrhea   Crestor [Rosuvastatin] Rash    facial rash   Lamictal  [Lamotrigine ] Rash   Spironolactone  Rash    Increased scr and rash   Statins Rash    Face and leg rash with rosuvastatin, pravastatin , and atorvastatin    Past Medical History:  Diagnosis Date   Anxiety    Asthma    Chronic kidney disease    Chronic kidney disease, stage 3 (HCC) 08/10/2019   Depression    Hyperlipidemia    Hypertension    Migraine     Past Medical History, Surgical history, Social history, and Family history were reviewed and updated as appropriate.   Please see review of systems for further details on the patient's review from today.   Objective:   Physical Exam:  LMP 03/23/2011   Physical Exam Neurological:  Mental Status: She is alert and oriented to person, place, and time.     Cranial Nerves: No dysarthria.  Psychiatric:        Attention and Perception: Attention and perception normal.        Speech: Speech normal.        Behavior: Behavior is cooperative.        Thought Content: Thought content normal. Thought content is not paranoid or delusional. Thought content does not include homicidal or suicidal ideation. Thought content does not include homicidal or suicidal plan.        Cognition and Memory: Cognition and memory normal.        Judgment: Judgment normal.     Comments: Insight intact Mood is mildly depressed     Lab Review:     Component Value Date/Time   NA 142 02/20/2022 1449   K 4.4 02/20/2022 1449   CL 105 02/20/2022 1449   CO2 23 02/20/2022 1449   GLUCOSE 94 02/20/2022 1449   GLUCOSE 119 (H) 04/30/2021 1234   BUN 11 02/20/2022 1449   CREATININE 1.05 (H) 02/20/2022 1449   CALCIUM 10.2 02/20/2022 1449   PROT 7.6 04/30/2021 1234   PROT 6.6 07/03/2020 1128   ALBUMIN 4.2 04/30/2021 1234   ALBUMIN 3.9 07/03/2020 1128   AST 22 04/30/2021 1234   ALT 26 04/30/2021 1234   ALKPHOS 64 04/30/2021 1234   BILITOT 0.4 04/30/2021 1234    BILITOT 0.5 07/03/2020 1128   GFRNONAA >60 04/30/2021 1234   GFRAA 38 (L) 11/03/2019 1107       Component Value Date/Time   WBC 11.3 (H) 04/30/2021 1234   RBC 4.58 04/30/2021 1234   HGB 13.9 04/30/2021 1234   HCT 41.2 04/30/2021 1234   PLT 385 04/30/2021 1234   MCV 90.0 04/30/2021 1234   MCH 30.3 04/30/2021 1234   MCHC 33.7 04/30/2021 1234   RDW 12.5 04/30/2021 1234   LYMPHSABS 3.3 04/18/2021 0415   MONOABS 0.7 04/18/2021 0415   EOSABS 0.1 04/18/2021 0415   BASOSABS 0.1 04/18/2021 0415    No results found for: POCLITH, LITHIUM    No results found for: PHENYTOIN, PHENOBARB, VALPROATE, CBMZ   .res Assessment: Plan:    32 minutes spent dedicated to the care of this patient on the date of this encounter to include pre-visit review of records, ordering of medication, post visit documentation, and face-to-face time with the patient discussing possible GI side effects with Pramipexole . Pt reports that she notices GI side effects resolve when she has missed Pramipexole , however she has not felt well with missed doses. She reports that she would like to taper off Pramipexole . Will decrease Pramipexole  from 1 mg twice daily to 0.5 mg twice daily for one week, then 0.25 mg twice daily for one week, then stop.  Will continue Cymbalta  90 mg daily for depression and anxiety.  Continue Buspar  15 mg twice daily for anxiety.  Continue Alprazolam  1 mg at bedtime and 1/2-1 tablet as needed for anxiety.  Continue Sonata  10 mg at bedtime as needed for insomnia.  Pt to follow-up in 2-3 months or sooner if clinically indicated.  Patient advised to contact office with any questions, adverse effects, or acute worsening in signs and symptoms.   Amandajo was seen today for medication problem.  Diagnoses and all orders for this visit:  Depression, unspecified depression type -     pramipexole  (MIRAPEX ) 0.5 MG tablet; Take 1 tablet twice daily for one week, then take 1/2 tablet  twice daily for  one week, then stop -     DULoxetine  (CYMBALTA ) 30 MG capsule; Take 1 capsule (30 mg total) by mouth daily. Take with a 60 mg capsule to equal total dose of 90 mg. -     DULoxetine  (CYMBALTA ) 60 MG capsule; Take 1 capsule (60 mg total) by mouth daily.  Anxiety -     busPIRone  (BUSPAR ) 15 MG tablet; Take 1 tablet (15 mg total) by mouth 2 (two) times daily. -     DULoxetine  (CYMBALTA ) 30 MG capsule; Take 1 capsule (30 mg total) by mouth daily. Take with a 60 mg capsule to equal total dose of 90 mg. -     DULoxetine  (CYMBALTA ) 60 MG capsule; Take 1 capsule (60 mg total) by mouth daily. -     ALPRAZolam  (XANAX ) 1 MG tablet; TAKE 1 TABLET AT BEDTIME AND 1/2-1 TABLET AS NEEDED FOR ANXIETY.  Chronic insomnia -     DULoxetine  (CYMBALTA ) 60 MG capsule; Take 1 capsule (60 mg total) by mouth daily. -     ALPRAZolam  (XANAX ) 1 MG tablet; TAKE 1 TABLET AT BEDTIME AND 1/2-1 TABLET AS NEEDED FOR ANXIETY. -     zaleplon  (SONATA ) 10 MG capsule; Take 1 capsule (10 mg total) by mouth at bedtime as needed for sleep.     Please see After Visit Summary for patient specific instructions.  No future appointments.   No orders of the defined types were placed in this encounter.   -------------------------------

## 2023-03-03 DIAGNOSIS — F4310 Post-traumatic stress disorder, unspecified: Secondary | ICD-10-CM | POA: Diagnosis not present

## 2023-03-10 DIAGNOSIS — F4321 Adjustment disorder with depressed mood: Secondary | ICD-10-CM | POA: Diagnosis not present

## 2023-03-18 DIAGNOSIS — F4321 Adjustment disorder with depressed mood: Secondary | ICD-10-CM | POA: Diagnosis not present

## 2023-03-24 DIAGNOSIS — F4321 Adjustment disorder with depressed mood: Secondary | ICD-10-CM | POA: Diagnosis not present

## 2023-04-01 DIAGNOSIS — F4321 Adjustment disorder with depressed mood: Secondary | ICD-10-CM | POA: Diagnosis not present

## 2023-04-02 DIAGNOSIS — G4700 Insomnia, unspecified: Secondary | ICD-10-CM | POA: Diagnosis not present

## 2023-04-02 DIAGNOSIS — F411 Generalized anxiety disorder: Secondary | ICD-10-CM | POA: Diagnosis not present

## 2023-04-02 DIAGNOSIS — F332 Major depressive disorder, recurrent severe without psychotic features: Secondary | ICD-10-CM | POA: Diagnosis not present

## 2023-04-10 DIAGNOSIS — F4321 Adjustment disorder with depressed mood: Secondary | ICD-10-CM | POA: Diagnosis not present

## 2023-04-16 DIAGNOSIS — F4321 Adjustment disorder with depressed mood: Secondary | ICD-10-CM | POA: Diagnosis not present

## 2023-04-23 DIAGNOSIS — F4321 Adjustment disorder with depressed mood: Secondary | ICD-10-CM | POA: Diagnosis not present

## 2023-04-27 ENCOUNTER — Encounter: Payer: Self-pay | Admitting: Cardiovascular Disease

## 2023-04-29 DIAGNOSIS — G4700 Insomnia, unspecified: Secondary | ICD-10-CM | POA: Diagnosis not present

## 2023-04-29 DIAGNOSIS — F3341 Major depressive disorder, recurrent, in partial remission: Secondary | ICD-10-CM | POA: Diagnosis not present

## 2023-04-29 DIAGNOSIS — F411 Generalized anxiety disorder: Secondary | ICD-10-CM | POA: Diagnosis not present

## 2023-05-01 DIAGNOSIS — F4321 Adjustment disorder with depressed mood: Secondary | ICD-10-CM | POA: Diagnosis not present

## 2023-05-01 NOTE — Telephone Encounter (Signed)
 I had cancellation for Monday. Scheduled pt for Monday at 3:15. Called pt and she said she would be here.

## 2023-05-01 NOTE — Telephone Encounter (Signed)
 I would have her stop her Norvasc. She can follow up with her primary care and bring in her blood pressure readings if she needs additional therapy. [facility] patient and informed. She said that her PCP referred her here because of our HTN clinic.  She has been seen by PharmD already.  She voices understanding to stop amlodipine and monitor swelling.  Her SOB occurs with exercise/activity and sometimes wheezing.  Denies asthma. No chest tightness or pain.   She would like for the pharmacist to review medications and make call her if possible and make recommendations.   Her home BPs this week have been 150-160/80s - mid afternoon readings.

## 2023-05-05 ENCOUNTER — Ambulatory Visit: Attending: Cardiovascular Disease | Admitting: Pharmacist

## 2023-05-05 VITALS — BP 150/90 | HR 69

## 2023-05-05 DIAGNOSIS — I10 Essential (primary) hypertension: Secondary | ICD-10-CM

## 2023-05-05 NOTE — Progress Notes (Unsigned)
 Patient ID: Amanda [name]                 DOB: Sep 19, 1960                      MRN: [id number]      HPI: Amanda [name] is a 63 y.o. female referred by Dr. Clifton James to HTN clinic. PMH is significant for HTN, HLD, palpitations (PACs/PVCs), CKD3, asthma, GERD, and anxiety/depression.   At last pharmacy visit 02/20/22, patient brought in four BP readings that were fairly well controlled. Home cuff previously validated, BP 107-121/75-88 at home and 120/76 in clinic, HR 68. Had not been walking for exercise, patient reports not having much energy. Had red bumps (almost look like pimples) that come and go. Having cramping. Advised to see PCP. No dizziness/swelling at this visit. Continued carvedilol 25 mg twice a day and amlodipine 5mg  daily with follow up as needed.  At last cardiology visit on 08/07/22, BP was well controlled on carvedilol 25 mg BID and amlodipine 5 mg. BP 122/70 in office, HR 76. No symptoms of chest pain, dyspnea, palpitations, edema, dizziness, or syncope.   Received message from patient on 04/27/23 where patient reports 6 months of painful feet and ankle swelling, not relieved by ice, compression stockings, or ibuprofen. Also reports SOB, mainly with exercise/activity. No chest pain. Amlodipine stopped 05/01/23. Home BPs have been 150-160s/80s. Patient presents today for evaluation of swelling after stopping amlodipine.   At today's visit, patient reports swelling and SOB have much improved. She still does not feel 100% better, but she attributes general fatigue to a 77-month period of depression, resulting in not getting out of bed often, little appetite, and little physical activity. This has resulted in some weight gain. She has had several medications for her depression/mood adjusted recently.   Her home BPs have been consistently in the 150s/80s both while on amlodipine and after stopping last week. She reports a consistent headache that occurs around 12-1 PM each day, which is not  relieved by any OTC analgesic. It is not consistent with migraine.   Still seeing her psychologist and psychiatrist.  Discussed restarting hydrochlorothiazide today after repeating labs. Patient would like to restart hydrochlorothiazide as she believes it is one of the medications that offered complete BP control in the past. She attributes her syncopal event to a high-protein meal plan she was participating in and drinking much more water than she is used to, leading to electrolyte losses.   Current HTN meds: carvedilol 25 mg twice a day Previously tried: lisinopril (cough), losartan (cough), felodipine (increased flushing, malaise), HCTZ (syncope, worsening Scr that improved with discontinuation), metoprolol (switched to carvedilol for better BP lowering), hydralazine (nausea, abdominal pain), valsartan (ineffective), spironolactone (increased SCr and rash), amlodipine 5 mg (painful LEE, SOB) BP goal: <130/80 mmHg  Family History: HLD in mother/father/sister, CAD s/p CABG in father, HTN in sister  Social History: Former smoker (2.5 pack year history, quit 1989). Drinks alcohol occasionally in social settings. Denies drug use.   Diet: Tries not to add salt to foods. Drinks 1-2 cokes per day. Tries to avoid fried foods. Eats a lot of chicken, fish. Not much appetite in past 6 months but getting better.  Exercise: Walking 1 mile sometimes - energy levels haven't been great the past 6 months but she is making a conscious effort.    Wt Readings from Last 3 Encounters:  08/07/22 159 lb (72.1 kg)  04/30/21 150 lb (68  kg)  04/27/21 151 lb (68.5 kg)   BP Readings from Last 3 Encounters:  05/05/23 (!) 150/90  08/07/22 122/70  02/20/22 114/70   Pulse Readings from Last 3 Encounters:  05/05/23 69  08/07/22 76  02/20/22 68    Renal function: CrCl cannot be calculated (Patient's most recent lab result is older than the maximum 21 days allowed.).  Past Medical History:  Diagnosis Date    Anxiety    Asthma    Chronic kidney disease    Chronic kidney disease, stage 3 (HCC) 08/10/2019   Depression    Hyperlipidemia    Hypertension    Migraine     Current Outpatient Medications on File Prior to Visit  Medication Sig Dispense Refill   busPIRone (BUSPAR) 15 MG tablet Take 1 tablet (15 mg total) by mouth 2 (two) times daily. 180 tablet 1   carvedilol (COREG) 25 MG tablet TAKE 1 TABLET(25 MG) BY MOUTH TWICE DAILY WITH A MEAL 180 tablet 3   Cholecalciferol (VITAMIN D3) 50 MCG (2000 UT) capsule Take 1 tablet by mouth every morning.     Cyanocobalamin (VITAMIN B-12 PO) Take 1 tablet by mouth every morning.     escitalopram (LEXAPRO) 20 MG tablet Take 20 mg by mouth daily.     esomeprazole (NEXIUM) 20 MG capsule Take 20 mg by mouth as needed.     ibuprofen (ADVIL) 200 MG tablet Take 200 mg by mouth every 6 (six) hours as needed.     REPATHA SURECLICK 140 MG/ML SOAJ ADMINISTER 1 ML UNDER THE SKIN EVERY 14 DAYS 2 mL 11   rizatriptan (MAXALT) 10 MG tablet Take 10 mg by mouth See admin instructions. Take one tablet (10 mg) by mouth at onset of migraine headache, may repeat in 2 hours if still needed.     ALPRAZolam (XANAX) 1 MG tablet TAKE 1 TABLET AT BEDTIME AND 1/2-1 TABLET AS NEEDED FOR ANXIETY. 45 tablet 5   amLODipine (NORVASC) 5 MG tablet Take 1 tablet (5 mg total) by mouth daily. (Patient not taking: Reported on 05/05/2023) 90 tablet 3   DULoxetine (CYMBALTA) 30 MG capsule Take 1 capsule (30 mg total) by mouth daily. Take with a 60 mg capsule to equal total dose of 90 mg. 90 capsule 1   DULoxetine (CYMBALTA) 60 MG capsule Take 1 capsule (60 mg total) by mouth daily. 90 capsule 1   pramipexole (MIRAPEX) 0.5 MG tablet Take 1 tablet twice daily for one week, then take 1/2 tablet twice daily for one week, then stop 30 tablet 0   zaleplon (SONATA) 10 MG capsule Take 1 capsule (10 mg total) by mouth at bedtime as needed for sleep. 30 capsule 5   No current facility-administered medications  on file prior to visit.    Allergies  Allergen Reactions   Ace Inhibitors Cough   Ambien [Zolpidem] Other (See Comments)    Other reaction(s): headache/neck throbbing   Amoxicillin Other (See Comments)    Gi upset   Codeine Nausea And Vomiting    Can tolerate hydrocodone if has food on stomach     Cortisone Other (See Comments)    Extremely high BP- reaction to injection   Felodipine Other (See Comments)     increased flushing and made her feel bad   Hydralazine     nausea   Hydrocodone-Acetaminophen Nausea And Vomiting    Must eat before and after each dose   Lisinopril Cough    Dry cough   Losartan Potassium Cough  Nexletol [Bempedoic Acid] Other (See Comments)    Brain fog   Venlafaxine Other (See Comments)    headaches/elevated blood pressure   Zetia [Ezetimibe] Diarrhea   Crestor [Rosuvastatin] Rash    facial rash   Lamictal [Lamotrigine] Rash   Spironolactone Rash    Increased scr and rash   Statins Rash    Face and leg rash with rosuvastatin, pravastatin, and atorvastatin    Blood pressure (!) 150/90, pulse 69, last menstrual period 03/23/2011.   Assessment/Plan: HYPERTENSION CONTROL Vitals:   05/05/23 1547 05/05/23 1548  BP: (!) 154/82 (!) 150/90    The patient's blood pressure is elevated above target today. {Click here if intervention needs to be changed Refresh Note :1}  In order to address the patient's elevated BP: Labs and/or other diagnostics are currently pending prior to making blood pressure medication adjustments.      1. Hypertension -  Essential hypertension Assessment:  SOB and lower extremity edema x 6 months likely secondary to amlodipine use. Edema and SOB greatly improved after discontinuing amlodipine on 05/01/23.  Home BP elevated before and after stopping amlodipine, consistently 150s/80s - not at goal of < 130/80 mmHg Vitals today: BP 154/82, 150/90 - not at goal; HR 69 Reports daily headaches not relieved by OTC analgesics. No  dizziness or blurry vision.  Candidate for intensified therapy given discontinuation of amlodipine and elevated BP. Patient has indicated preference for retrial of hydrochlorothiazide over another agent.  Patient has been making a conscious effort to bounce back after 61-month depression spell and increasing physical activity with walking as well as eating a balanced diet.   Plan: BMET today. If both electrolytes and SCr WNL, will retrial hydrochlorothiazide at a lower dose of 12.5 mg and repeat labs in two weeks to ensure appropriateness of diuretic.  Recognized efforts to improve both emotional and physical health and encouraged patient to incorporate strength training/lifting weights in addition to aerobic activity to maintain muscle mass. Also encouraged to limit soda intake as to not unintentionally curb appetite with caffeine/liquid calories.  Thank you,  Lendon Ka, PharmD Candidate 2025 [address] [facility] 05/05/23  Olene Floss, Pharm.D, BCACP, CPP [facility] [address], [place], [place] [address]  Phone: [phone]; Fax: [phone]

## 2023-05-05 NOTE — Assessment & Plan Note (Addendum)
 Assessment:  SOB and lower extremity edema x 6 months likely secondary to amlodipine use. Edema and SOB greatly improved after discontinuing amlodipine on 05/01/23.  Home BP elevated before and after stopping amlodipine, consistently 150s/80s - not at goal of < 130/80 mmHg Vitals today: BP 154/82, 150/90 - not at goal; HR 69 Reports daily headaches not relieved by OTC analgesics. No dizziness or blurry vision.  Candidate for intensified therapy given discontinuation of amlodipine and elevated BP. Patient has indicated preference for retrial of hydrochlorothiazide over another agent.  Patient has been making a conscious effort to bounce back after 62-month depression spell and increasing physical activity with walking as well as eating a balanced diet.   Plan: BMET today. If both electrolytes and SCr WNL, will retrial hydrochlorothiazide at a lower dose of 12.5 mg and repeat labs in two weeks to ensure appropriateness of diuretic.  Recognized efforts to improve both emotional and physical health and encouraged patient to incorporate strength training/lifting weights in addition to aerobic activity to maintain muscle mass. Also encouraged to limit soda intake as to not unintentionally curb appetite with caffeine/liquid calories.

## 2023-05-06 ENCOUNTER — Telehealth: Payer: Self-pay

## 2023-05-06 DIAGNOSIS — I10 Essential (primary) hypertension: Secondary | ICD-10-CM

## 2023-05-06 LAB — BASIC METABOLIC PANEL WITH GFR
BUN/Creatinine Ratio: 14 (ref 12–28)
BUN: 14 mg/dL (ref 8–27)
CO2: 20 mmol/L (ref 20–29)
Calcium: 10.5 mg/dL — ABNORMAL HIGH (ref 8.7–10.3)
Chloride: 103 mmol/L (ref 96–106)
Creatinine, Ser: 1.02 mg/dL — ABNORMAL HIGH (ref 0.57–1.00)
Glucose: 93 mg/dL (ref 70–99)
Potassium: 4.8 mmol/L (ref 3.5–5.2)
Sodium: 138 mmol/L (ref 134–144)
eGFR: 62 mL/min/{1.73_m2} (ref >59)

## 2023-05-06 MED ORDER — HYDROCHLOROTHIAZIDE 12.5 MG PO CAPS: 12.5000 mg | ORAL_CAPSULE | Freq: Every day | ORAL | 3 refills | Status: AC

## 2023-05-06 NOTE — Telephone Encounter (Signed)
 Called patient to inform her of BMP lab results. Scr 1.02 however this appears to be her baseline. Calcium 10.5, slightly elevated from previous labs. All other electrolytes WNL.  Will retrial hydrochlorothiazide 12.5 mg and repeat BMP in two weeks. Follow-up in clinic planned in one month.  Thank you,  Lendon Ka, PharmD Candidate 2025 [facility] 05/06/23 14:31

## 2023-05-08 DIAGNOSIS — F4321 Adjustment disorder with depressed mood: Secondary | ICD-10-CM | POA: Diagnosis not present

## 2023-05-20 DIAGNOSIS — I10 Essential (primary) hypertension: Secondary | ICD-10-CM | POA: Diagnosis not present

## 2023-05-21 ENCOUNTER — Encounter: Payer: Self-pay | Admitting: Pharmacist

## 2023-05-21 LAB — BASIC METABOLIC PANEL WITH GFR
BUN/Creatinine Ratio: 11 — ABNORMAL LOW (ref 12–28)
BUN: 12 mg/dL (ref 8–27)
CO2: 25 mmol/L (ref 20–29)
Calcium: 9.8 mg/dL (ref 8.7–10.3)
Chloride: 101 mmol/L (ref 96–106)
Creatinine, Ser: 1.05 mg/dL — ABNORMAL HIGH (ref 0.57–1.00)
Glucose: 108 mg/dL — ABNORMAL HIGH (ref 70–99)
Potassium: 4 mmol/L (ref 3.5–5.2)
Sodium: 139 mmol/L (ref 134–144)
eGFR: 60 mL/min/{1.73_m2} (ref >59)

## 2023-05-22 DIAGNOSIS — J069 Acute upper respiratory infection, unspecified: Secondary | ICD-10-CM | POA: Diagnosis not present

## 2023-05-23 DIAGNOSIS — F4321 Adjustment disorder with depressed mood: Secondary | ICD-10-CM | POA: Diagnosis not present

## 2023-05-30 DIAGNOSIS — F4321 Adjustment disorder with depressed mood: Secondary | ICD-10-CM | POA: Diagnosis not present

## 2023-06-03 DIAGNOSIS — I129 Hypertensive chronic kidney disease with stage 1 through stage 4 chronic kidney disease, or unspecified chronic kidney disease: Secondary | ICD-10-CM | POA: Diagnosis not present

## 2023-06-03 DIAGNOSIS — Z23 Encounter for immunization: Secondary | ICD-10-CM | POA: Diagnosis not present

## 2023-06-03 DIAGNOSIS — E782 Mixed hyperlipidemia: Secondary | ICD-10-CM | POA: Diagnosis not present

## 2023-06-03 DIAGNOSIS — E538 Deficiency of other specified B group vitamins: Secondary | ICD-10-CM | POA: Diagnosis not present

## 2023-06-03 DIAGNOSIS — E559 Vitamin D deficiency, unspecified: Secondary | ICD-10-CM | POA: Diagnosis not present

## 2023-06-03 DIAGNOSIS — Z0000 Encounter for general adult medical examination without abnormal findings: Secondary | ICD-10-CM | POA: Diagnosis not present

## 2023-06-03 DIAGNOSIS — G43909 Migraine, unspecified, not intractable, without status migrainosus: Secondary | ICD-10-CM | POA: Diagnosis not present

## 2023-06-03 DIAGNOSIS — N1832 Chronic kidney disease, stage 3b: Secondary | ICD-10-CM | POA: Diagnosis not present

## 2023-06-04 ENCOUNTER — Ambulatory Visit: Attending: Cardiovascular Disease | Admitting: Pharmacist

## 2023-06-04 VITALS — BP 124/78 | HR 72

## 2023-06-04 DIAGNOSIS — I10 Essential (primary) hypertension: Secondary | ICD-10-CM

## 2023-06-04 NOTE — Progress Notes (Signed)
 Patient ID: Amanda [name]                 DOB: 1960-02-13                      MRN: [id number]      HPI: Amanda [name] is a 63 y.o. female referred by Dr. Abel Hoe to HTN clinic. PMH is significant for HTN, HLD, palpitations (PACs/PVCs), CKD3, asthma, GERD, and anxiety/depression.   At last pharmacy visit 02/20/22, patient brought in four BP readings that were fairly well controlled. Home cuff previously validated, BP 107-121/75-88 at home and 120/76 in clinic, HR 68. Had not been walking for exercise, patient reports not having much energy. Had red bumps (almost look like pimples) that come and go. Having cramping. Advised to see PCP. No dizziness/swelling at this visit. Continued carvedilol  25 mg twice a day and amlodipine  5mg  daily with follow up as needed.  At last cardiology visit on 08/07/22, BP was well controlled on carvedilol  25 mg BID and amlodipine  5 mg. BP 122/70 in office, HR 76. No symptoms of chest pain, dyspnea, palpitations, edema, dizziness, or syncope.   Received message from patient on 04/27/23 where patient reports 6 months of painful feet and ankle swelling, not relieved by ice, compression stockings, or ibuprofen. Also reports SOB, mainly with exercise/activity. No chest pain. Amlodipine  stopped 05/01/23. Home BPs were 150-160s/80s. At last visit 4/7, hydrochlorothiazide  12.5mg  was started. Follow up BMP was stable.   Still seeing her psychologist and psychiatrist.  Patient presents today for follow-up.  She had labs done with PCP yesterday Scr 1.13, K 4.2, Na 138 which is stable.  Reports that she had the flu a few weeks ago.  Still feels like she is recovering from this.  Had a few days of elevated blood pressure while she was on prednisone .  Overall home blood pressure has been well-controlled.  Swelling gone.  137/76, 118/75, 120/80, 135/73, 143/77, 157/94 (on prednisone ), 136/84, 148/83, 121/72, 114/72, 111/61, 124/74  Current HTN meds: carvedilol  25 mg twice a day,  hydrochlorothiazide  12.5mg  daily Previously tried: lisinopril  (cough), losartan (cough), felodipine (increased flushing, malaise), HCTZ (syncope, worsening Scr that improved with discontinuation), metoprolol  (switched to carvedilol  for better BP lowering), hydralazine  (nausea, abdominal pain), valsartan  (ineffective), spironolactone  (increased SCr and rash), amlodipine  5 mg (painful LEE, SOB) BP goal: <130/80 mmHg  Family History: HLD in mother/father/sister, CAD s/p CABG in father, HTN in sister  Social History: Former smoker (2.5 pack year history, quit 1989). Drinks alcohol occasionally in social settings. Denies drug use.   Diet: Tries not to add salt to foods. Drinks 1-2 cokes per day. Tries to avoid fried foods. Eats a lot of chicken, fish. Not much appetite in past 6 months but getting better.  Exercise: Walking 1 mile sometimes - energy levels haven't been great the past 6 months but she is making a conscious effort.    Wt Readings from Last 3 Encounters:  08/07/22 159 lb (72.1 kg)  04/30/21 150 lb (68 kg)  04/27/21 151 lb (68.5 kg)   BP Readings from Last 3 Encounters:  06/04/23 124/78  05/05/23 (!) 150/90  08/07/22 122/70   Pulse Readings from Last 3 Encounters:  06/04/23 72  05/05/23 69  08/07/22 76    Renal function: CrCl cannot be calculated (Unknown ideal weight.).  Past Medical History:  Diagnosis Date   Anxiety    Asthma    Chronic kidney disease    Chronic kidney disease,  stage 3 (HCC) 08/10/2019   Depression    Hyperlipidemia    Hypertension    Migraine     Current Outpatient Medications on File Prior to Visit  Medication Sig Dispense Refill   carvedilol  (COREG ) 25 MG tablet TAKE 1 TABLET(25 MG) BY MOUTH TWICE DAILY WITH A MEAL 180 tablet 3   hydrochlorothiazide  (MICROZIDE ) 12.5 MG capsule Take 1 capsule (12.5 mg total) by mouth daily. 90 capsule 3   REPATHA  SURECLICK 140 MG/ML SOAJ ADMINISTER 1 ML UNDER THE SKIN EVERY 14 DAYS 2 mL 11   busPIRone   (BUSPAR ) 15 MG tablet Take 1 tablet (15 mg total) by mouth 2 (two) times daily. 180 tablet 1   Cholecalciferol (VITAMIN D3) 50 MCG (2000 UT) capsule Take 1 tablet by mouth every morning.     Cyanocobalamin  (VITAMIN B-12 PO) Take 1 tablet by mouth every morning.     escitalopram  (LEXAPRO ) 20 MG tablet Take 20 mg by mouth daily.     esomeprazole  (NEXIUM ) 20 MG capsule Take 20 mg by mouth as needed.     ibuprofen (ADVIL) 200 MG tablet Take 200 mg by mouth every 6 (six) hours as needed.     rizatriptan (MAXALT) 10 MG tablet Take 10 mg by mouth See admin instructions. Take one tablet (10 mg) by mouth at onset of migraine headache, may repeat in 2 hours if still needed.     No current facility-administered medications on file prior to visit.    Allergies  Allergen Reactions   Ace Inhibitors Cough   Ambien [Zolpidem] Other (See Comments)    Other reaction(s): headache/neck throbbing   Amoxicillin Other (See Comments)    Gi upset   Codeine Nausea And Vomiting    Can tolerate hydrocodone if has food on stomach     Cortisone Other (See Comments)    Extremely high BP- reaction to injection   Felodipine Other (See Comments)     increased flushing and made her feel bad   Hydralazine      nausea   Hydrocodone-Acetaminophen  Nausea And Vomiting    Must eat before and after each dose   Lisinopril  Cough    Dry cough   Losartan Potassium Cough   Nexletol  [Bempedoic Acid ] Other (See Comments)    Brain fog   Venlafaxine Other (See Comments)    headaches/elevated blood pressure   Zetia  [Ezetimibe ] Diarrhea   Crestor [Rosuvastatin] Rash    facial rash   Lamictal  [Lamotrigine ] Rash   Spironolactone  Rash    Increased scr and rash   Statins Rash    Face and leg rash with rosuvastatin, pravastatin , and atorvastatin    Blood pressure 124/78, pulse 72, last menstrual period 03/23/2011.   Assessment/Plan:     1. Hypertension -  Essential hypertension Assessment: Blood pressure  well-controlled in clinic today Home blood pressures mostly at goal as well Labs stable on HCTZ 12.5  Plan: Continue carvedilol  25 mg twice a day and HCTZ 12.5 mg daily Follow-up as needed   Thank you,  Rogerio Boutelle D Harumi Yamin, Pharm.Monika Annas, CPP [facility] A Division of [facility] [address] [place], [place] [address]  Phone: [phone]; Fax: [phone]

## 2023-06-04 NOTE — Patient Instructions (Signed)
 Your blood pressure goal is < 130/86mmHg   Continue carvedilol  25 mg twice a day, hydrochlorothiazide  12.5mg  daily  Important lifestyle changes to control high blood pressure  Intervention  Effect on the BP   Weight loss Weight loss is one of the most effective lifestyle changes for controlling blood pressure. If you're overweight or obese, losing even a small amount of weight can help reduce blood pressure.    Blood pressure can decrease by 1 millimeter of mercury (mmHg) with each kilogram (about 2.2 pounds) of weight lost.   Exercise regularly As a general goal, aim for 30 minutes of moderate physical activity every day.    Regular physical activity can lower blood pressure by 5 - 8 mmHg.   Eat a healthy diet Eat a diet rich in whole grains, fruits, vegetables, lean meat, and low-fat dairy products. Limit processed foods, saturated fat, and sweets.    A heart-healthy diet can lower high blood pressure by 10 mmHg.   Reduce salt (sodium) in your diet Aim for 000mg  of sodium each day. Avoid deli meats, canned food, and frozen microwave meals which are high in sodium.     Limiting sodium can reduce blood pressure by 5 mmHg.   Limit alcohol One drink equals 12 ounces of beer, 5 ounces of wine, or 1.5 ounces of 80-proof liquor.    Limiting alcohol to < 1 drink a day for women or < 2 drinks a day for men can help lower blood pressure by about 4 mmHg.   To check your pressure at home you will need to:   Sit up in a chair, with feet flat on the floor and back supported. Do not cross your ankles or legs. Rest your left arm so that the cuff is about heart level. If the cuff goes on your upper arm, then just relax your arm on the table, arm of the chair, or your lap. If you have a wrist cuff, hold your wrist against your chest at heart level. Place the cuff snugly around your arm, about 1 inch above the crease of your elbow. The cords should be inside the groove of your elbow.  Sit  quietly, with the cuff in place, for about 5 minutes. Then press the power button to start a reading. Do not talk or move while the reading is taking place.  Record your readings on a sheet of paper. Although most cuffs have a memory, it is often easier to see a pattern developing when the numbers are all in front of you.  You can repeat the reading after 1-3 minutes if it is recommended.   Make sure your bladder is empty and you have not had caffeine or tobacco within the last 30 minutes   Always bring your blood pressure log with you to your appointments. If you have not brought your monitor in to be double checked for accuracy, please bring it to your next appointment.   You can find a list of validated (accurate) blood pressure cuffs at: validatebp.org

## 2023-06-04 NOTE — Assessment & Plan Note (Signed)
 Assessment: Blood pressure well-controlled in clinic today Home blood pressures mostly at goal as well Labs stable on HCTZ 12.5  Plan: Continue carvedilol  25 mg twice a day and HCTZ 12.5 mg daily Follow-up as needed

## 2023-06-06 DIAGNOSIS — F4321 Adjustment disorder with depressed mood: Secondary | ICD-10-CM | POA: Diagnosis not present

## 2023-06-12 ENCOUNTER — Encounter: Payer: Self-pay | Admitting: Cardiovascular Disease

## 2023-06-12 DIAGNOSIS — Z8489 Family history of other specified conditions: Secondary | ICD-10-CM

## 2023-06-24 DIAGNOSIS — F331 Major depressive disorder, recurrent, moderate: Secondary | ICD-10-CM | POA: Diagnosis not present

## 2023-06-24 DIAGNOSIS — F411 Generalized anxiety disorder: Secondary | ICD-10-CM | POA: Diagnosis not present

## 2023-06-24 DIAGNOSIS — G4700 Insomnia, unspecified: Secondary | ICD-10-CM | POA: Diagnosis not present

## 2023-06-30 DIAGNOSIS — F4321 Adjustment disorder with depressed mood: Secondary | ICD-10-CM | POA: Diagnosis not present

## 2023-07-07 DIAGNOSIS — F4321 Adjustment disorder with depressed mood: Secondary | ICD-10-CM | POA: Diagnosis not present

## 2023-07-08 ENCOUNTER — Ambulatory Visit: Attending: Genetic Counselor | Admitting: Genetic Counselor

## 2023-07-15 NOTE — Progress Notes (Signed)
 Pre Test Genetic Consult  Referral Reason  Amanda [name] was referred for genetic consult of a familial TTN (gene variant that was detected in a relative with heart failure  Personal Medical Information   Amanda [name] (III.4 on pedigree) is a pleasant 63 y.o. Caucasian woman whose recent echocardiogram (04/17/21) demonstrated normal wall thickness and function. She is asymptomatic.   Her sister's grandson was found to have heart failure 2 months after his birth. 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 She was advised to undergo genetic testing for this variant to confirm if she harbors this variant.  Family History  Amanda [name] does not have biological kids. She has adopted a daughter from [place]. Her sister (III.3) is asymptomatic and harbors the TTN variant that was detected in her grandson, Amanda [name] (V.5) with heart failure that he inherited from his father (IV.4). Her daughter has not yet undergone genetic testing for this variant. Her son's older son, age 6 (V.4) does not have the familial TTN variant.    Test Report Genetic testing (Lab unknown- have copy of genetic consult letter from genetic counselor at [facility]) detected a heterozygous variant in TTN gene. This note also includes the re-interpretation of the genetic test result.  The TTN gene encodes titin, a large sarcomeric protein that serves as an adhesion template for the assembly of contractile machinery in muscle cells.   TTN gene encodes 364 exons that undergo extensive alternate splicing to produce several TTN isoforms ranging in length from 5604 to 34,350 amino acids. In the adult myocardium, isoform N2BA and N2B are the two major full-length titin isoforms that are robustly expressed, in addition to low abundance short Novex isoforms. Heart muscles co-express 2970-kDa N2B titin (B) and 3300-kDa N2BA titin as major isoforms. In addition, novex-1/N2B and novex-2/N2B isoforms are expressed as  minor heart-specific isoforms.   The TTN c.49346-1G>A, (p.Asp16449GlufsTer2) variant is an intronic variant in the splice acceptor consensus sequence that has been shown to cause exon skipping Amanda [name] et al., 2022) by RNA seq studies from peripheral blood cells. However, protein levels of titin were not assessed in this study. Typically, protein truncating variants are classified as pathogenic as these are considered loss of function mutations.   However, TTN gene is unique, in that TTN truncating variants (TTNtv) are observed the general population; nearly 2% of individuals without overt cardiomyopathy harbor  a TTN truncating variant. It is currently thought that TTNtv that reside towards the carboxyl-terminus of titin protein and localize in the sarcomere A-band, truncates the two principal isoforms of TTN (TTN N2B and TTN N2BA). A large number of pathogenic truncating variants in the A band have been linked to DCM with severely impaired LV function and life-threatening ventricular arrhythmias. Truncating variants of TTN (TTNtv) especially in the A-band region account for 20% of dilated cardiomyopathy (DCM) cases.   While this variant is located in the in the sarcomeric A-band region, it has been reported in only 2 patients with DCM Amanda [name] et al., 2015; Amanda [name] et al., 2015) and demonstrates clinical variability as it has been seen in one patient with Afib and mitral valve prolapse (Amanda [name] et al., 2022). This TTN gene variant is rare with a FAF- 2x 10-5, an allele frequency lower than expected for a pathogenic variant. In addition, clinical interpretation of TTN truncating variants is a challenge due to the enormous size of the protein and the high frequency of truncating variants in the general population.    Truncating mutations in  TTN gene have been reported in in 27% cases of familial adult-onset DCM. However, the prevalence of causal TTN gene truncating variants in children under the age of 42 is very  low and on par of that seen in the general population (Amanda [name] et al., 2016, Amanda [name] et al., 2014). It has been recommended by Amanda [name] al., 2016 that the "the discovery of a TTNtv in a child with DCM should prompt a search for another cardiomyopathy gene variant and/or acquired DCM risk factors."   Thus, considering the paucity of data linking TTN truncating variants with that HF in children below the age of 14 and in the absence of functional data or family members with HF to demonstrate segregation with disease, the TTN c.49346-1G>A, (p.Asp16449GlufsTer2) variant is reclassified as a variant of unknown clinical significance and NOT pathogenic.   Impression and Plan Therefore, as TTN c.49346-1G>A, (p.Asp16449GlufsTer2) variant is interpreted as variant of unknown clinical significance, genetic testing family members is unwarranted at this time. I have requested that Amanda [name] send me a copy of the test report so I can evaluate the genes that have been included in this test.  I also reviewed the protections afforded by Genetic Information Non-Discrimination Act (GINA).  I explained to the patient that GINA protects them from losing their employment or health insurance based on their genotype. However, these protections do not cover life insurance and disability. Patient verbalized understanding of this.   Amanda [name], Ph.D, [facility] Clinical Molecular Geneticist

## 2023-07-18 DIAGNOSIS — F4321 Adjustment disorder with depressed mood: Secondary | ICD-10-CM | POA: Diagnosis not present

## 2023-07-29 DIAGNOSIS — F4321 Adjustment disorder with depressed mood: Secondary | ICD-10-CM | POA: Diagnosis not present

## 2023-08-04 ENCOUNTER — Other Ambulatory Visit (HOSPITAL_COMMUNITY): Payer: Self-pay

## 2023-08-04 ENCOUNTER — Telehealth: Payer: Self-pay

## 2023-08-04 NOTE — Telephone Encounter (Signed)
 Pharmacy Patient Advocate Encounter   Received notification from Fax that prior authorization for REPATHA  is required/requested.   Insurance verification completed.   The patient is insured through [facility] .   Per test claim: PA required; PA submitted to above mentioned insurance via CoverMyMeds Key/confirmation #/EOC [id number] Status is pending

## 2023-08-05 ENCOUNTER — Other Ambulatory Visit (HOSPITAL_COMMUNITY): Payer: Self-pay

## 2023-08-05 NOTE — Telephone Encounter (Signed)
 Pharmacy Patient Advocate Encounter  Received notification from [facility] that Prior Authorization for REPATHA  has been APPROVED from 08/04/23 to 08/03/24. Ran test claim, Copay is $45. This test claim was processed through [facility]- copay amounts may vary at other pharmacies due to pharmacy/plan contracts, or as the patient moves through the different stages of their insurance plan.

## 2023-08-07 DIAGNOSIS — F4321 Adjustment disorder with depressed mood: Secondary | ICD-10-CM | POA: Diagnosis not present

## 2023-08-14 DIAGNOSIS — F4321 Adjustment disorder with depressed mood: Secondary | ICD-10-CM | POA: Diagnosis not present

## 2023-08-14 DIAGNOSIS — N390 Urinary tract infection, site not specified: Secondary | ICD-10-CM | POA: Diagnosis not present

## 2023-09-23 DIAGNOSIS — G4700 Insomnia, unspecified: Secondary | ICD-10-CM | POA: Diagnosis not present

## 2023-09-23 DIAGNOSIS — F331 Major depressive disorder, recurrent, moderate: Secondary | ICD-10-CM | POA: Diagnosis not present

## 2023-09-23 DIAGNOSIS — F411 Generalized anxiety disorder: Secondary | ICD-10-CM | POA: Diagnosis not present

## 2023-10-03 ENCOUNTER — Other Ambulatory Visit: Payer: Self-pay | Admitting: Cardiovascular Disease

## 2023-10-20 DIAGNOSIS — F411 Generalized anxiety disorder: Secondary | ICD-10-CM | POA: Diagnosis not present

## 2023-10-20 DIAGNOSIS — F39 Unspecified mood [affective] disorder: Secondary | ICD-10-CM | POA: Diagnosis not present

## 2023-10-20 DIAGNOSIS — G4700 Insomnia, unspecified: Secondary | ICD-10-CM | POA: Diagnosis not present

## 2023-12-02 ENCOUNTER — Other Ambulatory Visit: Payer: Self-pay | Admitting: Cardiovascular Disease

## 2023-12-04 DIAGNOSIS — I129 Hypertensive chronic kidney disease with stage 1 through stage 4 chronic kidney disease, or unspecified chronic kidney disease: Secondary | ICD-10-CM | POA: Diagnosis not present

## 2023-12-04 DIAGNOSIS — G43909 Migraine, unspecified, not intractable, without status migrainosus: Secondary | ICD-10-CM | POA: Diagnosis not present

## 2023-12-04 DIAGNOSIS — E538 Deficiency of other specified B group vitamins: Secondary | ICD-10-CM | POA: Diagnosis not present

## 2023-12-04 DIAGNOSIS — E782 Mixed hyperlipidemia: Secondary | ICD-10-CM | POA: Diagnosis not present

## 2023-12-04 DIAGNOSIS — E559 Vitamin D deficiency, unspecified: Secondary | ICD-10-CM | POA: Diagnosis not present

## 2023-12-04 DIAGNOSIS — Z23 Encounter for immunization: Secondary | ICD-10-CM | POA: Diagnosis not present

## 2023-12-04 DIAGNOSIS — N1832 Chronic kidney disease, stage 3b: Secondary | ICD-10-CM | POA: Diagnosis not present

## 2023-12-16 DIAGNOSIS — F411 Generalized anxiety disorder: Secondary | ICD-10-CM | POA: Diagnosis not present

## 2023-12-16 DIAGNOSIS — G4700 Insomnia, unspecified: Secondary | ICD-10-CM | POA: Diagnosis not present

## 2023-12-16 DIAGNOSIS — F3340 Major depressive disorder, recurrent, in remission, unspecified: Secondary | ICD-10-CM | POA: Diagnosis not present

## 2023-12-18 IMAGING — MG MM DIGITAL SCREENING BILAT W/ TOMO AND CAD
6 of 10 series · 6 of 30 positions shown · non-contrast
Comparison: Previous exam(s).

CLINICAL DATA: Screening.

EXAM:
DIGITAL SCREENING BILATERAL MAMMOGRAM WITH TOMOSYNTHESIS AND CAD
TECHNIQUE: Bilateral screening digital craniocaudal and mediolateral oblique
mammograms were obtained. Bilateral screening digital breast
tomosynthesis was performed. The images were evaluated with
computer-aided detection.

[R CC synth-2D]
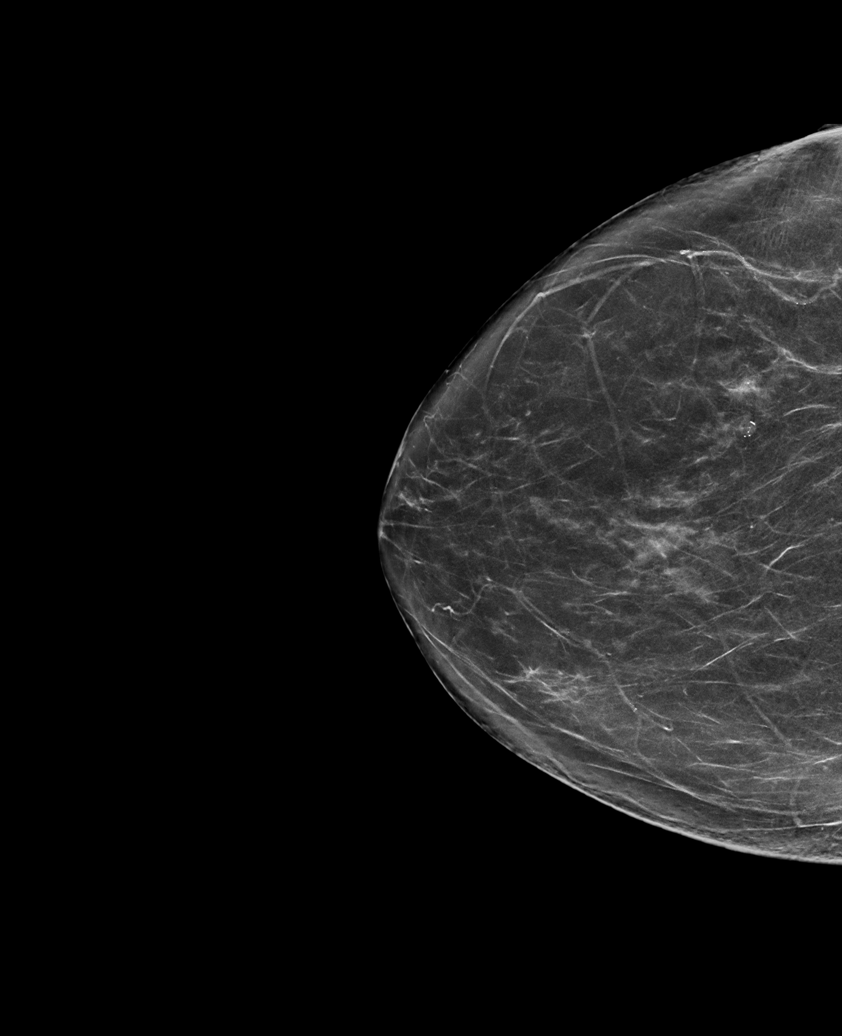

[R MLO synth-2D]
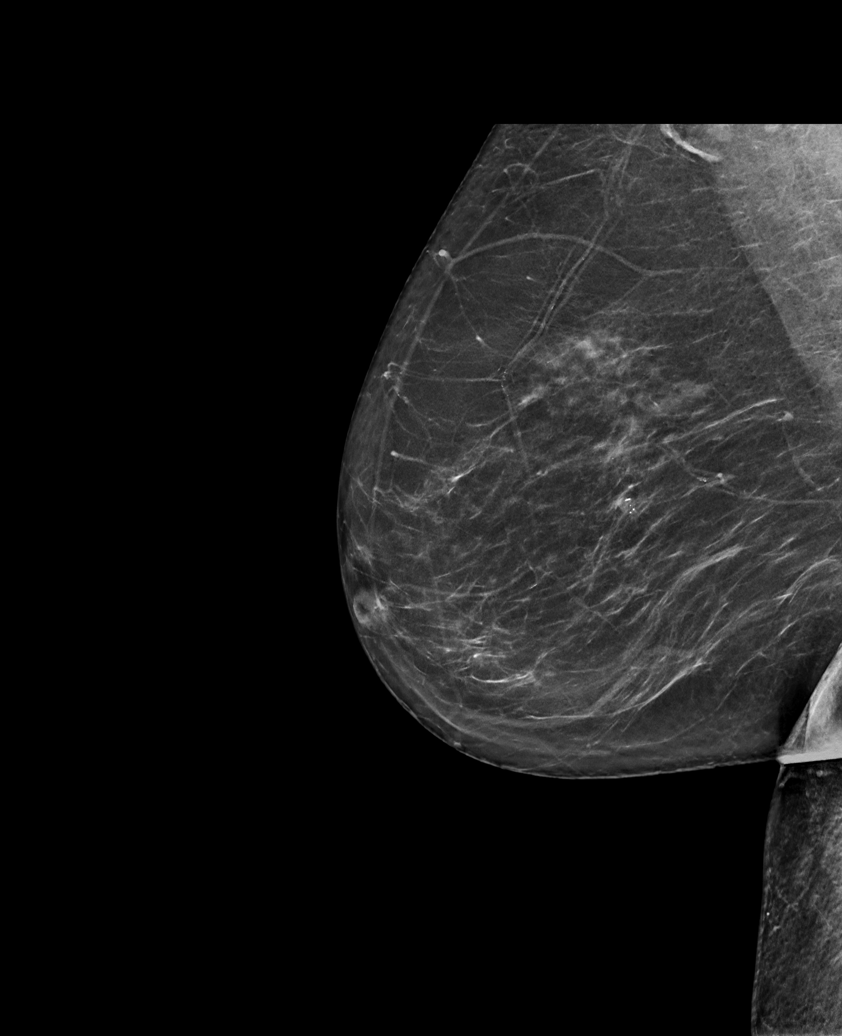

[L MLO synth-2D (1 of 2)]
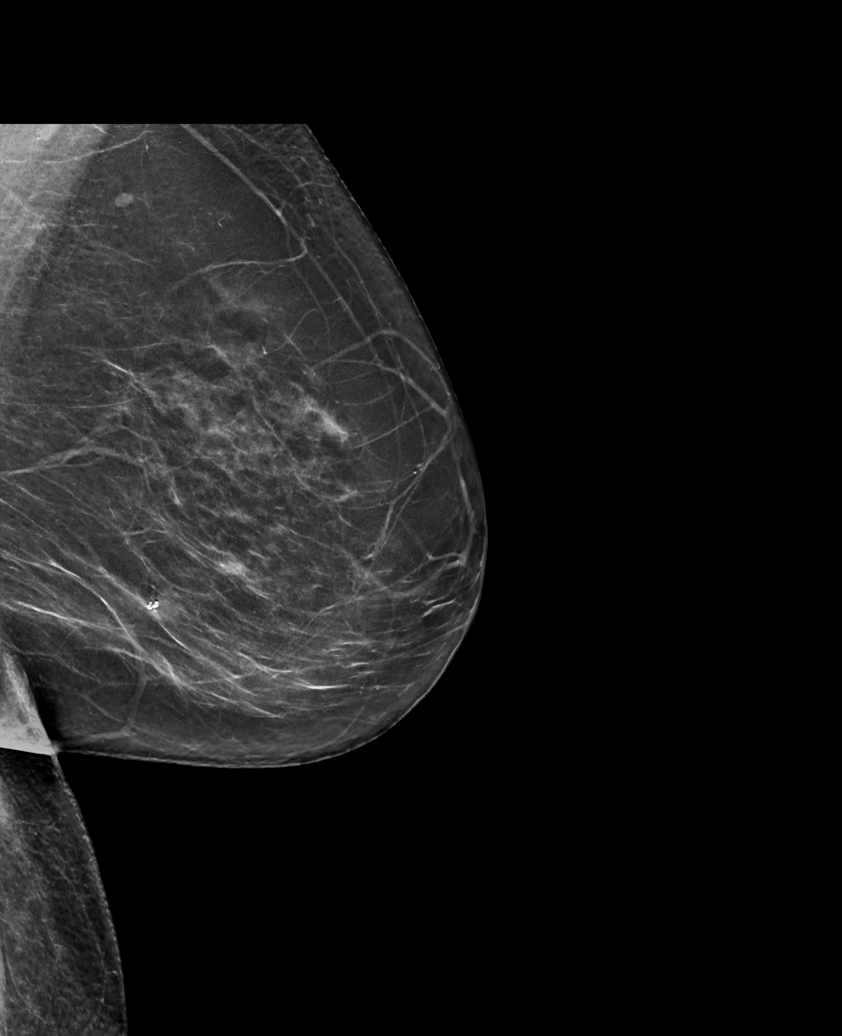

[L CC synth-2D]
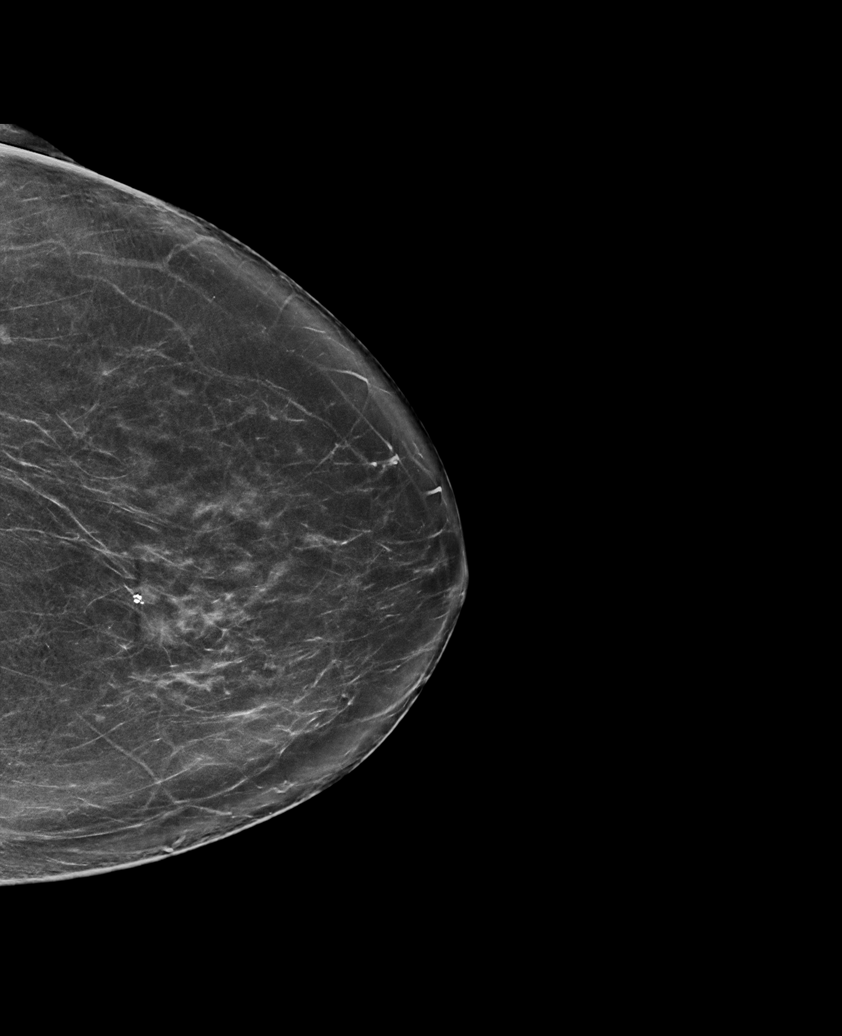

[L MLO synth-2D (2 of 2)]
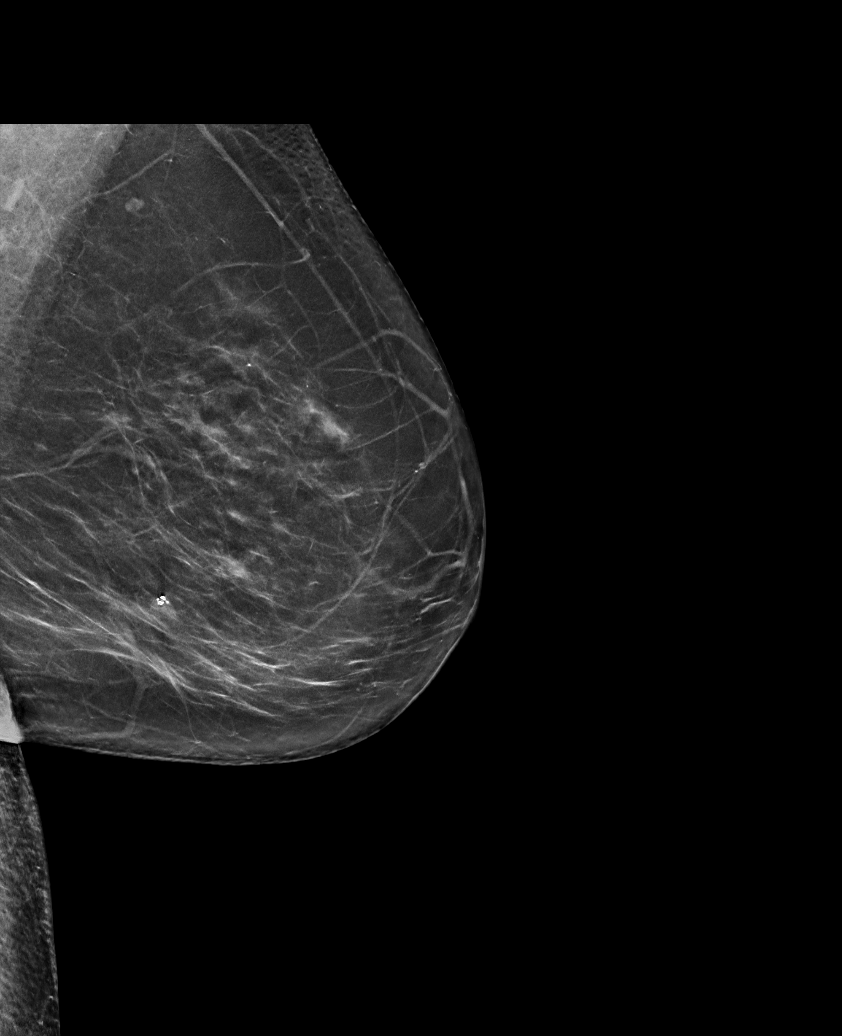

[L MLO tomo · tomo slice 35/70.0]
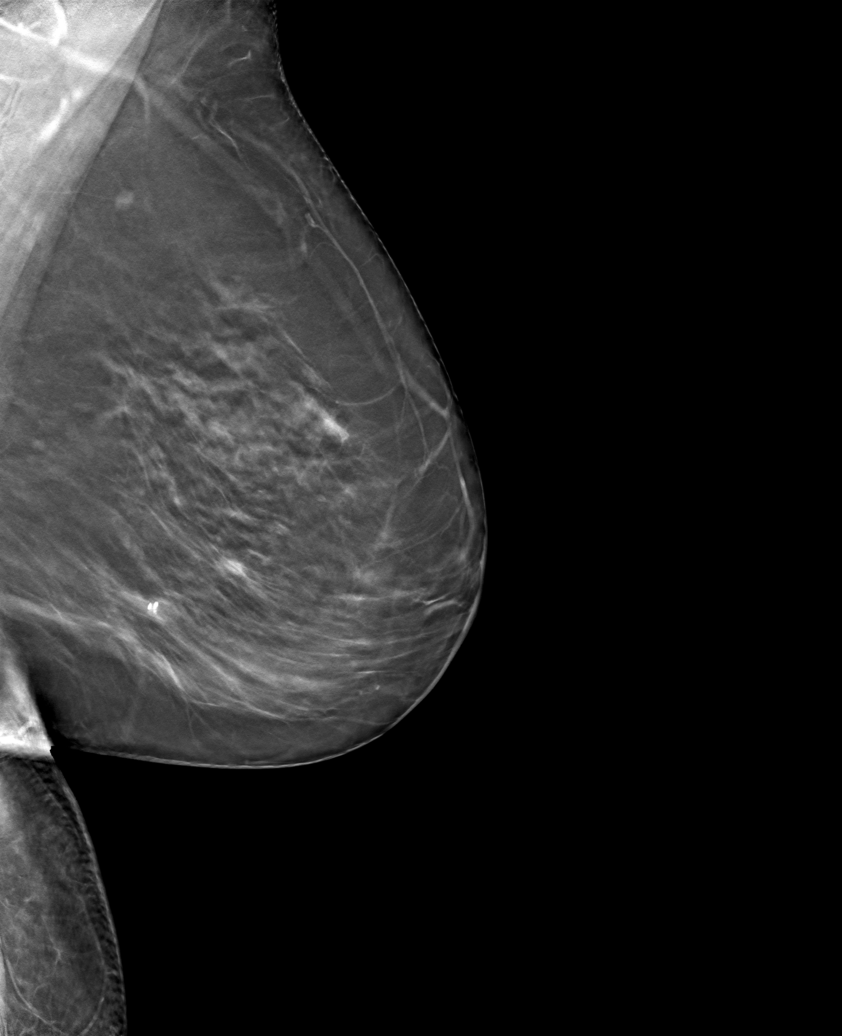

[6 of 30 positions shown; findings below may reference images not displayed]

ACR Breast Density Category b: There are scattered areas of
fibroglandular density.
FINDINGS: There are no findings suspicious for malignancy.
IMPRESSION: No mammographic evidence of malignancy. A result letter of this
screening mammogram will be mailed directly to the patient.

RECOMMENDATION:
Screening mammogram in one year. (Code:51-O-LD2)

BI-RADS CATEGORY  1: Negative.

## 2024-01-01 DIAGNOSIS — J029 Acute pharyngitis, unspecified: Secondary | ICD-10-CM | POA: Diagnosis not present

## 2024-01-01 DIAGNOSIS — R509 Fever, unspecified: Secondary | ICD-10-CM | POA: Diagnosis not present

## 2024-01-01 DIAGNOSIS — R051 Acute cough: Secondary | ICD-10-CM | POA: Diagnosis not present

## 2024-01-01 DIAGNOSIS — R0981 Nasal congestion: Secondary | ICD-10-CM | POA: Diagnosis not present

## 2024-01-11 DIAGNOSIS — J40 Bronchitis, not specified as acute or chronic: Secondary | ICD-10-CM | POA: Diagnosis not present

## 2024-01-11 DIAGNOSIS — R509 Fever, unspecified: Secondary | ICD-10-CM | POA: Diagnosis not present

## 2024-01-11 DIAGNOSIS — R051 Acute cough: Secondary | ICD-10-CM | POA: Diagnosis not present

## 2024-01-18 ENCOUNTER — Other Ambulatory Visit: Payer: Self-pay | Admitting: Cardiovascular Disease
